# Patient Record
Sex: Female | Born: 1945
Health system: Southern US, Community
[De-identification: ages and names within clinical notes are randomized; demographics above are authoritative.]

## PROBLEM LIST (undated history)

## (undated) ENCOUNTER — Emergency Department (HOSPITAL_BASED_OUTPATIENT_CLINIC_OR_DEPARTMENT_OTHER): Admission: EM

## (undated) DIAGNOSIS — Z720 Tobacco use: Secondary | ICD-10-CM

## (undated) DIAGNOSIS — M545 Low back pain, unspecified: Secondary | ICD-10-CM

## (undated) DIAGNOSIS — L732 Hidradenitis suppurativa: Secondary | ICD-10-CM

## (undated) DIAGNOSIS — M1990 Unspecified osteoarthritis, unspecified site: Secondary | ICD-10-CM

## (undated) DIAGNOSIS — E7800 Pure hypercholesterolemia, unspecified: Secondary | ICD-10-CM

## (undated) DIAGNOSIS — I2510 Atherosclerotic heart disease of native coronary artery without angina pectoris: Secondary | ICD-10-CM

## (undated) DIAGNOSIS — I10 Essential (primary) hypertension: Secondary | ICD-10-CM

## (undated) DIAGNOSIS — E669 Obesity, unspecified: Secondary | ICD-10-CM

## (undated) DIAGNOSIS — I700 Atherosclerosis of aorta: Secondary | ICD-10-CM

## (undated) DIAGNOSIS — K219 Gastro-esophageal reflux disease without esophagitis: Secondary | ICD-10-CM

## (undated) DIAGNOSIS — C801 Malignant (primary) neoplasm, unspecified: Secondary | ICD-10-CM

## (undated) DIAGNOSIS — E119 Type 2 diabetes mellitus without complications: Secondary | ICD-10-CM

## (undated) DIAGNOSIS — E785 Hyperlipidemia, unspecified: Secondary | ICD-10-CM

## (undated) DIAGNOSIS — R0600 Dyspnea, unspecified: Secondary | ICD-10-CM

## (undated) DIAGNOSIS — G8929 Other chronic pain: Secondary | ICD-10-CM

## (undated) DIAGNOSIS — G4730 Sleep apnea, unspecified: Secondary | ICD-10-CM

## (undated) HISTORY — DX: Gastro-esophageal reflux disease without esophagitis: K21.9

## (undated) HISTORY — DX: Obesity, unspecified: E66.9

## (undated) HISTORY — DX: Unspecified osteoarthritis, unspecified site: M19.90

## (undated) HISTORY — PX: HERNIA REPAIR: SHX51

## (undated) HISTORY — DX: Pure hypercholesterolemia, unspecified: E78.00

## (undated) HISTORY — DX: Hidradenitis suppurativa: L73.2

## (undated) HISTORY — DX: Essential (primary) hypertension: I10

## (undated) HISTORY — DX: Type 2 diabetes mellitus without complications: E11.9

## (undated) HISTORY — DX: Hyperlipidemia, unspecified: E78.5

## (undated) HISTORY — DX: Atherosclerosis of aorta: I70.0

## (undated) HISTORY — DX: Tobacco use: Z72.0

---
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
## 1999-11-08 ENCOUNTER — Ambulatory Visit (HOSPITAL_COMMUNITY): Admission: RE | Admit: 1999-11-08 | Discharge: 1999-11-08 | Payer: Self-pay | Admitting: Family Medicine

## 1999-11-08 ENCOUNTER — Encounter: Payer: Self-pay | Admitting: Family Medicine

## 1999-11-14 ENCOUNTER — Encounter: Payer: Self-pay | Admitting: Family Medicine

## 1999-11-14 ENCOUNTER — Encounter: Admission: RE | Admit: 1999-11-14 | Discharge: 1999-11-14 | Payer: Self-pay | Admitting: Family Medicine

## 2001-05-19 ENCOUNTER — Encounter: Admission: RE | Admit: 2001-05-19 | Discharge: 2001-05-19 | Payer: Self-pay | Admitting: Family Medicine

## 2001-05-19 ENCOUNTER — Encounter: Payer: Self-pay | Admitting: Family Medicine

## 2001-06-01 ENCOUNTER — Ambulatory Visit (HOSPITAL_COMMUNITY): Admission: RE | Admit: 2001-06-01 | Discharge: 2001-06-01 | Payer: Self-pay | Admitting: Family Medicine

## 2001-06-01 ENCOUNTER — Encounter: Payer: Self-pay | Admitting: Family Medicine

## 2001-07-12 ENCOUNTER — Emergency Department (HOSPITAL_COMMUNITY): Admission: EM | Admit: 2001-07-12 | Discharge: 2001-07-13 | Payer: Self-pay

## 2001-12-08 ENCOUNTER — Other Ambulatory Visit: Admission: RE | Admit: 2001-12-08 | Discharge: 2001-12-08 | Payer: Self-pay | Admitting: Family Medicine

## 2004-03-27 ENCOUNTER — Other Ambulatory Visit: Admission: RE | Admit: 2004-03-27 | Discharge: 2004-03-27 | Payer: Self-pay | Admitting: Family Medicine

## 2005-12-04 ENCOUNTER — Other Ambulatory Visit: Admission: RE | Admit: 2005-12-04 | Discharge: 2005-12-04 | Payer: Self-pay | Admitting: Family Medicine

## 2005-12-23 ENCOUNTER — Encounter: Admission: RE | Admit: 2005-12-23 | Discharge: 2005-12-23 | Payer: Self-pay | Admitting: Family Medicine

## 2010-05-03 ENCOUNTER — Other Ambulatory Visit: Payer: Self-pay | Admitting: Family Medicine

## 2010-05-03 ENCOUNTER — Other Ambulatory Visit (HOSPITAL_COMMUNITY)
Admission: RE | Admit: 2010-05-03 | Discharge: 2010-05-03 | Disposition: A | Discharge status: Home / Self Care | Payer: Medicare Other | Source: Ambulatory Visit | Attending: Family Medicine | Admitting: Family Medicine

## 2010-05-03 DIAGNOSIS — Z124 Encounter for screening for malignant neoplasm of cervix: Secondary | ICD-10-CM | POA: Insufficient documentation

## 2010-07-08 ENCOUNTER — Other Ambulatory Visit: Payer: Self-pay | Admitting: Gastroenterology

## 2010-11-09 ENCOUNTER — Emergency Department (HOSPITAL_COMMUNITY)
Admission: EM | Admit: 2010-11-09 | Discharge: 2010-11-10 | Disposition: A | Discharge status: Home / Self Care | Payer: Medicare Other | Attending: Emergency Medicine | Admitting: Emergency Medicine

## 2010-11-09 DIAGNOSIS — M79609 Pain in unspecified limb: Secondary | ICD-10-CM | POA: Insufficient documentation

## 2010-11-09 DIAGNOSIS — Z79899 Other long term (current) drug therapy: Secondary | ICD-10-CM | POA: Insufficient documentation

## 2010-11-09 DIAGNOSIS — E7800 Pure hypercholesterolemia, unspecified: Secondary | ICD-10-CM | POA: Insufficient documentation

## 2010-11-09 DIAGNOSIS — I10 Essential (primary) hypertension: Secondary | ICD-10-CM | POA: Insufficient documentation

## 2010-11-10 LAB — D-DIMER, QUANTITATIVE (NOT AT ARMC): D-Dimer, Quant: 0.33 ug/mL-FEU (ref 0.00–0.48)

## 2011-02-14 ENCOUNTER — Ambulatory Visit
Admission: RE | Admit: 2011-02-14 | Discharge: 2011-02-14 | Disposition: A | Discharge status: Home / Self Care | Payer: Medicare Other | Source: Ambulatory Visit | Attending: Family Medicine | Admitting: Family Medicine

## 2011-02-14 ENCOUNTER — Other Ambulatory Visit: Payer: Self-pay | Admitting: Family Medicine

## 2011-02-14 DIAGNOSIS — M545 Low back pain, unspecified: Secondary | ICD-10-CM

## 2011-03-06 ENCOUNTER — Ambulatory Visit: Payer: Medicare Other | Attending: Family Medicine

## 2011-03-06 DIAGNOSIS — IMO0001 Reserved for inherently not codable concepts without codable children: Secondary | ICD-10-CM | POA: Insufficient documentation

## 2011-03-06 DIAGNOSIS — R5381 Other malaise: Secondary | ICD-10-CM | POA: Insufficient documentation

## 2011-03-06 DIAGNOSIS — M545 Low back pain, unspecified: Secondary | ICD-10-CM | POA: Insufficient documentation

## 2011-03-13 ENCOUNTER — Ambulatory Visit: Payer: Medicare Other | Attending: Family Medicine

## 2011-03-13 DIAGNOSIS — R5381 Other malaise: Secondary | ICD-10-CM | POA: Insufficient documentation

## 2011-03-13 DIAGNOSIS — M545 Low back pain, unspecified: Secondary | ICD-10-CM | POA: Insufficient documentation

## 2011-03-13 DIAGNOSIS — IMO0001 Reserved for inherently not codable concepts without codable children: Secondary | ICD-10-CM | POA: Insufficient documentation

## 2011-03-26 ENCOUNTER — Encounter: Payer: Medicare Other | Admitting: Physical Therapy

## 2011-06-12 ENCOUNTER — Other Ambulatory Visit: Payer: Self-pay | Admitting: Family Medicine

## 2011-06-12 DIAGNOSIS — Z1231 Encounter for screening mammogram for malignant neoplasm of breast: Secondary | ICD-10-CM

## 2011-06-16 ENCOUNTER — Encounter (INDEPENDENT_AMBULATORY_CARE_PROVIDER_SITE_OTHER): Payer: Self-pay | Admitting: General Surgery

## 2011-06-17 ENCOUNTER — Ambulatory Visit (INDEPENDENT_AMBULATORY_CARE_PROVIDER_SITE_OTHER): Payer: Medicare Other | Admitting: General Surgery

## 2011-06-18 ENCOUNTER — Ambulatory Visit (INDEPENDENT_AMBULATORY_CARE_PROVIDER_SITE_OTHER): Payer: Medicare Other | Admitting: General Surgery

## 2011-06-18 ENCOUNTER — Encounter (INDEPENDENT_AMBULATORY_CARE_PROVIDER_SITE_OTHER): Payer: Self-pay | Admitting: General Surgery

## 2011-06-18 VITALS — BP 140/74 | HR 78 | Temp 98.3°F | Ht 65.5 in | Wt 223.2 lb

## 2011-06-18 DIAGNOSIS — K439 Ventral hernia without obstruction or gangrene: Secondary | ICD-10-CM

## 2011-06-18 DIAGNOSIS — R109 Unspecified abdominal pain: Secondary | ICD-10-CM

## 2011-06-18 NOTE — Progress Notes (Signed)
Addended by: Andrey Campanile, Akshitha Culmer M on: 06/18/2011 10:30 AM   Modules accepted: Orders

## 2011-06-18 NOTE — Progress Notes (Signed)
Patient ID: Kathryn Bruce, female   DOB: 01-Oct-1945, 66 y.o.   MRN: [id number]  Chief Complaint  Patient presents with  . Pre-op Exam    eval abd wall hernia    HPI Kathryn Bruce is a 66 y.o. female.   HPI This 66 year old obese Caucasian female referred by Dr. Kevan Ny for evaluation of a large ventral hernia. The patient states that she's had a hernia in this area for about 10 years. It did not bother her until about 3 weeks ago. She awokei in the middle the night with a painful lump above her umbilicus. It was tender. She denies any nausea, vomiting, diarrhea, or constipation. She went to see her primary care physician the following morning and was able to reduce it. She denies any additional episode since that time. She denies any melena or hematochezia. She denies any weight loss. She denies any prior abdominal surgery. She currently smokes about half a pack of cigarettes per day. Past Medical History  Diagnosis Date  . Hypercholesteremia   . Depression   . GERD (gastroesophageal reflux disease)   . Hidradenitis   . Hypertension   . Osteoarthritis (arthritis due to wear and tear of joints)     right knee  . Arthritis   . Heart murmur     Past Surgical History  Procedure Date  . Tonsillectomy 1963    Family History  Problem Relation Age of Onset  . Cancer Sister     lung  . Cancer Brother     lung  . Cancer Maternal Grandfather     colon    Social History History  Substance Use Topics  . Smoking status: Current Everyday Smoker -- 0.5 packs/day    Types: Cigarettes  . Smokeless tobacco: Not on file  . Alcohol Use: No    No Known Allergies  Current Outpatient Prescriptions  Medication Sig Dispense Refill  . acetaminophen (TYLENOL) 500 MG tablet Take 500 mg by mouth as needed.      Marland Kitchen amLODipine (NORVASC) 5 MG tablet Take 5 mg by mouth daily.      Marland Kitchen aspirin 81 MG tablet Take 81 mg by mouth daily.      Marland Kitchen lisinopril-hydrochlorothiazide (PRINZIDE,ZESTORETIC) 20-25  MG per tablet Take 1 tablet by mouth daily.      Marland Kitchen omeprazole (PRILOSEC) 20 MG capsule Take 20 mg by mouth daily.      . simvastatin (ZOCOR) 80 MG tablet Take 80 mg by mouth at bedtime.        Review of Systems Review of Systems  Constitutional: Negative for fever, chills and unexpected weight change.  HENT: Negative for hearing loss, congestion, sore throat, trouble swallowing and voice change.   Eyes: Negative for photophobia and visual disturbance.  Respiratory: Negative for cough, chest tightness, shortness of breath and wheezing.        Denies CP, SOB, DOE, orthopnea, PND  Cardiovascular: Negative for chest pain, palpitations and leg swelling.  Gastrointestinal: Positive for abdominal pain and abdominal distention. Negative for nausea, vomiting, diarrhea, constipation, blood in stool and anal bleeding.  Genitourinary: Negative for dysuria, hematuria, vaginal bleeding and difficulty urinating.  Musculoskeletal: Positive for back pain and arthralgias.       Joint pains  Skin: Negative for rash and wound.  Neurological: Negative for dizziness, seizures, syncope, facial asymmetry and headaches.  Hematological: Negative for adenopathy. Does not bruise/bleed easily.  Psychiatric/Behavioral: Negative for confusion.    Blood pressure 140/74, pulse 78, temperature 98.3 F (  36.8 C), temperature source Temporal, height 5' 5.5" (1.664 m), weight 223 lb 3.2 oz (101.243 kg), SpO2 97.00%.  Physical Exam Physical Exam  Vitals reviewed. Constitutional: She is oriented to person, place, and time. She appears well-developed and well-nourished. No distress.  HENT:  Head: Normocephalic and atraumatic.  Right Ear: External ear normal.  Left Ear: External ear normal.  Nose: Nose normal.  Eyes: Conjunctivae are normal. No scleral icterus.  Neck: Normal range of motion. Neck supple. No tracheal deviation present. No thyromegaly present.  Cardiovascular: Normal rate and normal heart sounds.     Pulmonary/Chest: Effort normal and breath sounds normal. No respiratory distress. She has no wheezes.  Abdominal: Soft. Bowel sounds are normal. There is tenderness (mild TTP in upper midline slightly to left). There is no rigidity, no rebound and no guarding. A hernia is present. Hernia confirmed positive in the ventral area.         Bulge in subcu tissues slightly to left; about 5 x 5cm. Exam somewhat limited by body habitus  Musculoskeletal: Normal range of motion. She exhibits no edema and no tenderness.       Walks slowly  Neurological: She is alert and oriented to person, place, and time. She exhibits normal muscle tone.  Skin: Skin is warm and dry. No rash noted. She is not diaphoretic. No erythema.  Psychiatric: Her behavior is normal. Judgment and thought content normal.    Data Reviewed Dr Meredith Leeds note  Assessment    Symptomatic Ventral hernia Obesity  Tobacco use    Plan    We discussed the etiology of ventral hernias. We discussed the signs and symptoms of incarceration and strangulation. The patient was given educational material. I also drew diagrams.  We discussed nonoperative and operative management. With respect to operative management, we discussed both open repair and laparoscopic repair. We discussed the pros and cons of each approach. I discussed the typical aftercare with each procedure and how each procedure differs.  I explained that she is at higher risk for complications (infection, hernia recurrence) given her obesity and smoking use. Since she has had an episode of what sounds like incarceration, I believe she is at a higher risk for recurrent incarceration in the near future. I have recommended a laparoscopic approach which will have smaller incisions than an open repair which should be a lower risk of infection given her comorbidities.   The patient has elected to have LAPAROSCOPIC VENTRAL HERNIA REPAIR WITH MESH  We discussed the risk and benefits of  surgery including but not limited to bleeding, infection, injury to surrounding structures, hernia recurrence, mesh complications, hematoma/seroma formation, need to convert to an open procedure, blood clot formation, urinary retention, post operative ileus, general anesthesia risk, long-term abdominal pain. We discussed that this procedure can be quite uncomfortable and difficult to recover from based on how the mesh is secured to the abdominal wall. We discussed the importance of avoiding heavy lifting and straining for a period of 6 weeks.  We will get a CT scan of her abdomen/pelvis to help delineate her abdominal wall anatomy for surgical planning.   She has a trip at the end of May to [place]. We may end doing her surgery after her trip as I suggested she probably wont feel like traveling within 2 weeks of surgery.   [name]. Andrey Campanile, MD, FACS General, Bariatric, & Minimally Invasive Surgery [facility], [place]         [facility] M 06/18/2011, 10:01 AM

## 2011-06-19 ENCOUNTER — Ambulatory Visit: Payer: Medicare Other

## 2011-06-25 ENCOUNTER — Other Ambulatory Visit: Payer: Medicare Other

## 2011-07-02 ENCOUNTER — Other Ambulatory Visit: Payer: Medicare Other

## 2011-07-15 ENCOUNTER — Ambulatory Visit
Admission: RE | Admit: 2011-07-15 | Discharge: 2011-07-15 | Disposition: A | Discharge status: Home / Self Care | Payer: Medicare Other | Source: Ambulatory Visit | Attending: General Surgery | Admitting: General Surgery

## 2011-07-15 MED ORDER — IOHEXOL 300 MG/ML  SOLN
125.0000 mL | Freq: Once | INTRAMUSCULAR | Status: AC | PRN
  Administered 2011-07-15: 125 mL via INTRAVENOUS

## 2011-08-18 ENCOUNTER — Other Ambulatory Visit: Payer: Medicare Other

## 2011-08-22 ENCOUNTER — Encounter (HOSPITAL_COMMUNITY): Payer: Self-pay | Admitting: Pharmacy Technician

## 2011-08-25 ENCOUNTER — Encounter (HOSPITAL_COMMUNITY): Payer: Self-pay

## 2011-08-25 ENCOUNTER — Ambulatory Visit (HOSPITAL_COMMUNITY)
Admission: RE | Admit: 2011-08-25 | Discharge: 2011-08-25 | Disposition: A | Discharge status: Home / Self Care | Payer: Medicare Other | Source: Ambulatory Visit | Attending: General Surgery | Admitting: General Surgery

## 2011-08-25 ENCOUNTER — Encounter (HOSPITAL_COMMUNITY)
Admission: RE | Admit: 2011-08-25 | Discharge: 2011-08-25 | Disposition: A | Discharge status: Home / Self Care | Payer: Medicare Other | Source: Ambulatory Visit | Attending: General Surgery | Admitting: General Surgery

## 2011-08-25 DIAGNOSIS — K439 Ventral hernia without obstruction or gangrene: Secondary | ICD-10-CM | POA: Insufficient documentation

## 2011-08-25 DIAGNOSIS — Z01810 Encounter for preprocedural cardiovascular examination: Secondary | ICD-10-CM | POA: Insufficient documentation

## 2011-08-25 DIAGNOSIS — Z01812 Encounter for preprocedural laboratory examination: Secondary | ICD-10-CM | POA: Insufficient documentation

## 2011-08-25 HISTORY — DX: Malignant (primary) neoplasm, unspecified: C80.1

## 2011-08-25 HISTORY — DX: Sleep apnea, unspecified: G47.30

## 2011-08-25 LAB — BASIC METABOLIC PANEL
BUN: 22 mg/dL (ref 6–23)
Chloride: 102 mEq/L (ref 96–112)
GFR calc Af Amer: >90 mL/min (ref >90)
GFR calc non Af Amer: 87 mL/min — ABNORMAL LOW (ref >90)
Potassium: 4.3 mEq/L (ref 3.5–5.1)
Sodium: 140 mEq/L (ref 135–145)

## 2011-08-25 LAB — CBC
HCT: 43.2 % (ref 36.0–46.0)
Hemoglobin: 14.5 g/dL (ref 12.0–15.0)
MCHC: 33.6 g/dL (ref 30.0–36.0)
RBC: 4.86 MIL/uL (ref 3.87–5.11)
WBC: 10.6 10*3/uL — ABNORMAL HIGH (ref 4.0–10.5)

## 2011-08-25 LAB — DIFFERENTIAL
Basophils Relative: 0 % (ref 0–1)
Eosinophils Absolute: 0.1 10*3/uL (ref 0.0–0.7)
Eosinophils Relative: 1 % (ref 0–5)
Lymphocytes Relative: 32 % (ref 12–46)
Monocytes Relative: 6 % (ref 3–12)
Neutro Abs: 6.5 10*3/uL (ref 1.7–7.7)
Neutrophils Relative %: 61 % (ref 43–77)

## 2011-08-25 LAB — SURGICAL PCR SCREEN
MRSA, PCR: NEGATIVE
Staphylococcus aureus: POSITIVE — AB

## 2011-08-25 MED ORDER — CHLORHEXIDINE GLUCONATE 4 % EX LIQD
1.0000 "application " | Freq: Once | CUTANEOUS | Status: DC
  Filled 2011-08-25: qty 15

## 2011-08-25 NOTE — Progress Notes (Signed)
08/25/11 1546  OBSTRUCTIVE SLEEP APNEA  Have you ever been diagnosed with sleep apnea through a sleep study? No  Do you snore loudly (loud enough to be heard through closed doors)?  1  Do you often feel tired, fatigued, or sleepy during the daytime? 1  Has anyone observed you stop breathing during your sleep? 0  Do you have, or are you being treated for high blood pressure? 1  BMI more than 35 kg/m2? 1  Age over 67 years old? 1  Neck circumference greater than 40 cm/18 inches? 0  Gender: 0  Obstructive Sleep Apnea Score 5   Score 4 or greater  Updated health history;Results sent to PCP

## 2011-08-25 NOTE — Patient Instructions (Addendum)
20 FRITZI SCRIPTER  08/25/2011   Your procedure is scheduled on:  09/04/11  Report to SHORT STAY DEPT  at 5:30 AM.  Call this number if you have problems the morning of surgery: [phone]   Remember:   Do not eat food or drink liquids AFTER MIDNIGHT    Take these medicines the morning of surgery with A SIP OF WATER: NONE   Do not wear jewelry, make-up or nail polish.  Do not wear lotions, powders, or perfumes.   Do not shave legs or underarms 48 hrs. before surgery (men may shave face)  Do not bring valuables to the hospital.  Contacts, dentures or bridgework may not be worn into surgery.  Leave suitcase in the car. After surgery it may be brought to your room.  For patients admitted to the hospital, checkout time is 11:00 AM the day of discharge.   Patients discharged the day of surgery will not be allowed to drive home.    Special Instructions:   Please read over the following fact sheets that you were given: MRSA  Information               SHOWER WITH BETASEPT THE NIGHT BEFORE SURGERY AND THE MORNING OF SURGERY          DISCONTINUE ASPIRIN AND HERBAL MEDICATION 7 DAYS PREOP

## 2011-09-04 ENCOUNTER — Encounter (HOSPITAL_COMMUNITY): Payer: Self-pay | Admitting: Anesthesiology

## 2011-09-04 ENCOUNTER — Encounter (HOSPITAL_COMMUNITY)
Admission: RE | Disposition: A | Discharge status: Home / Self Care | Payer: Self-pay | Source: Ambulatory Visit | Attending: General Surgery

## 2011-09-04 ENCOUNTER — Ambulatory Visit (HOSPITAL_COMMUNITY)
Admission: RE | Admit: 2011-09-04 | Discharge: 2011-09-07 | Disposition: A | Discharge status: Home / Self Care | Payer: Medicare Other | Source: Ambulatory Visit | Attending: General Surgery | Admitting: General Surgery

## 2011-09-04 ENCOUNTER — Encounter (HOSPITAL_COMMUNITY): Payer: Self-pay | Admitting: *Deleted

## 2011-09-04 ENCOUNTER — Ambulatory Visit (HOSPITAL_COMMUNITY): Payer: Medicare Other | Admitting: Anesthesiology

## 2011-09-04 DIAGNOSIS — K436 Other and unspecified ventral hernia with obstruction, without gangrene: Secondary | ICD-10-CM

## 2011-09-04 DIAGNOSIS — E7800 Pure hypercholesterolemia, unspecified: Secondary | ICD-10-CM | POA: Insufficient documentation

## 2011-09-04 DIAGNOSIS — K439 Ventral hernia without obstruction or gangrene: Secondary | ICD-10-CM

## 2011-09-04 DIAGNOSIS — Z79899 Other long term (current) drug therapy: Secondary | ICD-10-CM | POA: Insufficient documentation

## 2011-09-04 DIAGNOSIS — Z01812 Encounter for preprocedural laboratory examination: Secondary | ICD-10-CM | POA: Insufficient documentation

## 2011-09-04 DIAGNOSIS — Z720 Tobacco use: Secondary | ICD-10-CM | POA: Diagnosis present

## 2011-09-04 DIAGNOSIS — K219 Gastro-esophageal reflux disease without esophagitis: Secondary | ICD-10-CM | POA: Insufficient documentation

## 2011-09-04 DIAGNOSIS — R7309 Other abnormal glucose: Secondary | ICD-10-CM | POA: Insufficient documentation

## 2011-09-04 DIAGNOSIS — I10 Essential (primary) hypertension: Secondary | ICD-10-CM | POA: Diagnosis present

## 2011-09-04 DIAGNOSIS — Z7982 Long term (current) use of aspirin: Secondary | ICD-10-CM | POA: Insufficient documentation

## 2011-09-04 DIAGNOSIS — G4730 Sleep apnea, unspecified: Secondary | ICD-10-CM | POA: Insufficient documentation

## 2011-09-04 HISTORY — PX: VENTRAL HERNIA REPAIR: SHX424

## 2011-09-04 LAB — GLUCOSE, CAPILLARY

## 2011-09-04 SURGERY — REPAIR, HERNIA, VENTRAL, LAPAROSCOPIC
Anesthesia: General | Site: Abdomen | Wound class: Clean

## 2011-09-04 MED ORDER — MORPHINE SULFATE 2 MG/ML IJ SOLN
1.0000 mg | INTRAMUSCULAR | Status: DC | PRN
  Administered 2011-09-04 – 2011-09-05 (×3): 2 mg via INTRAVENOUS
  Filled 2011-09-04 (×4): qty 1

## 2011-09-04 MED ORDER — NICOTINE 14 MG/24HR TD PT24
14.0000 mg | MEDICATED_PATCH | Freq: Every day | TRANSDERMAL | Status: DC
  Administered 2011-09-04 – 2011-09-07 (×4): 14 mg via TRANSDERMAL
  Filled 2011-09-04 (×4): qty 1

## 2011-09-04 MED ORDER — ACETAMINOPHEN 10 MG/ML IV SOLN
INTRAVENOUS | Status: AC
  Filled 2011-09-04: qty 100

## 2011-09-04 MED ORDER — AMLODIPINE BESYLATE 5 MG PO TABS
5.0000 mg | ORAL_TABLET | Freq: Every day | ORAL | Status: DC
  Administered 2011-09-05 – 2011-09-07 (×2): 5 mg via ORAL
  Filled 2011-09-04 (×4): qty 1

## 2011-09-04 MED ORDER — ENOXAPARIN SODIUM 40 MG/0.4ML ~~LOC~~ SOLN
40.0000 mg | SUBCUTANEOUS | Status: DC
  Administered 2011-09-05 – 2011-09-07 (×3): 40 mg via SUBCUTANEOUS
  Filled 2011-09-04 (×4): qty 0.4

## 2011-09-04 MED ORDER — HYDROMORPHONE HCL PF 1 MG/ML IJ SOLN
0.2500 mg | INTRAMUSCULAR | Status: DC | PRN
  Administered 2011-09-04 (×4): 0.5 mg via INTRAVENOUS

## 2011-09-04 MED ORDER — ROCURONIUM BROMIDE 100 MG/10ML IV SOLN
INTRAVENOUS | Status: DC | PRN
  Administered 2011-09-04: 25 mg via INTRAVENOUS
  Administered 2011-09-04: 5 mg via INTRAVENOUS
  Administered 2011-09-04 (×2): 10 mg via INTRAVENOUS

## 2011-09-04 MED ORDER — BUPIVACAINE 0.25 % ON-Q PUMP DUAL CATH 300 ML
300.0000 mL | INJECTION | Status: DC
  Filled 2011-09-04: qty 300

## 2011-09-04 MED ORDER — LISINOPRIL-HYDROCHLOROTHIAZIDE 20-25 MG PO TABS: 1.0000 | ORAL_TABLET | Freq: Every day | ORAL | Status: DC

## 2011-09-04 MED ORDER — KCL IN DEXTROSE-NACL 20-5-0.45 MEQ/L-%-% IV SOLN
INTRAVENOUS | Status: DC
  Administered 2011-09-04 (×2): via INTRAVENOUS
  Administered 2011-09-04: 100 mL/h via INTRAVENOUS
  Administered 2011-09-05 – 2011-09-06 (×3): via INTRAVENOUS
  Filled 2011-09-04 (×8): qty 1000

## 2011-09-04 MED ORDER — CEFAZOLIN SODIUM-DEXTROSE 2-3 GM-% IV SOLR
2.0000 g | INTRAVENOUS | Status: AC
  Administered 2011-09-04: 2 g via INTRAVENOUS

## 2011-09-04 MED ORDER — LACTATED RINGERS IV SOLN: INTRAVENOUS | Status: DC

## 2011-09-04 MED ORDER — DOCUSATE SODIUM 100 MG PO CAPS
100.0000 mg | ORAL_CAPSULE | Freq: Two times a day (BID) | ORAL | Status: DC
  Administered 2011-09-04 – 2011-09-07 (×6): 100 mg via ORAL

## 2011-09-04 MED ORDER — LACTATED RINGERS IV SOLN
INTRAVENOUS | Status: DC | PRN
  Administered 2011-09-04 (×2): via INTRAVENOUS

## 2011-09-04 MED ORDER — HYDROMORPHONE HCL PF 1 MG/ML IJ SOLN
INTRAMUSCULAR | Status: AC
  Filled 2011-09-04: qty 2

## 2011-09-04 MED ORDER — ACETAMINOPHEN 10 MG/ML IV SOLN
1000.0000 mg | Freq: Four times a day (QID) | INTRAVENOUS | Status: AC
  Administered 2011-09-04 (×3): 1000 mg via INTRAVENOUS
  Filled 2011-09-04 (×4): qty 100

## 2011-09-04 MED ORDER — CEFAZOLIN SODIUM 1-5 GM-% IV SOLN
INTRAVENOUS | Status: AC
  Filled 2011-09-04: qty 100

## 2011-09-04 MED ORDER — FENTANYL CITRATE 0.05 MG/ML IJ SOLN
INTRAMUSCULAR | Status: DC | PRN
  Administered 2011-09-04: 50 ug via INTRAVENOUS
  Administered 2011-09-04: 100 ug via INTRAVENOUS
  Administered 2011-09-04 (×2): 50 ug via INTRAVENOUS

## 2011-09-04 MED ORDER — OXYCODONE HCL 5 MG PO TABS
5.0000 mg | ORAL_TABLET | ORAL | Status: DC | PRN
  Administered 2011-09-04: 5 mg via ORAL
  Administered 2011-09-04 – 2011-09-07 (×18): 10 mg via ORAL
  Filled 2011-09-04 (×12): qty 2
  Filled 2011-09-04: qty 1
  Filled 2011-09-04 (×3): qty 2
  Filled 2011-09-04: qty 1
  Filled 2011-09-04 (×3): qty 2

## 2011-09-04 MED ORDER — GLYCOPYRROLATE 0.2 MG/ML IJ SOLN
INTRAMUSCULAR | Status: DC | PRN
  Administered 2011-09-04: 0.6 mg via INTRAVENOUS

## 2011-09-04 MED ORDER — LIDOCAINE HCL (CARDIAC) 20 MG/ML IV SOLN
INTRAVENOUS | Status: DC | PRN
  Administered 2011-09-04: 80 mg via INTRAVENOUS

## 2011-09-04 MED ORDER — SUCCINYLCHOLINE CHLORIDE 20 MG/ML IJ SOLN
INTRAMUSCULAR | Status: DC | PRN
  Administered 2011-09-04: 100 mg via INTRAVENOUS

## 2011-09-04 MED ORDER — HYDROMORPHONE HCL PF 1 MG/ML IJ SOLN
INTRAMUSCULAR | Status: AC
  Filled 2011-09-04: qty 1

## 2011-09-04 MED ORDER — KCL IN DEXTROSE-NACL 20-5-0.45 MEQ/L-%-% IV SOLN
INTRAVENOUS | Status: AC
  Administered 2011-09-04: 100 mL/h via INTRAVENOUS
  Filled 2011-09-04: qty 1000

## 2011-09-04 MED ORDER — LISINOPRIL 20 MG PO TABS
20.0000 mg | ORAL_TABLET | Freq: Every day | ORAL | Status: DC
  Administered 2011-09-05 – 2011-09-07 (×2): 20 mg via ORAL
  Filled 2011-09-04 (×4): qty 1

## 2011-09-04 MED ORDER — PROMETHAZINE HCL 25 MG/ML IJ SOLN: 6.2500 mg | INTRAMUSCULAR | Status: DC | PRN

## 2011-09-04 MED ORDER — MIDAZOLAM HCL 5 MG/5ML IJ SOLN
INTRAMUSCULAR | Status: DC | PRN
  Administered 2011-09-04: 2 mg via INTRAVENOUS

## 2011-09-04 MED ORDER — BUPIVACAINE-EPINEPHRINE 0.25% -1:200000 IJ SOLN
INTRAMUSCULAR | Status: AC
  Filled 2011-09-04: qty 1

## 2011-09-04 MED ORDER — EPHEDRINE SULFATE 50 MG/ML IJ SOLN
INTRAMUSCULAR | Status: DC | PRN
  Administered 2011-09-04: 5 mg via INTRAVENOUS

## 2011-09-04 MED ORDER — NEOSTIGMINE METHYLSULFATE 1 MG/ML IJ SOLN
INTRAMUSCULAR | Status: DC | PRN
  Administered 2011-09-04: 4 mg via INTRAVENOUS

## 2011-09-04 MED ORDER — PROPOFOL 10 MG/ML IV BOLUS
INTRAVENOUS | Status: DC | PRN
  Administered 2011-09-04: 180 mg via INTRAVENOUS

## 2011-09-04 MED ORDER — ACETAMINOPHEN 10 MG/ML IV SOLN
INTRAVENOUS | Status: DC | PRN
  Administered 2011-09-04: 1000 mg via INTRAVENOUS

## 2011-09-04 MED ORDER — HYDROMORPHONE HCL PF 1 MG/ML IJ SOLN
0.5000 mg | INTRAMUSCULAR | Status: DC | PRN
  Administered 2011-09-04 (×2): 0.5 mg via INTRAVENOUS

## 2011-09-04 MED ORDER — HYDROCHLOROTHIAZIDE 25 MG PO TABS
25.0000 mg | ORAL_TABLET | Freq: Every day | ORAL | Status: DC
  Administered 2011-09-05 – 2011-09-07 (×2): 25 mg via ORAL
  Filled 2011-09-04 (×4): qty 1

## 2011-09-04 MED ORDER — PANTOPRAZOLE SODIUM 40 MG IV SOLR
40.0000 mg | INTRAVENOUS | Status: DC
  Administered 2011-09-04 – 2011-09-06 (×3): 40 mg via INTRAVENOUS
  Filled 2011-09-04 (×4): qty 40

## 2011-09-04 MED ORDER — ONDANSETRON HCL 4 MG/2ML IJ SOLN
INTRAMUSCULAR | Status: DC | PRN
  Administered 2011-09-04: 4 mg via INTRAVENOUS

## 2011-09-04 MED ORDER — LACTATED RINGERS IR SOLN
Status: DC | PRN
  Administered 2011-09-04: 1000 mL

## 2011-09-04 MED ORDER — ONDANSETRON HCL 4 MG/2ML IJ SOLN: 4.0000 mg | Freq: Four times a day (QID) | INTRAMUSCULAR | Status: DC | PRN

## 2011-09-04 MED ORDER — BLISTEX EX OINT
TOPICAL_OINTMENT | CUTANEOUS | Status: AC
  Administered 2011-09-04: 15:00:00
  Filled 2011-09-04: qty 10

## 2011-09-04 MED ORDER — BUPIVACAINE 0.25 % ON-Q PUMP DUAL CATH 300 ML
INJECTION | Status: DC | PRN
  Administered 2011-09-04: 300 mL

## 2011-09-04 MED ORDER — BUPIVACAINE ON-Q PAIN PUMP (FOR ORDER SET NO CHG)
INJECTION | Status: AC
  Filled 2011-09-04: qty 1

## 2011-09-04 MED ORDER — BUPIVACAINE-EPINEPHRINE 0.25% -1:200000 IJ SOLN
INTRAMUSCULAR | Status: DC | PRN
  Administered 2011-09-04: 50 mL

## 2011-09-04 SURGICAL SUPPLY — 57 items
ADH SKN CLS APL DERMABOND .7 (GAUZE/BANDAGES/DRESSINGS) ×1
APL SKNCLS STERI-STRIP NONHPOA (GAUZE/BANDAGES/DRESSINGS)
APPLIER CLIP LOGIC TI 5 (MISCELLANEOUS) IMPLANT
APR CLP MED LRG 33X5 (MISCELLANEOUS)
BANDAGE ADHESIVE 1X3 (GAUZE/BANDAGES/DRESSINGS) IMPLANT
BENZOIN TINCTURE PRP APPL 2/3 (GAUZE/BANDAGES/DRESSINGS) IMPLANT
CABLE HIGH FREQUENCY MONO STRZ (ELECTRODE) ×1 IMPLANT
CANISTER SUCTION 2500CC (MISCELLANEOUS) ×2 IMPLANT
CHLORAPREP W/TINT 26ML (MISCELLANEOUS) ×2 IMPLANT
CLOTH BEACON ORANGE TIMEOUT ST (SAFETY) ×2 IMPLANT
DECANTER SPIKE VIAL GLASS SM (MISCELLANEOUS) ×2 IMPLANT
DERMABOND ADVANCED (GAUZE/BANDAGES/DRESSINGS) ×1
DERMABOND ADVANCED .7 DNX12 (GAUZE/BANDAGES/DRESSINGS) IMPLANT
DEVICE SECURE STRAP 25 ABSORB (INSTRUMENTS) ×2 IMPLANT
DEVICE TROCAR PUNCTURE CLOSURE (ENDOMECHANICALS) ×2 IMPLANT
DRAPE INCISE IOBAN 66X45 STRL (DRAPES) IMPLANT
DRAPE LAPAROSCOPIC ABDOMINAL (DRAPES) ×2 IMPLANT
DRAPE UTILITY XL STRL (DRAPES) ×2 IMPLANT
ELECT REM PT RETURN 9FT ADLT (ELECTROSURGICAL) ×2
ELECTRODE REM PT RTRN 9FT ADLT (ELECTROSURGICAL) ×1 IMPLANT
GLOVE BIO SURGEON STRL SZ7.5 (GLOVE) ×4 IMPLANT
GLOVE BIOGEL M STRL SZ7.5 (GLOVE) ×1 IMPLANT
GLOVE BIOGEL PI IND STRL 7.0 (GLOVE) ×1 IMPLANT
GLOVE BIOGEL PI INDICATOR 7.0 (GLOVE) ×1
GLOVE INDICATOR 8.0 STRL GRN (GLOVE) ×2 IMPLANT
GOWN STRL NON-REIN LRG LVL3 (GOWN DISPOSABLE) ×3 IMPLANT
GOWN STRL REIN XL XLG (GOWN DISPOSABLE) ×4 IMPLANT
KIT BASIN OR (CUSTOM PROCEDURE TRAY) ×2 IMPLANT
MESH PARIETEX 20X15 (Mesh General) ×1 IMPLANT
NDL INSUFFLATION 14GA 120MM (NEEDLE) IMPLANT
NDL SPNL 22GX3.5 QUINCKE BK (NEEDLE) ×1 IMPLANT
NEEDLE INSUFFLATION 14GA 120MM (NEEDLE) IMPLANT
NEEDLE SPNL 22GX3.5 QUINCKE BK (NEEDLE) ×2 IMPLANT
NS IRRIG 1000ML POUR BTL (IV SOLUTION) ×2 IMPLANT
PAIN PUMP ON-Q 400MLX5ML 5IN (MISCELLANEOUS) ×2 IMPLANT
PEN SKIN MARKING BROAD (MISCELLANEOUS) ×2 IMPLANT
SCALPEL HARMONIC ACE (MISCELLANEOUS) ×1 IMPLANT
SCISSORS LAP 5X35 DISP (ENDOMECHANICALS) IMPLANT
SET IRRIG TUBING LAPAROSCOPIC (IRRIGATION / IRRIGATOR) IMPLANT
SOLUTION ANTI FOG 6CC (MISCELLANEOUS) ×2 IMPLANT
STAPLER VISISTAT 35W (STAPLE) IMPLANT
STRIP CLOSURE SKIN 1/2X4 (GAUZE/BANDAGES/DRESSINGS) IMPLANT
SUT MNCRL AB 4-0 PS2 18 (SUTURE) ×2 IMPLANT
SUT NOVA NAB DX-16 0-1 5-0 T12 (SUTURE) ×3 IMPLANT
SUT NOVA NAB GS-21 0 18 T12 DT (SUTURE) ×3 IMPLANT
SUT PROLENE 1 CT 1 30 (SUTURE) IMPLANT
SUT PROLENE 2 0 CT 1 CR (SUTURE) IMPLANT
SUT PROLENE 2 0 SH DA (SUTURE) IMPLANT
TACKER 5MM HERNIA 3.5CML NAB (ENDOMECHANICALS) IMPLANT
TOWEL OR 17X26 10 PK STRL BLUE (TOWEL DISPOSABLE) ×2 IMPLANT
TRAY FOLEY CATH 14FRSI W/METER (CATHETERS) ×2 IMPLANT
TRAY LAP CHOLE (CUSTOM PROCEDURE TRAY) ×2 IMPLANT
TROCAR BLADELESS OPT 5 75 (ENDOMECHANICALS) ×3 IMPLANT
TROCAR XCEL BLUNT TIP 100MML (ENDOMECHANICALS) IMPLANT
TROCAR XCEL NON-BLD 11X100MML (ENDOMECHANICALS) ×2 IMPLANT
TUBING INSUFFLATION 10FT LAP (TUBING) ×2 IMPLANT
TUNNELER SHEATH ON-Q 16GX12 DP (PAIN MANAGEMENT) ×1 IMPLANT

## 2011-09-04 NOTE — Transfer of Care (Signed)
Immediate Anesthesia Transfer of Care Note  Patient: Kathryn Bruce  Procedure(s) Performed: Procedure(s) (LRB): LAPAROSCOPIC VENTRAL HERNIA (N/A) INSERTION OF MESH (N/A)  Patient Location: PACU  Anesthesia Type: General  Level of Consciousness: awake, alert  and oriented  Airway & Oxygen Therapy: Patient Spontanous Breathing and Patient connected to face mask oxygen  Post-op Assessment: Report given to PACU RN and Post -op Vital signs reviewed and stable  Post vital signs: Reviewed and stable  Complications: No apparent anesthesia complications

## 2011-09-04 NOTE — H&P (Signed)
Kathryn Bruce is an 66 y.o. female.   Chief Complaint: here for surgery HPI: This 66 year old obese Caucasian female referred by Dr. Kevan Ny for evaluation of a large ventral hernia. The patient states that she's had a hernia in this area for about 10 years. It did not bother her until about 3 months ago. She awokei in the middle the night with a painful lump above her umbilicus. It was tender. She denies any nausea, vomiting, diarrhea, or constipation. She went to see her primary care physician the following morning and was able to reduce it. She denies any additional episode since that time. She denies any melena or hematochezia. She denies any weight loss. She denies any prior abdominal surgery. She currently smokes about half a pack of cigarettes per day.  She is still smoking.  PMHx, PSHx, SOCHx, FAMHx, ALL reviewed and unchanged   Past Medical History  Diagnosis Date  . Hypercholesteremia   . GERD (gastroesophageal reflux disease)   . Hidradenitis   . Hypertension   . Osteoarthritis (arthritis due to wear and tear of joints)     right knee  . Arthritis   . Shortness of breath     WITH EXERTION  . Heart murmur   . Nocturia   . Diabetes mellitus     "PREDIABETIC"   . Bruises easily   . Cancer     SKIN CANCDER REMOVED FROM NOSE   . Sleep apnea     STOP BANG SCORE 5    Past Surgical History  Procedure Date  . Tonsillectomy 1963  . Dental surgery     Family History  Problem Relation Age of Onset  . Cancer Sister     lung  . Cancer Brother     lung  . Cancer Maternal Grandfather     colon   Social History:  reports that she has been smoking Cigarettes.  She has been smoking about .5 packs per day. She does not have any smokeless tobacco history on file. She reports that she does not drink alcohol or use illicit drugs.  Allergies: No Known Allergies  Medications Prior to Admission  Medication Sig Dispense Refill  . acetaminophen (TYLENOL) 500 MG tablet Take 500 mg by  mouth as needed. pain      . amLODipine (NORVASC) 5 MG tablet Take 5 mg by mouth daily.      Marland Kitchen aspirin 81 MG tablet Take 81 mg by mouth daily.      . calcium carbonate (TUMS - DOSED IN MG ELEMENTAL CALCIUM) 500 MG chewable tablet Chew 1 tablet by mouth daily.      Marland Kitchen lisinopril-hydrochlorothiazide (PRINZIDE,ZESTORETIC) 20-25 MG per tablet Take 1 tablet by mouth daily.      . simvastatin (ZOCOR) 80 MG tablet Take 80 mg by mouth at bedtime.        No results found for this or any previous visit (from the past 48 hour(s)). No results found.  Review of Systems  HENT: Negative for hearing loss.   Eyes: Negative for blurred vision and double vision.  Respiratory: Negative for shortness of breath.   Cardiovascular: Negative for chest pain, leg swelling and PND.       Some DOE  Genitourinary: Negative for dysuria and urgency.  Musculoskeletal: Positive for back pain.  Neurological: Negative for focal weakness, seizures and loss of consciousness.       Denies TIA, amaurosis fugax  Psychiatric/Behavioral: Negative for suicidal ideas.    Blood pressure 141/64, pulse  77, temperature 97.9 F (36.6 C), temperature source Oral, resp. rate 18, SpO2 96.00%. Physical Exam  Vitals reviewed. Constitutional: She is oriented to person, place, and time. She appears well-developed and well-nourished. No distress.       Morbidly obese  HENT:  Head: Normocephalic and atraumatic.  Right Ear: External ear normal.  Left Ear: External ear normal.  Eyes: Conjunctivae are normal. No scleral icterus.  Neck: Neck supple. No tracheal deviation present.  Cardiovascular: Normal rate, regular rhythm and normal heart sounds.   Respiratory: Effort normal and breath sounds normal. No respiratory distress. She has no wheezes.  GI: Soft. Bowel sounds are normal. She exhibits no distension. There is no tenderness.       Supraumbilical ventral hernia. No skin changes  Musculoskeletal: She exhibits no edema.    Neurological: She is alert and oriented to person, place, and time.  Skin: Skin is warm and dry. She is not diaphoretic.  Psychiatric: She has a normal mood and affect. Her behavior is normal. Judgment and thought content normal.     Assessment/Plan Ventral Hernia  To or for repair. All questions asked and answered.  Kathryn Bruce. Andrey Campanile, MD, FACS General, Bariatric, & Minimally Invasive Surgery [facility], [place]   [facility] M 09/04/2011, 7:25 AM

## 2011-09-04 NOTE — Progress Notes (Signed)
Dr. Andrey Campanile made aware of patient's CBG RESULTS- 181- in PACU- to write orders later if necessary

## 2011-09-04 NOTE — Op Note (Signed)
Laparoscopic Incarcerated Ventral Hernia Repair & placement of On-Q pain catheters Procedure Note  Indications: Symptomatic incarcerated ventral hernia  Pre-operative Diagnosis: incarcerated ventral hernia x 2  Post-operative Diagnosis: same  Surgeon: Atilano Ina   Assistants: none  Anesthesia: General endotracheal anesthesia and Local anesthesia 0.25.% bupivacaine, with epinephrine  ASA Class: 2  Procedure Details  The patient was seen in the Holding Room. The risks, benefits, complications, treatment options, and expected outcomes were discussed with the patient. The possibilities of reaction to medication, pulmonary aspiration, perforation of viscus, bleeding, recurrent infection, recurrent hernia, blood clot formation, chronic pain, the need for additional procedures, failure to diagnose a condition, and creating a complication requiring transfusion or operation were discussed with the patient. The patient concurred with the proposed plan, giving informed consent.  The site of surgery properly noted/marked. The patient was taken to the operating room, identified as Kathryn Bruce and the procedure verified as laparoscopic ventral hernia repair with mesh. A Time Out was held and the above information confirmed.    The patient was placed supine.  After establishing general anesthesia, a Foley catheter was placed.  The abdomen was prepped with Chloraprep and draped in standard fashion.  A 5 mm Optiview was used the cannulate the peritoneal cavity in the left upper quadrant below the costal margin.  Pneumoperitoneum was obtained by insufflating CO2, maintaining a maximum pressure of 15 mmHg.  The 5 mm 30-degree laparoscopic was inserted.  There was omentum and a portion of the transverse colon going into the anterior abdominal wall hernia defect about 4 inches above the umbilicus. There was a second more inferior fascial defect below the incarcerated hernia (but still just above the umbilicus).  This second hernia defect measured about 1 cm x 1cm. Because some of the fascial defect was extending more toward the left, I decided to work from the patients right side. A 5mm port was placed in the right upper lateral quadrant.  An 11-mm port was placed in the right lower quadrant. Both of these were placed under direct visualization after local had been infiltrated. Using atraumatic grasper, I gently reduced the omentum and transverse colon from the larger defect. This took about several minutes to do. Once it was reduced, I inspected the bowel to make sure it had not been injured. There was no evidence of serosal tears or enterotomy.   Harmonic scalpel and gentle traction were used to dissect the omental adhesions away from the anterior abdominal wall as well as to take down the falciform ligament.  We cleared the entire abdominal wall and were able to visualize 2 fascial defects. We used a spinal needle to identify the extent of the hernia defects.  The large hernia measured 6.5 cm x 6cm. Incorporating both fascial defects, there area measured 8.5 x 6.5 cm .  We selected a 20 x 15cm piece of Parietex PCO mesh.  We placed 12 stay sutures of 0 Novofil around the edges of the mesh.  The mesh wasthen rolled up and inserted through the 11 mm port site.  The mesh was then unrolled.  The stay sutures were then pulled up through small stab incisions using the Endo-close device.  This deployed the mesh widely over the fascial defects.  The stay sutures were then tied down.  The Secure Strap device was then used to tack down the edges of the mesh at 1 cm intervals circumferentially.  We placed a few tacks inside the outer ring of tacks.  We  inspected for hemostasis.  The mesh was well deployed. There was no redundancy. There were no gaps between the edge of the mesh and the abdominal wall where a loop of bowel could become trapped. I then made 2 stab incisions in the skin in the lower abdomen and tunnelled 2 blunt tip  On-Q pain catheter sheaths along the peritoneum. There was no violation of the peritoneum.  Pneumoperitoneum was then released as we removed the remainder of the trocars.  The port sites were closed with 4-0 Monocryl.  All of the incisions and stay suture sites were then sealed with Dermabond. (2) 5 inch On-Q pain catheters were then threaded thru the sheaths and the sheaths were removed. The catheters were secured to the skin with steri-strips and a tegaderm. An abdominal binder was placed around the patient's abdomen.  The patient was extubated and brought to the recovery room in stable condition.  All sponge, instrument, and needle counts were correct prior to closure and at the conclusion of the case.   Findings: Incarcerated ventral hernia; 2 fascial defects (1 large and 1 very small); 15 cm x 20cm parietex PCO mesh  Estimated Blood Loss:  Minimal         Complications:  None; patient tolerated the procedure well.         Disposition: PACU - hemodynamically stable.         Condition: stable

## 2011-09-04 NOTE — Anesthesia Postprocedure Evaluation (Signed)
  Anesthesia Post-op Note  Patient: Kathryn Bruce  Procedure(s) Performed: Procedure(s) (LRB): LAPAROSCOPIC VENTRAL HERNIA (N/A) INSERTION OF MESH (N/A)  Patient Location: PACU  Anesthesia Type: General  Level of Consciousness: awake and alert   Airway and Oxygen Therapy: Patient Spontanous Breathing  Post-op Pain: mild  Post-op Assessment: Post-op Vital signs reviewed, Patient's Cardiovascular Status Stable, Respiratory Function Stable, Patent Airway and No signs of Nausea or vomiting  Post-op Vital Signs: stable  Complications: No apparent anesthesia complications

## 2011-09-04 NOTE — Anesthesia Preprocedure Evaluation (Addendum)
Anesthesia Evaluation  Patient identified by MRN, date of birth, ID band Patient awake    Reviewed: Allergy & Precautions, H&P , NPO status , Patient's Chart, lab work & pertinent test results  Airway Mallampati: II TM Distance: >3 FB Neck ROM: Full    Dental No notable dental hx.    Pulmonary shortness of breath, sleep apnea , Current Smoker,  breath sounds clear to auscultation  Pulmonary exam normal       Cardiovascular hypertension, Pt. on medications + Valvular Problems/Murmurs Rhythm:Regular Rate:Normal     Neuro/Psych negative neurological ROS  negative psych ROS   GI/Hepatic Neg liver ROS, GERD-  Medicated,  Endo/Other  Diabetes mellitus-Prediabetes, no meds.  Renal/GU negative Renal ROS  negative genitourinary   Musculoskeletal negative musculoskeletal ROS (+)   Abdominal (+) + obese,   Peds negative pediatric ROS (+)  Hematology negative hematology ROS (+)   Anesthesia Other Findings   Reproductive/Obstetrics negative OB ROS                         Anesthesia Physical Anesthesia Plan  ASA: II  Anesthesia Plan: General   Post-op Pain Management:    Induction: Intravenous  Airway Management Planned: Oral ETT  Additional Equipment:   Intra-op Plan:   Post-operative Plan: Extubation in OR  Informed Consent: I have reviewed the patients History and Physical, chart, labs and discussed the procedure including the risks, benefits and alternatives for the proposed anesthesia with the patient or authorized representative who has indicated his/her understanding and acceptance.   Dental advisory given  Plan Discussed with: CRNA  Anesthesia Plan Comments:         Anesthesia Quick Evaluation

## 2011-09-05 ENCOUNTER — Encounter (HOSPITAL_COMMUNITY): Payer: Self-pay | Admitting: General Surgery

## 2011-09-05 DIAGNOSIS — Z720 Tobacco use: Secondary | ICD-10-CM | POA: Diagnosis present

## 2011-09-05 DIAGNOSIS — I10 Essential (primary) hypertension: Secondary | ICD-10-CM | POA: Diagnosis present

## 2011-09-05 HISTORY — DX: Tobacco use: Z72.0

## 2011-09-05 LAB — CBC
HCT: 37.1 % (ref 36.0–46.0)
Hemoglobin: 12.3 g/dL (ref 12.0–15.0)
MCHC: 33.2 g/dL (ref 30.0–36.0)
MCV: 89.6 fL (ref 78.0–100.0)

## 2011-09-05 LAB — BASIC METABOLIC PANEL
BUN: 8 mg/dL (ref 6–23)
CO2: 25 mEq/L (ref 19–32)
Chloride: 104 mEq/L (ref 96–112)
GFR calc Af Amer: >90 mL/min (ref >90)
Potassium: 3.1 mEq/L — ABNORMAL LOW (ref 3.5–5.1)

## 2011-09-05 MED ORDER — POTASSIUM CHLORIDE CRYS ER 20 MEQ PO TBCR
60.0000 meq | EXTENDED_RELEASE_TABLET | Freq: Once | ORAL | Status: AC
  Administered 2011-09-05: 60 meq via ORAL
  Filled 2011-09-05: qty 3

## 2011-09-05 NOTE — Progress Notes (Signed)
1 Day Post-Op  Subjective: Very sore. No burping/flatus. Tolerating liquids. Pain meds help. Hasn't gotten out of bed  Objective: Vital signs in last 24 hours: Temp:  [96.9 F (36.1 C)-97.9 F (36.6 C)] 97.5 F (36.4 C) (06/28 0604) Pulse Rate:  [52-70] 52  (06/28 0604) Resp:  [10-21] 14  (06/28 0604) BP: (90-140)/(39-79) 90/60 mmHg (06/28 0604) SpO2:  [91 %-98 %] 93 % (06/28 0604) Weight:  [220 lb 7.4 oz (100 kg)] 220 lb 7.4 oz (100 kg) (06/28 0015) Last BM Date: 09/04/11  Intake/Output from previous day: 06/27 0701 - 06/28 0700 In: 4831.7 [P.O.:840; I.V.:3681.7; IV Piggyback:310] Out: 1725 [Urine:1725] Intake/Output this shift:    Alert, nad cta Reg Obese, soft, expected TTP. Incisions c/d/i. +On-Q pain pump intact; no bruising No edema, +scds  Lab Results:   Basename 09/05/11 0420  WBC 12.2*  HGB 12.3  HCT 37.1  PLT 258   BMET  Basename 09/05/11 0420  NA 136  K 3.1*  CL 104  CO2 25  GLUCOSE 127*  BUN 8  CREATININE 0.51  CALCIUM 8.0*   PT/INR No results found for this basename: LABPROT:2,INR:2 in the last 72 hours ABG No results found for this basename: PHART:2,PCO2:2,PO2:2,HCO3:2 in the last 72 hours  Studies/Results: No results found.  Anti-infectives: Anti-infectives     Start     Dose/Rate Route Frequency Ordered Stop   09/04/11 0541   ceFAZolin (ANCEF) IVPB 2 g/50 mL premix        2 g 100 mL/hr over 30 Minutes Intravenous 60 min pre-op 09/04/11 0541 09/04/11 0744          Assessment/Plan: s/p Procedure(s) (LRB): LAPAROSCOPIC VENTRAL HERNIA (N/A) INSERTION OF MESH (N/A)  HTN - cont home meds, BP stable Hypokalemia - replace Potassium GI - adv diet as tolerated Pulm - IS, wean O2, flutter valve, OOB, ambulate Renal - good uop, D/C foley  Pt needs additional time in hospital for pain control, return of bowel function.  [name]. Andrey Campanile, MD, FACS General, Bariatric, & Minimally Invasive Surgery [facility], [place]   LOS:  1 day    Atilano Ina 09/05/2011

## 2011-09-05 NOTE — Discharge Instructions (Signed)
[facility], [place]   HERNIA REPAIR: POST OP INSTRUCTIONS  Always review your discharge instruction sheet given to you by the facility where your surgery was performed. IF YOU HAVE DISABILITY OR FAMILY LEAVE FORMS, YOU MUST BRING THEM TO THE OFFICE FOR PROCESSING.   DO NOT GIVE THEM TO YOUR DOCTOR.  1. A  prescription for pain medication may be given to you upon discharge.  Take your pain medication as prescribed, if needed.  If narcotic pain medicine is not needed, then you may take acetaminophen (Tylenol) or ibuprofen (Advil) as needed. 2. Take your usually prescribed medications unless otherwise directed. 3. If you need a refill on your pain medication, please contact your pharmacy.  They will contact our office to request authorization. Prescriptions will not be filled after 5 pm or on week-ends. 4. You should follow a light diet the first 24 hours after arrival home, such as soup and crackers, etc.  Be sure to include lots of fluids daily.  Resume your normal diet the day after surgery. 5. Most patients will experience some swelling and bruising around the umbilicus or in the abdomen.  Ice packs will help.  Swelling and bruising can take several days to resolve.  6. It is common to experience some constipation if taking pain medication after surgery.  Increasing fluid intake and taking a stool softener (such as Colace) will usually help or prevent this problem from occurring.  A mild laxative (Milk of Magnesia or Miralax) should be taken according to package directions if there are no bowel movements after 48 hours. 7. Unless discharge instructions indicate otherwise, you may remove your bandages 48 hours after surgery, and you may shower at that time & after the pain pump has been removed.  The glue will flake off over the next 2-3 weeks.  Any sutures or staples will be removed at the office during your follow-up visit. 8. ACTIVITIES:  You may resume regular (light) daily activities  beginning the next day--such as daily self-care, walking, climbing stairs--gradually increasing activities as tolerated.  You may have sexual intercourse when it is comfortable.  Refrain from any heavy lifting or straining until approved by your doctor. a. You may drive when you are no longer taking prescription pain medication, you can comfortably wear a seatbelt, and you can safely maneuver your car and apply brakes. 9. You should see your doctor in the office for a follow-up appointment approximately 2-3 weeks after your surgery.  Make sure that you call for this appointment within a day or two after you arrive home to insure a convenient appointment time. 10. OTHER INSTRUCTIONS: DO NOT LIFT, PUSH, OR PULL ANYTHING GREATER THAN 15 POUNDS FOR 6 WEEKS 11. On-Q Pain catheter: If you are discharged to home with the pain pump still in place - On Sunday, removed the clear adhesive bandage on your lower abdomen and remove the white strips over the catheter tubing, once this is done, simply pull the 2 catheters out and discard.    WHEN TO CALL YOUR DOCTOR: 1. Fever over 101.0 2. Inability to urinate 3. Nausea and/or vomiting 4. Extreme swelling or bruising 5. Continued bleeding from incision. 6. Increased pain, redness, or drainage from the incision  The clinic staff is available to answer your questions during regular business hours.  Please don't hesitate to call and ask to speak to one of the nurses for clinical concerns.  If you have a medical emergency, go to the nearest emergency room or  call 911.  A surgeon from [facility] is always on call at the hospital   [address], [place], [place]  [address] ?  [address] [address], [place], [place]   [address] [phone] ? [phone] ? FAX [phone] Web site: [email or web address]

## 2011-09-06 LAB — GLUCOSE, CAPILLARY: Glucose-Capillary: 111 mg/dL — ABNORMAL HIGH (ref 70–99)

## 2011-09-06 NOTE — Progress Notes (Signed)
2 Days Post-Op  Subjective: Still has a lot of soreness on left side. Moving gingerly  Objective: Vital signs in last 24 hours: Temp:  [98.2 F (36.8 C)-98.8 F (37.1 C)] 98.8 F (37.1 C) (06/29 0659) Pulse Rate:  [65-70] 66  (06/29 0659) Resp:  [16-18] 18  (06/29 0659) BP: (91-127)/(52-76) 95/67 mmHg (06/29 0659) SpO2:  [92 %-93 %] 93 % (06/29 0659) Last BM Date: 09/04/11  Intake/Output from previous day: 06/28 0701 - 06/29 0700 In: 1200.4 [P.O.:240; I.V.:960.4] Out: 1500 [Urine:1500] Intake/Output this shift: Total I/O In: -  Out: 250 [Urine:250]  GI: soft, appropriately tender  Lab Results:   Basename 09/05/11 0420  WBC 12.2*  HGB 12.3  HCT 37.1  PLT 258   BMET  Basename 09/05/11 0420  NA 136  K 3.1*  CL 104  CO2 25  GLUCOSE 127*  BUN 8  CREATININE 0.51  CALCIUM 8.0*   PT/INR No results found for this basename: LABPROT:2,INR:2 in the last 72 hours ABG No results found for this basename: PHART:2,PCO2:2,PO2:2,HCO3:2 in the last 72 hours  Studies/Results: No results found.  Anti-infectives: Anti-infectives     Start     Dose/Rate Route Frequency Ordered Stop   09/04/11 0541   ceFAZolin (ANCEF) IVPB 2 g/50 mL premix        2 g 100 mL/hr over 30 Minutes Intravenous 60 min pre-op 09/04/11 0541 09/04/11 0744          Assessment/Plan: s/p Procedure(s) (LRB): LAPAROSCOPIC VENTRAL HERNIA (N/A) INSERTION OF MESH (N/A) try to mobilize today She probably needs one more day here  LOS: 2 days    [name],Chandy Tarman S 09/06/2011

## 2011-09-07 MED ORDER — NICOTINE 14 MG/24HR TD PT24: 1.0000 | MEDICATED_PATCH | Freq: Every day | TRANSDERMAL | Status: AC

## 2011-09-07 MED ORDER — OXYCODONE-ACETAMINOPHEN 7.5-325 MG PO TABS: 1.0000 | ORAL_TABLET | ORAL | Status: AC | PRN

## 2011-09-07 NOTE — Progress Notes (Signed)
Pt stable, scripts, d/c instructions, and equipment given...no questions/concerns voiced by patient.  Transported to private vehicle via wheelchair with NT and son.

## 2011-09-07 NOTE — Progress Notes (Signed)
3 Days Post-Op  Subjective: Feeling better. Complains of mild soreness  Objective: Vital signs in last 24 hours: Temp:  [98 F (36.7 C)-98.4 F (36.9 C)] 98.1 F (36.7 C) (06/30 0432) Pulse Rate:  [77-88] 88  (06/30 0432) Resp:  [16] 16  (06/30 0432) BP: (99-143)/(64-82) 143/82 mmHg (06/30 0432) SpO2:  [91 %-94 %] 91 % (06/30 0432) Last BM Date: 09/04/11  Intake/Output from previous day: 06/29 0701 - 06/30 0700 In: 1080 [P.O.:1080] Out: 1300 [Urine:1300] Intake/Output this shift:    GI: soft, mild tenderness. good flatus  Lab Results:   Basename 09/05/11 0420  WBC 12.2*  HGB 12.3  HCT 37.1  PLT 258   BMET  Basename 09/05/11 0420  NA 136  K 3.1*  CL 104  CO2 25  GLUCOSE 127*  BUN 8  CREATININE 0.51  CALCIUM 8.0*   PT/INR No results found for this basename: LABPROT:2,INR:2 in the last 72 hours ABG No results found for this basename: PHART:2,PCO2:2,PO2:2,HCO3:2 in the last 72 hours  Studies/Results: No results found.  Anti-infectives: Anti-infectives     Start     Dose/Rate Route Frequency Ordered Stop   09/04/11 0541   ceFAZolin (ANCEF) IVPB 2 g/50 mL premix        2 g 100 mL/hr over 30 Minutes Intravenous 60 min pre-op 09/04/11 0541 09/04/11 0744          Assessment/Plan: Bruce/p Procedure(Bruce) (LRB): LAPAROSCOPIC VENTRAL HERNIA (N/A) INSERTION OF MESH (N/A) Discharge  LOS: 3 days    Kathryn Bruce 09/07/2011

## 2011-09-07 NOTE — Progress Notes (Signed)
09/07/11 1330 Arcenio Mullaly,RN,BSN [phone] Pt recieved rw prior to discharge delivered by [facility]. No other HH services or DME requested. Patient discharged home with spouse whom will assisy in home care.

## 2011-09-19 ENCOUNTER — Encounter (INDEPENDENT_AMBULATORY_CARE_PROVIDER_SITE_OTHER): Payer: Self-pay | Admitting: General Surgery

## 2011-09-19 ENCOUNTER — Ambulatory Visit (INDEPENDENT_AMBULATORY_CARE_PROVIDER_SITE_OTHER): Payer: Medicare Other | Admitting: General Surgery

## 2011-09-19 VITALS — BP 128/72 | HR 80 | Temp 98.8°F | Resp 18 | Ht 65.0 in | Wt 219.6 lb

## 2011-09-19 DIAGNOSIS — Z09 Encounter for follow-up examination after completed treatment for conditions other than malignant neoplasm: Secondary | ICD-10-CM

## 2011-09-19 NOTE — Progress Notes (Signed)
Subjective:     Patient ID: Kathryn Bruce, female   DOB: 1945/08/06, 66 y.o.   MRN: [id number]  HPI 66 year old Caucasian female comes in today for her first postoperative appointment after undergoing a laparoscopic repair of incarcerated ventral hernia with mesh on June 27. She was discharged from the hospital on June 30. She states that she feels better. She reports having a large amount of pain and discomfort the first several weeks after surgery. She states that it has significantly improved. She is now generally taking a pain pill twice a day. She denies any fever, chills, nausea, or vomiting. She is having regular bowel movements. She has not started smoking since surgery. She has some soreness on the left and right  lower abdominal wall.  Review of Systems     Objective:   Physical Exam BP 128/72  Pulse 80  Temp 98.8 F (37.1 C) (Temporal)  Resp 18  Ht 5\' 5"  (1.651 m)  Wt 219 lb 9.6 oz (99.61 kg)  BMI 36.54 kg/m2  Gen: alert, NAD, non-toxic appearing Pupils: equal, no scleral icterus Pulm: Lungs clear to auscultation, symmetric chest rise CV: regular rate and rhythm Abd: soft, nontender, nondistended. Well-healed trocar sites. No cellulitis. Small seroma/hematoma at former ventral hernia site.  Ext: no edema, no calf tenderness Skin: no rash, no jaundice     Assessment:     Status post laparoscopic repair of incarcerated ventral hernia with mesh    Plan:     Overall she is doing fairly well after surgery. I reminded her she should not lift, push, or pulling anything greater than 15 pounds for another 3 weeks. I encouraged her to continue to refrain from smoking. I encouraged her to walk on a daily basis. Followup 6 weeks  [name]. Andrey Campanile, MD, FACS General, Bariatric, & Minimally Invasive Surgery [facility], [place]

## 2011-09-19 NOTE — Patient Instructions (Signed)
Continue to avoid heavy lifting/strenuous activities for another 4 weeks

## 2011-10-30 ENCOUNTER — Other Ambulatory Visit: Payer: Self-pay | Admitting: Family Medicine

## 2011-10-30 DIAGNOSIS — IMO0002 Reserved for concepts with insufficient information to code with codable children: Secondary | ICD-10-CM

## 2011-11-06 ENCOUNTER — Ambulatory Visit
Admission: RE | Admit: 2011-11-06 | Discharge: 2011-11-06 | Disposition: A | Discharge status: Home / Self Care | Payer: Medicare Other | Source: Ambulatory Visit | Attending: Family Medicine | Admitting: Family Medicine

## 2011-11-06 ENCOUNTER — Encounter (INDEPENDENT_AMBULATORY_CARE_PROVIDER_SITE_OTHER): Payer: Self-pay | Admitting: General Surgery

## 2011-11-06 ENCOUNTER — Ambulatory Visit (INDEPENDENT_AMBULATORY_CARE_PROVIDER_SITE_OTHER): Payer: Medicare Other | Admitting: General Surgery

## 2011-11-06 VITALS — BP 148/78 | HR 64 | Temp 97.9°F | Resp 16 | Ht 65.5 in | Wt 218.4 lb

## 2011-11-06 DIAGNOSIS — IMO0002 Reserved for concepts with insufficient information to code with codable children: Secondary | ICD-10-CM

## 2011-11-06 DIAGNOSIS — Z09 Encounter for follow-up examination after completed treatment for conditions other than malignant neoplasm: Secondary | ICD-10-CM

## 2011-11-06 NOTE — Patient Instructions (Signed)
Can resume full activities. Try to stop smoking. Discuss with your primary care doctor about the colonoscopy

## 2011-11-06 NOTE — Progress Notes (Signed)
Subjective:     Patient ID: Kathryn Bruce, female   DOB: May 22, 1945, 66 y.o.   MRN: [id number]  HPI 66 year old Caucasian female comes in today for her second postoperative appointment after undergoing a laparoscopic repair of incarcerated ventral hernia with mesh on June 27. She was discharged from the hospital on June 30. She states that she feels better. She reports her pain has some resolved.   She denies any fever, chills, nausea, or vomiting. She is having regular bowel movements. She has re-started smoking since surgery. She has some very mild soreness on the left and right  lower abdominal wall.  Review of Systems     Objective:   Physical Exam BP 148/78  Pulse 64  Temp 97.9 F (36.6 C) (Temporal)  Resp 16  Ht 5' 5.5" (1.664 m)  Wt 218 lb 6.4 oz (99.066 kg)  BMI 35.79 kg/m2  Gen: alert, NAD, non-toxic appearing Pupils: equal, no scleral icterus Pulm: Lungs clear to auscultation, symmetric chest rise CV: regular rate and rhythm Abd: soft, nontender, nondistended. Well-healed trocar sites. No cellulitis. Smaller seroma/hematoma at former ventral hernia site.  Ext: no edema, no calf tenderness Skin: no rash, no jaundice     Assessment:     Status post laparoscopic repair of incarcerated ventral hernia with mesh    Plan:     Overall she is doing fairly well after surgery. Cleared for full activities. I encouraged her to refrain from smoking. I encouraged her to walk on a daily basis. She was advised to ask her PCP regarding her colonoscopy. She states it was incomplete when it was performed 2 years ago b/c a portion of her colon was trapped in the hernia. Followup 6 months.  [name]. Andrey Campanile, MD, FACS General, Bariatric, & Minimally Invasive Surgery [facility], [place]

## 2011-11-14 ENCOUNTER — Ambulatory Visit
Admission: RE | Admit: 2011-11-14 | Discharge: 2011-11-14 | Disposition: A | Discharge status: Home / Self Care | Payer: Medicare Other | Source: Ambulatory Visit | Attending: Family Medicine | Admitting: Family Medicine

## 2011-11-14 DIAGNOSIS — Z1231 Encounter for screening mammogram for malignant neoplasm of breast: Secondary | ICD-10-CM

## 2012-05-05 ENCOUNTER — Ambulatory Visit (INDEPENDENT_AMBULATORY_CARE_PROVIDER_SITE_OTHER): Payer: Medicare Other | Admitting: General Surgery

## 2012-05-05 ENCOUNTER — Telehealth (INDEPENDENT_AMBULATORY_CARE_PROVIDER_SITE_OTHER): Payer: Self-pay | Admitting: General Surgery

## 2012-05-05 NOTE — Telephone Encounter (Signed)
Per Dr. Andrey Campanile, if pt does not feel the need to be seen in 6 month follow-up and is doing well, OK to cancel appt.  She understands and will call us for new appt prn.

## 2013-06-13 ENCOUNTER — Other Ambulatory Visit: Payer: Self-pay | Admitting: Family Medicine

## 2013-06-13 DIAGNOSIS — R0602 Shortness of breath: Secondary | ICD-10-CM

## 2013-06-13 DIAGNOSIS — Z720 Tobacco use: Secondary | ICD-10-CM

## 2013-10-06 ENCOUNTER — Other Ambulatory Visit: Payer: Self-pay | Admitting: Gastroenterology

## 2015-03-11 HISTORY — PX: MULTIPLE TOOTH EXTRACTIONS: SHX2053

## 2015-03-26 ENCOUNTER — Other Ambulatory Visit: Payer: Self-pay | Admitting: Family Medicine

## 2015-03-26 DIAGNOSIS — Z1231 Encounter for screening mammogram for malignant neoplasm of breast: Secondary | ICD-10-CM

## 2015-03-27 ENCOUNTER — Other Ambulatory Visit: Payer: Self-pay | Admitting: Family Medicine

## 2015-03-27 DIAGNOSIS — E1165 Type 2 diabetes mellitus with hyperglycemia: Secondary | ICD-10-CM

## 2015-04-03 ENCOUNTER — Ambulatory Visit
Admission: RE | Admit: 2015-04-03 | Discharge: 2015-04-03 | Disposition: A | Discharge status: Home / Self Care | Payer: Medicare Other | Source: Ambulatory Visit | Attending: Family Medicine | Admitting: Family Medicine

## 2015-04-03 DIAGNOSIS — E1165 Type 2 diabetes mellitus with hyperglycemia: Secondary | ICD-10-CM

## 2015-04-10 ENCOUNTER — Ambulatory Visit
Admission: RE | Admit: 2015-04-10 | Discharge: 2015-04-10 | Disposition: A | Discharge status: Home / Self Care | Payer: Medicare Other | Source: Ambulatory Visit | Attending: Family Medicine | Admitting: Family Medicine

## 2015-04-10 DIAGNOSIS — Z1231 Encounter for screening mammogram for malignant neoplasm of breast: Secondary | ICD-10-CM

## 2015-04-12 ENCOUNTER — Ambulatory Visit: Payer: Self-pay

## 2015-06-29 DIAGNOSIS — E119 Type 2 diabetes mellitus without complications: Secondary | ICD-10-CM | POA: Diagnosis not present

## 2015-07-19 DIAGNOSIS — H00023 Hordeolum internum right eye, unspecified eyelid: Secondary | ICD-10-CM | POA: Diagnosis not present

## 2015-10-08 DIAGNOSIS — E119 Type 2 diabetes mellitus without complications: Secondary | ICD-10-CM | POA: Diagnosis not present

## 2015-10-11 DIAGNOSIS — Z720 Tobacco use: Secondary | ICD-10-CM | POA: Diagnosis not present

## 2015-10-11 DIAGNOSIS — E785 Hyperlipidemia, unspecified: Secondary | ICD-10-CM | POA: Diagnosis not present

## 2015-10-11 DIAGNOSIS — M1990 Unspecified osteoarthritis, unspecified site: Secondary | ICD-10-CM | POA: Diagnosis not present

## 2015-10-11 DIAGNOSIS — I10 Essential (primary) hypertension: Secondary | ICD-10-CM | POA: Diagnosis not present

## 2015-10-11 DIAGNOSIS — Z7984 Long term (current) use of oral hypoglycemic drugs: Secondary | ICD-10-CM | POA: Diagnosis not present

## 2015-10-11 DIAGNOSIS — E1165 Type 2 diabetes mellitus with hyperglycemia: Secondary | ICD-10-CM | POA: Diagnosis not present

## 2015-10-19 ENCOUNTER — Telehealth: Payer: Self-pay | Admitting: Acute Care

## 2015-10-24 ENCOUNTER — Other Ambulatory Visit: Payer: Self-pay | Admitting: Acute Care

## 2015-10-24 DIAGNOSIS — F17210 Nicotine dependence, cigarettes, uncomplicated: Secondary | ICD-10-CM

## 2015-10-24 NOTE — Telephone Encounter (Signed)
Pt returned call [facility] scheduled on 12/06/15 at 10am CT order placed Pt voiced understanding and had no further questions Nothing further needed.

## 2015-10-24 NOTE — Telephone Encounter (Signed)
LVM for pt to return call

## 2015-12-06 ENCOUNTER — Encounter: Payer: Medicare Other | Admitting: Acute Care

## 2015-12-06 ENCOUNTER — Inpatient Hospital Stay: Admission: RE | Admit: 2015-12-06 | Payer: Medicare Other | Source: Ambulatory Visit

## 2015-12-06 DIAGNOSIS — Z23 Encounter for immunization: Secondary | ICD-10-CM | POA: Diagnosis not present

## 2015-12-06 DIAGNOSIS — E119 Type 2 diabetes mellitus without complications: Secondary | ICD-10-CM | POA: Diagnosis not present

## 2015-12-06 DIAGNOSIS — Z720 Tobacco use: Secondary | ICD-10-CM | POA: Diagnosis not present

## 2015-12-06 DIAGNOSIS — E785 Hyperlipidemia, unspecified: Secondary | ICD-10-CM | POA: Diagnosis not present

## 2015-12-06 DIAGNOSIS — I10 Essential (primary) hypertension: Secondary | ICD-10-CM | POA: Diagnosis not present

## 2015-12-06 DIAGNOSIS — Z0000 Encounter for general adult medical examination without abnormal findings: Secondary | ICD-10-CM | POA: Diagnosis not present

## 2016-01-03 ENCOUNTER — Encounter: Payer: Self-pay | Admitting: Acute Care

## 2016-01-03 ENCOUNTER — Ambulatory Visit (INDEPENDENT_AMBULATORY_CARE_PROVIDER_SITE_OTHER): Payer: Medicare Other | Admitting: Acute Care

## 2016-01-03 ENCOUNTER — Ambulatory Visit (INDEPENDENT_AMBULATORY_CARE_PROVIDER_SITE_OTHER)
Admission: RE | Admit: 2016-01-03 | Discharge: 2016-01-03 | Disposition: A | Discharge status: Home / Self Care | Payer: Medicare Other | Source: Ambulatory Visit | Attending: Acute Care | Admitting: Acute Care

## 2016-01-03 DIAGNOSIS — Z87891 Personal history of nicotine dependence: Secondary | ICD-10-CM | POA: Diagnosis not present

## 2016-01-03 DIAGNOSIS — F17210 Nicotine dependence, cigarettes, uncomplicated: Secondary | ICD-10-CM

## 2016-01-03 NOTE — Progress Notes (Signed)
Shared Decision Making Visit Lung Cancer Screening Program [phone])   Eligibility:  Age 70 y.o.  Pack Years Smoking History Calculation 40 pack years (# packs/per year x # years smoked)  Recent History of coughing up blood  yes  Unexplained weight loss? yes ( >Than 15 pounds within the last 6 months )  Prior History Lung / other cancer yes (Diagnosis within the last 5 years already requiring surveillance chest CT Scans).  Smoking Status Current Smoker  Former Smokers: Years since quit: NA  Quit Date: NA  Visit Components:  Discussion included one or more decision making aids. yes  Discussion included risk/benefits of screening. yes  Discussion included potential follow up diagnostic testing for abnormal scans. yes  Discussion included meaning and risk of over diagnosis. yes  Discussion included meaning and risk of False Positives. yes  Discussion included meaning of total radiation exposure. yes  Counseling Included:  Importance of adherence to annual lung cancer LDCT screening. yes  Impact of comorbidities on ability to participate in the program. yes  Ability and willingness to under diagnostic treatment. yes  Smoking Cessation Counseling:  Current Smokers:   Discussed importance of smoking cessation. yes  Information about tobacco cessation classes and interventions provided to patient. yes  Patient provided with "ticket" for LDCT Scan. yes  Symptomatic Patient. no  Counseling NA  Diagnosis Code: Tobacco Use Z72.0  Asymptomatic Patient yes  Counseling (Intermediate counseling: > three minutes counseling) [id number]  Former Smokers:   Discussed the importance of maintaining cigarette abstinence. yes  Diagnosis Code: Personal History of Nicotine Dependence. [id number]  Information about tobacco cessation classes and interventions provided to patient. Yes  Patient provided with "ticket" for LDCT Scan. yes  Written Order for Lung Cancer Screening with  LDCT placed in Epic. Yes (CT Chest Lung Cancer Screening Low Dose W/O CM) [id number] Z12.2-Screening of respiratory organs Z87.891-Personal history of nicotine dependence  I have spent 20 minutes of face to face time with Kathryn Bruce discussing the risks and benefits of lung cancer screening. We viewed a power point together that explained in detail the above noted topics. We paused at intervals to allow for questions to be asked and answered to ensure understanding.We discussed that the single most powerful action that she can take to decrease her risk of developing lung cancer is to quit smoking. We discussed whether or not she is ready to commit to setting a quit date. We discussed options for tools to aid in quitting smoking including nicotine replacement therapy, non-nicotine medications, support groups, Quit Smart classes, and behavior modification. We discussed that often times setting smaller, more achievable goals, such as eliminating 1 cigarette a day for a week and then 2 cigarettes a day for a week can be helpful in slowly decreasing the number of cigarettes smoked. This allows for a sense of accomplishment as well as providing a clinical benefit. I gave her the " Be Stronger Than Your Excuses" card with contact information for community resources, classes, free nicotine replacement therapy, and access to mobile apps, text messaging, and on-line smoking cessation help. I have also given her my card and contact information in the event she needs to contact me. We discussed the time and location of the scan, and that either June Leap, CMA, or I will call with the results within 24-48 hours of receiving them. I have provided her with a copy of the power point we viewed  as a resource in the event they need reinforcement of the  concepts we discussed today in the office. The patient verbalized understanding of all of  the above and had no further questions upon leaving the office. They have my contact  information in the event they have any further questions.   Kathryn Spatz, NP 01/03/2016

## 2016-01-04 ENCOUNTER — Telehealth: Payer: Self-pay | Admitting: Acute Care

## 2016-01-04 DIAGNOSIS — F17210 Nicotine dependence, cigarettes, uncomplicated: Secondary | ICD-10-CM

## 2016-01-04 NOTE — Telephone Encounter (Addendum)
I have called [name] with the results of her low-dose screening CT. I explained to her that her scan was read as a lung rads 3, indicating most likely benign findings. Recommendation per radiology is for short-term follow-up with a repeat scan in 6 months. [name] verbalized understanding of the above. Additionally I explained to her that there was a finding of mild emphysema, and aortic atherosclerosis and coronary artery disease. The patient is currently taking Lipitor per her primary care provider, and she has triglycerides and cholesterol checked on an  annual basis. I will fax a copy of the results of this scan to Dr. Darcus Austin for completeness of the patient's medical record. The patient is seeing Dr. Inda Merlin 01/15/2016, and she will  discuss the results of the scan with her at that time.

## 2016-01-04 NOTE — Telephone Encounter (Signed)
I have attempted to call the results of Kathryn Bruce's low-dose CT to Dr. Darcus Austin office. There is no answer on the physician line. I will call her office on Monday and update her on Kathryn Bruce's scan results. Fax report has already been sent.

## 2016-01-10 DIAGNOSIS — E119 Type 2 diabetes mellitus without complications: Secondary | ICD-10-CM | POA: Diagnosis not present

## 2016-01-14 DIAGNOSIS — E876 Hypokalemia: Secondary | ICD-10-CM | POA: Diagnosis not present

## 2016-04-10 DIAGNOSIS — E119 Type 2 diabetes mellitus without complications: Secondary | ICD-10-CM | POA: Diagnosis not present

## 2016-06-02 DIAGNOSIS — I10 Essential (primary) hypertension: Secondary | ICD-10-CM | POA: Diagnosis not present

## 2016-06-02 DIAGNOSIS — E119 Type 2 diabetes mellitus without complications: Secondary | ICD-10-CM | POA: Diagnosis not present

## 2016-06-02 DIAGNOSIS — R32 Unspecified urinary incontinence: Secondary | ICD-10-CM | POA: Diagnosis not present

## 2016-06-02 DIAGNOSIS — E785 Hyperlipidemia, unspecified: Secondary | ICD-10-CM | POA: Diagnosis not present

## 2016-06-17 DIAGNOSIS — E785 Hyperlipidemia, unspecified: Secondary | ICD-10-CM | POA: Diagnosis not present

## 2016-06-17 DIAGNOSIS — R079 Chest pain, unspecified: Secondary | ICD-10-CM | POA: Diagnosis not present

## 2016-06-17 DIAGNOSIS — I10 Essential (primary) hypertension: Secondary | ICD-10-CM | POA: Diagnosis not present

## 2016-06-17 DIAGNOSIS — R0789 Other chest pain: Secondary | ICD-10-CM | POA: Diagnosis not present

## 2016-06-17 DIAGNOSIS — I700 Atherosclerosis of aorta: Secondary | ICD-10-CM | POA: Diagnosis not present

## 2016-06-20 ENCOUNTER — Telehealth: Payer: Self-pay | Admitting: Acute Care

## 2016-06-23 NOTE — Telephone Encounter (Signed)
Will forward to the lung nodule pool 

## 2016-06-23 NOTE — Telephone Encounter (Signed)
Spoke with Rodena Piety, [facility] and she will call pt to schedule CT.

## 2016-07-03 DIAGNOSIS — I700 Atherosclerosis of aorta: Secondary | ICD-10-CM | POA: Diagnosis not present

## 2016-07-03 DIAGNOSIS — R0789 Other chest pain: Secondary | ICD-10-CM | POA: Diagnosis not present

## 2016-07-03 DIAGNOSIS — I10 Essential (primary) hypertension: Secondary | ICD-10-CM | POA: Diagnosis not present

## 2016-07-03 DIAGNOSIS — E785 Hyperlipidemia, unspecified: Secondary | ICD-10-CM | POA: Diagnosis not present

## 2016-07-04 ENCOUNTER — Ambulatory Visit (INDEPENDENT_AMBULATORY_CARE_PROVIDER_SITE_OTHER)
Admission: RE | Admit: 2016-07-04 | Discharge: 2016-07-04 | Disposition: A | Discharge status: Home / Self Care | Payer: Medicare Other | Source: Ambulatory Visit | Attending: Acute Care | Admitting: Acute Care

## 2016-07-04 DIAGNOSIS — R918 Other nonspecific abnormal finding of lung field: Secondary | ICD-10-CM

## 2016-07-04 DIAGNOSIS — F17210 Nicotine dependence, cigarettes, uncomplicated: Secondary | ICD-10-CM

## 2016-07-07 ENCOUNTER — Other Ambulatory Visit: Payer: Self-pay | Admitting: Acute Care

## 2016-07-07 DIAGNOSIS — F17210 Nicotine dependence, cigarettes, uncomplicated: Secondary | ICD-10-CM

## 2016-07-10 DIAGNOSIS — E119 Type 2 diabetes mellitus without complications: Secondary | ICD-10-CM | POA: Diagnosis not present

## 2016-10-08 DIAGNOSIS — E119 Type 2 diabetes mellitus without complications: Secondary | ICD-10-CM | POA: Diagnosis not present

## 2016-11-11 DIAGNOSIS — J029 Acute pharyngitis, unspecified: Secondary | ICD-10-CM | POA: Diagnosis not present

## 2016-11-11 DIAGNOSIS — R109 Unspecified abdominal pain: Secondary | ICD-10-CM | POA: Diagnosis not present

## 2016-12-29 DIAGNOSIS — Z0000 Encounter for general adult medical examination without abnormal findings: Secondary | ICD-10-CM | POA: Diagnosis not present

## 2016-12-29 DIAGNOSIS — Z1159 Encounter for screening for other viral diseases: Secondary | ICD-10-CM | POA: Diagnosis not present

## 2016-12-29 DIAGNOSIS — E119 Type 2 diabetes mellitus without complications: Secondary | ICD-10-CM | POA: Diagnosis not present

## 2016-12-29 DIAGNOSIS — I10 Essential (primary) hypertension: Secondary | ICD-10-CM | POA: Diagnosis not present

## 2016-12-29 DIAGNOSIS — E785 Hyperlipidemia, unspecified: Secondary | ICD-10-CM | POA: Diagnosis not present

## 2017-01-05 DIAGNOSIS — I700 Atherosclerosis of aorta: Secondary | ICD-10-CM | POA: Diagnosis not present

## 2017-01-05 DIAGNOSIS — I10 Essential (primary) hypertension: Secondary | ICD-10-CM | POA: Diagnosis not present

## 2017-01-05 DIAGNOSIS — R0789 Other chest pain: Secondary | ICD-10-CM | POA: Diagnosis not present

## 2017-01-05 DIAGNOSIS — E785 Hyperlipidemia, unspecified: Secondary | ICD-10-CM | POA: Diagnosis not present

## 2017-02-24 DIAGNOSIS — E2839 Other primary ovarian failure: Secondary | ICD-10-CM | POA: Diagnosis not present

## 2017-02-24 DIAGNOSIS — M810 Age-related osteoporosis without current pathological fracture: Secondary | ICD-10-CM | POA: Diagnosis not present

## 2017-04-13 DIAGNOSIS — E119 Type 2 diabetes mellitus without complications: Secondary | ICD-10-CM | POA: Diagnosis not present

## 2017-05-14 DIAGNOSIS — Z1231 Encounter for screening mammogram for malignant neoplasm of breast: Secondary | ICD-10-CM | POA: Diagnosis not present

## 2017-07-01 DIAGNOSIS — I10 Essential (primary) hypertension: Secondary | ICD-10-CM | POA: Diagnosis not present

## 2017-07-01 DIAGNOSIS — E785 Hyperlipidemia, unspecified: Secondary | ICD-10-CM | POA: Diagnosis not present

## 2017-07-01 DIAGNOSIS — E1169 Type 2 diabetes mellitus with other specified complication: Secondary | ICD-10-CM | POA: Diagnosis not present

## 2017-07-06 ENCOUNTER — Ambulatory Visit (INDEPENDENT_AMBULATORY_CARE_PROVIDER_SITE_OTHER)
Admission: RE | Admit: 2017-07-06 | Discharge: 2017-07-06 | Disposition: A | Discharge status: Home / Self Care | Payer: Medicare Other | Source: Ambulatory Visit | Attending: Acute Care | Admitting: Acute Care

## 2017-07-06 DIAGNOSIS — F17210 Nicotine dependence, cigarettes, uncomplicated: Secondary | ICD-10-CM | POA: Diagnosis not present

## 2017-07-17 ENCOUNTER — Other Ambulatory Visit: Payer: Self-pay | Admitting: Acute Care

## 2017-07-17 DIAGNOSIS — Z122 Encounter for screening for malignant neoplasm of respiratory organs: Secondary | ICD-10-CM

## 2017-07-17 DIAGNOSIS — E119 Type 2 diabetes mellitus without complications: Secondary | ICD-10-CM | POA: Diagnosis not present

## 2017-07-17 DIAGNOSIS — F17210 Nicotine dependence, cigarettes, uncomplicated: Secondary | ICD-10-CM

## 2017-09-21 ENCOUNTER — Encounter (HOSPITAL_COMMUNITY): Payer: Self-pay | Admitting: Emergency Medicine

## 2017-09-21 ENCOUNTER — Emergency Department (HOSPITAL_COMMUNITY): Payer: Medicare Other

## 2017-09-21 DIAGNOSIS — R079 Chest pain, unspecified: Secondary | ICD-10-CM | POA: Diagnosis not present

## 2017-09-21 DIAGNOSIS — Z5321 Procedure and treatment not carried out due to patient leaving prior to being seen by health care provider: Secondary | ICD-10-CM | POA: Insufficient documentation

## 2017-09-21 DIAGNOSIS — R0989 Other specified symptoms and signs involving the circulatory and respiratory systems: Secondary | ICD-10-CM | POA: Diagnosis not present

## 2017-09-21 DIAGNOSIS — M79601 Pain in right arm: Secondary | ICD-10-CM | POA: Insufficient documentation

## 2017-09-21 LAB — CBC
HCT: 42.6 % (ref 36.0–46.0)
Hemoglobin: 13.8 g/dL (ref 12.0–15.0)
MCH: 27.8 pg (ref 26.0–34.0)
MCHC: 32.4 g/dL (ref 30.0–36.0)
MCV: 85.7 fL (ref 78.0–100.0)
PLATELETS: 410 10*3/uL — AB (ref 150–400)
RBC: 4.97 MIL/uL (ref 3.87–5.11)
RDW: 15 % (ref 11.5–15.5)
WBC: 13.6 10*3/uL — ABNORMAL HIGH (ref 4.0–10.5)

## 2017-09-21 LAB — BASIC METABOLIC PANEL
Anion gap: 10 (ref 5–15)
BUN: 13 mg/dL (ref 8–23)
CALCIUM: 8.9 mg/dL (ref 8.9–10.3)
CO2: 28 mmol/L (ref 22–32)
CREATININE: 0.69 mg/dL (ref 0.44–1.00)
Chloride: 103 mmol/L (ref 98–111)
GFR calc non Af Amer: >60 mL/min (ref >60)
Glucose, Bld: 248 mg/dL — ABNORMAL HIGH (ref 70–99)
Potassium: 3.3 mmol/L — ABNORMAL LOW (ref 3.5–5.1)
Sodium: 141 mmol/L (ref 135–145)

## 2017-09-21 LAB — I-STAT TROPONIN, ED: TROPONIN I, POC: 0.03 ng/mL (ref 0.00–0.08)

## 2017-09-21 NOTE — ED Triage Notes (Signed)
Pt called from triage with no answer 

## 2017-09-21 NOTE — ED Triage Notes (Signed)
Patient c/o right arm pain radiating to central chest intermittently x4 weeks. Describes pain as squeezing. Denies SOB and N/V.

## 2017-09-22 ENCOUNTER — Emergency Department (HOSPITAL_COMMUNITY)
Admission: EM | Admit: 2017-09-22 | Discharge: 2017-09-22 | Disposition: A | Discharge status: Home / Self Care | Payer: Medicare Other | Attending: Emergency Medicine | Admitting: Emergency Medicine

## 2017-09-22 DIAGNOSIS — E876 Hypokalemia: Secondary | ICD-10-CM | POA: Diagnosis not present

## 2017-09-22 DIAGNOSIS — I509 Heart failure, unspecified: Secondary | ICD-10-CM | POA: Diagnosis not present

## 2017-09-22 NOTE — ED Notes (Signed)
Pt called from triage with no answer 

## 2017-09-22 NOTE — ED Notes (Signed)
Follow up call made  Pt has appointment w pcp this pm   09/22/17 1000 s Seraphina Mitchner rn

## 2017-10-05 ENCOUNTER — Inpatient Hospital Stay (HOSPITAL_COMMUNITY)
Admission: AD | Admit: 2017-10-05 | Discharge: 2017-10-07 | DRG: 247 | Disposition: A | Discharge status: Home / Self Care | Payer: Medicare Other | Source: Ambulatory Visit | Attending: Cardiology | Admitting: Cardiology

## 2017-10-05 ENCOUNTER — Encounter: Payer: Self-pay | Admitting: Cardiology

## 2017-10-05 DIAGNOSIS — Z6835 Body mass index (BMI) 35.0-35.9, adult: Secondary | ICD-10-CM

## 2017-10-05 DIAGNOSIS — E785 Hyperlipidemia, unspecified: Secondary | ICD-10-CM | POA: Diagnosis present

## 2017-10-05 DIAGNOSIS — Z7983 Long term (current) use of bisphosphonates: Secondary | ICD-10-CM

## 2017-10-05 DIAGNOSIS — I200 Unstable angina: Secondary | ICD-10-CM | POA: Diagnosis present

## 2017-10-05 DIAGNOSIS — F17210 Nicotine dependence, cigarettes, uncomplicated: Secondary | ICD-10-CM | POA: Diagnosis not present

## 2017-10-05 DIAGNOSIS — I25110 Atherosclerotic heart disease of native coronary artery with unstable angina pectoris: Secondary | ICD-10-CM | POA: Diagnosis not present

## 2017-10-05 DIAGNOSIS — E119 Type 2 diabetes mellitus without complications: Secondary | ICD-10-CM | POA: Diagnosis present

## 2017-10-05 DIAGNOSIS — I10 Essential (primary) hypertension: Secondary | ICD-10-CM | POA: Diagnosis not present

## 2017-10-05 DIAGNOSIS — K219 Gastro-esophageal reflux disease without esophagitis: Secondary | ICD-10-CM | POA: Diagnosis present

## 2017-10-05 DIAGNOSIS — Z7984 Long term (current) use of oral hypoglycemic drugs: Secondary | ICD-10-CM | POA: Diagnosis not present

## 2017-10-05 DIAGNOSIS — E669 Obesity, unspecified: Secondary | ICD-10-CM | POA: Diagnosis not present

## 2017-10-05 DIAGNOSIS — I700 Atherosclerosis of aorta: Secondary | ICD-10-CM | POA: Diagnosis present

## 2017-10-05 DIAGNOSIS — E876 Hypokalemia: Secondary | ICD-10-CM | POA: Diagnosis present

## 2017-10-05 DIAGNOSIS — Z85828 Personal history of other malignant neoplasm of skin: Secondary | ICD-10-CM | POA: Diagnosis not present

## 2017-10-05 DIAGNOSIS — I2510 Atherosclerotic heart disease of native coronary artery without angina pectoris: Secondary | ICD-10-CM | POA: Diagnosis not present

## 2017-10-05 DIAGNOSIS — E782 Mixed hyperlipidemia: Secondary | ICD-10-CM

## 2017-10-05 DIAGNOSIS — M1711 Unilateral primary osteoarthritis, right knee: Secondary | ICD-10-CM | POA: Diagnosis not present

## 2017-10-05 DIAGNOSIS — I214 Non-ST elevation (NSTEMI) myocardial infarction: Secondary | ICD-10-CM | POA: Diagnosis not present

## 2017-10-05 DIAGNOSIS — Z955 Presence of coronary angioplasty implant and graft: Secondary | ICD-10-CM

## 2017-10-05 DIAGNOSIS — E1169 Type 2 diabetes mellitus with other specified complication: Secondary | ICD-10-CM

## 2017-10-05 HISTORY — DX: Other chronic pain: G89.29

## 2017-10-05 HISTORY — DX: Obesity, unspecified: E66.9

## 2017-10-05 HISTORY — DX: Type 2 diabetes mellitus without complications: E11.9

## 2017-10-05 HISTORY — DX: Low back pain, unspecified: M54.50

## 2017-10-05 HISTORY — DX: Hyperlipidemia, unspecified: E78.5

## 2017-10-05 HISTORY — DX: Atherosclerotic heart disease of native coronary artery without angina pectoris: I25.10

## 2017-10-05 HISTORY — DX: Atherosclerosis of aorta: I70.0

## 2017-10-05 HISTORY — DX: Low back pain: M54.5

## 2017-10-05 LAB — CBC WITH DIFFERENTIAL/PLATELET
ABS IMMATURE GRANULOCYTES: 0.1 10*3/uL (ref 0.0–0.1)
BASOS ABS: 0 10*3/uL (ref 0.0–0.1)
BASOS PCT: 0 %
EOS PCT: 3 %
Eosinophils Absolute: 0.3 10*3/uL (ref 0.0–0.7)
HCT: 42.2 % (ref 36.0–46.0)
HEMOGLOBIN: 13.2 g/dL (ref 12.0–15.0)
Immature Granulocytes: 1 %
LYMPHS PCT: 35 %
Lymphs Abs: 3.8 10*3/uL (ref 0.7–4.0)
MCH: 26.8 pg (ref 26.0–34.0)
MCHC: 31.3 g/dL (ref 30.0–36.0)
MCV: 85.6 fL (ref 78.0–100.0)
Monocytes Absolute: 0.7 10*3/uL (ref 0.1–1.0)
Monocytes Relative: 7 %
NEUTROS ABS: 5.8 10*3/uL (ref 1.7–7.7)
Neutrophils Relative %: 54 %
PLATELETS: 355 10*3/uL (ref 150–400)
RBC: 4.93 MIL/uL (ref 3.87–5.11)
RDW: 14.6 % (ref 11.5–15.5)
WBC: 10.7 10*3/uL — AB (ref 4.0–10.5)

## 2017-10-05 LAB — COMPREHENSIVE METABOLIC PANEL
ALBUMIN: 3.3 g/dL — AB (ref 3.5–5.0)
ALT: 11 U/L (ref 0–44)
ANION GAP: 12 (ref 5–15)
AST: 16 U/L (ref 15–41)
Alkaline Phosphatase: 80 U/L (ref 38–126)
BUN: 16 mg/dL (ref 8–23)
CO2: 28 mmol/L (ref 22–32)
Calcium: 8.9 mg/dL (ref 8.9–10.3)
Chloride: 103 mmol/L (ref 98–111)
Creatinine, Ser: 1.13 mg/dL — ABNORMAL HIGH (ref 0.44–1.00)
GFR calc non Af Amer: 47 mL/min — ABNORMAL LOW (ref >60)
GFR, EST AFRICAN AMERICAN: 55 mL/min — AB (ref >60)
GLUCOSE: 101 mg/dL — AB (ref 70–99)
POTASSIUM: 3 mmol/L — AB (ref 3.5–5.1)
SODIUM: 143 mmol/L (ref 135–145)
Total Bilirubin: 0.7 mg/dL (ref 0.3–1.2)
Total Protein: 6.3 g/dL — ABNORMAL LOW (ref 6.5–8.1)

## 2017-10-05 LAB — TROPONIN I: TROPONIN I: 0.17 ng/mL — AB (ref <0.03)

## 2017-10-05 LAB — LIPID PANEL
Cholesterol: 212 mg/dL — ABNORMAL HIGH (ref 0–200)
HDL: 51 mg/dL (ref >40)
LDL CALC: 91 mg/dL (ref 0–99)
Total CHOL/HDL Ratio: 4.2 RATIO
Triglycerides: 350 mg/dL — ABNORMAL HIGH (ref <150)
VLDL: 70 mg/dL — ABNORMAL HIGH (ref 0–40)

## 2017-10-05 LAB — TSH: TSH: 2.677 u[IU]/mL (ref 0.350–4.500)

## 2017-10-05 LAB — GLUCOSE, CAPILLARY
GLUCOSE-CAPILLARY: 110 mg/dL — AB (ref 70–99)
Glucose-Capillary: 112 mg/dL — ABNORMAL HIGH (ref 70–99)

## 2017-10-05 MED ORDER — LISINOPRIL 10 MG PO TABS
20.0000 mg | ORAL_TABLET | Freq: Every day | ORAL | Status: DC
  Administered 2017-10-06 – 2017-10-07 (×2): 20 mg via ORAL
  Filled 2017-10-05: qty 2
  Filled 2017-10-05: qty 1

## 2017-10-05 MED ORDER — ACETAMINOPHEN 325 MG PO TABS
650.0000 mg | ORAL_TABLET | ORAL | Status: DC | PRN
  Administered 2017-10-06: 650 mg via ORAL
  Filled 2017-10-05: qty 2

## 2017-10-05 MED ORDER — ASPIRIN EC 81 MG PO TBEC
81.0000 mg | DELAYED_RELEASE_TABLET | Freq: Every day | ORAL | Status: DC
  Administered 2017-10-05 – 2017-10-07 (×2): 81 mg via ORAL
  Filled 2017-10-05 (×3): qty 1

## 2017-10-05 MED ORDER — SODIUM CHLORIDE 0.9% FLUSH
3.0000 mL | Freq: Two times a day (BID) | INTRAVENOUS | Status: DC
  Administered 2017-10-05 – 2017-10-06 (×2): 3 mL via INTRAVENOUS

## 2017-10-05 MED ORDER — HEPARIN BOLUS VIA INFUSION
4000.0000 [IU] | Freq: Once | INTRAVENOUS | Status: AC
  Administered 2017-10-05: 4000 [IU] via INTRAVENOUS
  Filled 2017-10-05: qty 4000

## 2017-10-05 MED ORDER — ASPIRIN 81 MG PO CHEW
81.0000 mg | CHEWABLE_TABLET | ORAL | Status: AC
  Administered 2017-10-06: 81 mg via ORAL
  Filled 2017-10-05: qty 1

## 2017-10-05 MED ORDER — DULOXETINE HCL 60 MG PO CPEP
60.0000 mg | ORAL_CAPSULE | Freq: Every day | ORAL | Status: DC
  Administered 2017-10-06 – 2017-10-07 (×2): 60 mg via ORAL
  Filled 2017-10-05 (×2): qty 1

## 2017-10-05 MED ORDER — METOPROLOL TARTRATE 25 MG PO TABS
25.0000 mg | ORAL_TABLET | Freq: Two times a day (BID) | ORAL | Status: DC
  Administered 2017-10-05 – 2017-10-07 (×4): 25 mg via ORAL
  Filled 2017-10-05 (×4): qty 1

## 2017-10-05 MED ORDER — SODIUM CHLORIDE 0.9 % IV SOLN: 250.0000 mL | INTRAVENOUS | Status: DC | PRN

## 2017-10-05 MED ORDER — PANTOPRAZOLE SODIUM 40 MG PO TBEC
40.0000 mg | DELAYED_RELEASE_TABLET | Freq: Every day | ORAL | Status: DC
  Administered 2017-10-06 – 2017-10-07 (×2): 40 mg via ORAL
  Filled 2017-10-05 (×2): qty 1

## 2017-10-05 MED ORDER — SODIUM CHLORIDE 0.9% FLUSH: 3.0000 mL | INTRAVENOUS | Status: DC | PRN

## 2017-10-05 MED ORDER — ATORVASTATIN CALCIUM 40 MG PO TABS
40.0000 mg | ORAL_TABLET | Freq: Every day | ORAL | Status: DC
  Administered 2017-10-05 – 2017-10-06 (×2): 40 mg via ORAL
  Filled 2017-10-05 (×2): qty 1

## 2017-10-05 MED ORDER — ONDANSETRON HCL 4 MG/2ML IJ SOLN: 4.0000 mg | Freq: Four times a day (QID) | INTRAMUSCULAR | Status: DC | PRN

## 2017-10-05 MED ORDER — HEPARIN (PORCINE) IN NACL 100-0.45 UNIT/ML-% IJ SOLN
1150.0000 [IU]/h | INTRAMUSCULAR | Status: DC
  Administered 2017-10-05: 950 [IU]/h via INTRAVENOUS
  Filled 2017-10-05: qty 250

## 2017-10-05 MED ORDER — NITROGLYCERIN 0.4 MG SL SUBL: 0.4000 mg | SUBLINGUAL_TABLET | SUBLINGUAL | Status: DC | PRN

## 2017-10-05 MED ORDER — SODIUM CHLORIDE 0.9 % WEIGHT BASED INFUSION
3.0000 mL/kg/h | INTRAVENOUS | Status: DC
  Administered 2017-10-06: 3 mL/kg/h via INTRAVENOUS

## 2017-10-05 MED ORDER — GLIPIZIDE ER 5 MG PO TB24
5.0000 mg | ORAL_TABLET | Freq: Every day | ORAL | Status: DC
  Administered 2017-10-07: 5 mg via ORAL
  Filled 2017-10-05: qty 1

## 2017-10-05 MED ORDER — SODIUM CHLORIDE 0.9 % WEIGHT BASED INFUSION
1.0000 mL/kg/h | INTRAVENOUS | Status: DC
  Administered 2017-10-06: 1 mL/kg/h via INTRAVENOUS

## 2017-10-05 NOTE — H&P (Signed)
History and Physical   Admit date: 10/05/2017 Name:  Kathryn Bruce Medical record number: [id number] DOB/Age:  1945/09/18  72 y.o. female  Referring Physician:   Dr. Darcus Austin  Primary Cardiologist: Wynonia Lawman  Primary Physician:   Dr. Darcus Austin  Chief complaint/reason for admission: Chest pain  HPI:  This 72 year old female has a history of hypertension obesity and diabetes.  She was seen over a year ago with vague chest discomfort as well as smoking.  She had a myocardial perfusion scan done in April 2018 but did not show any significant ischemia.  She was advised to stop smoking and modify risk factors.  She represented to the office with a 6-week history of discomfort in her chest.  She describes it is almost daily and describes discomfort in her right arm that radiates to her throat and is described as a pressure and is in her mid chest area.  It will last for variable periods of time 10 to 15 minutes and often times she will stop and rest.  It is not definitely related to exertion.  If the pain is not pleuritic.  She had a particularly severe episode and went to the emergency room on July 15 and left without being seen.  She later saw her primary doctor and is seen here today.  She had discomfort that occurred at rest yesterday.  When seen in the office she was noted to have T wave changes noted in the anterior leads and these are new since the previous EKG and because of the history as well as new EKG changes is admitted to the hospital with suspected unstable angina.  She denies PND orthopnea or claudication.  Continues to smoke.   Past Medical History:  Diagnosis Date  . Arthritis   . Cancer ([place])    SKIN CANCDER REMOVED FROM NOSE   . Diabetes mellitus   . GERD (gastroesophageal reflux disease)   . Hidradenitis   . Hypercholesteremia   . Hypertension   . Osteoarthritis (arthritis due to wear and tear of joints)    right knee  . Sleep apnea    STOP BANG SCORE 5     Past  Surgical History:  Procedure Laterality Date  . DENTAL SURGERY    . TONSILLECTOMY  1963  . VENTRAL HERNIA REPAIR  09/04/2011   Procedure: LAPAROSCOPIC VENTRAL HERNIA;  Surgeon: Gayland Curry, MD,FACS;  Location: [facility];  Service: General;  Laterality: N/A;  LAPAROSCOPIC incarcerated VENTRAL HERNIA  repair    Allergies: has No Known Allergies.   Medications: Prior to Admission medications   Medication Sig Start Date End Date Taking? Authorizing Provider  acetaminophen (TYLENOL) 500 MG tablet Take 500 mg by mouth as needed. pain    [provider]  alendronate (FOSAMAX) 70 MG tablet Take 70 mg by mouth every 7 (seven) days. 07/01/17   [provider]  aspirin 81 MG tablet Take 81 mg by mouth daily.    [provider]  atorvastatin (LIPITOR) 40 MG tablet Take 40 mg by mouth daily. 08/23/17   [provider]  CYMBALTA 30 MG capsule Take 60 mg by mouth daily.  10/29/11   [provider]  glipiZIDE (GLUCOTROL XL) 5 MG 24 hr tablet Take 5 mg by mouth daily. 08/24/17   [provider]  lisinopril-hydrochlorothiazide (PRINZIDE,ZESTORETIC) 20-25 MG per tablet Take 1 tablet by mouth daily.    [provider]  metFORMIN (GLUCOPHAGE) 500 MG tablet Take 1,000 mg by mouth 2 (  two) times daily with a meal.    [provider]  omeprazole (PRILOSEC) 40 MG capsule Take 40 mg by mouth daily. 08/24/17   [provider]  Vitamin D, Ergocalciferol, (DRISDOL) 50000 units CAPS capsule Take 50,000 Units by mouth every 7 (seven) days. 09/18/17   [provider]   Family History:  Family Status  Relation Name Status  . Mother  Deceased  . Father  Deceased  . Sister  (Not Specified)  . Brother  (Not Specified)  . MGF  (Not Specified)   Social History:   reports that she has been smoking cigarettes.  She has a 40.00 pack-year smoking history. She has never used smokeless tobacco. She reports that she does not drink alcohol or use  drugs.   Currently a widow, lives with a female friend.  Continues to smoke cigarettes.   Review of Systems: She has been obese for many years.  Has significant low back pain and also has symptoms of reflux.  She has urinary frequency and significant arthritis of her knees. Other than as noted above, the remainder of the review of systems is normal  Physical Exam: Vital signs: Blood pressure 130/90 sitting in the left arm and 138/90 standing in the left arm, weight 218 pounds height 65 inches pulse 88 BMI 36.27 General appearance: Pleasant white female obese currently in no acute distress Head: Normocephalic, without obvious abnormality, atraumatic Eyes: conjunctivae/corneas clear. PERRL, EOM's intact. Fundi benign. Neck: no adenopathy, no carotid bruit, no JVD and supple, symmetrical, trachea midline Lungs: clear to auscultation bilaterally Heart: regular rate and rhythm, S1, S2 normal, no murmur, click, rub or gallop Abdomen: soft, non-tender; bowel sounds normal; no masses,  no organomegaly Pelvic: deferred Extremities: extremities normal, atraumatic, no cyanosis or edema Pulses: 2+ and symmetric Lymph nodes: Cervical, supraclavicular, and axillary nodes normal. Neurologic: Grossly normal  Labs: Not available at the time of dictation EKG: T wave inversions noted in the anterior leads new from previous EKG Independently reviewed by me  Radiology: Previous CT scan showed calcification in the LAD and circumflex coronary arteries, chest x-ray pending at the time of dictation   IMPRESSIONS: 1.  Unstable angina by history 2.  Coronary artery disease as manifested by calcification on screening CT scan 3.  Atherosclerosis of aorta 4.  Obesity 5.  Hypertension 6.  Hyperlipidemia 7.  Diabetes mellitus without complications 8.  Reflux  PLAN: Patient has had almost daily symptoms with T wave changes noted in the anterior leads.  She will be brought in and placed on intravenous  heparin.  Troponins and lab will be done.  Plan cardiac catheterization for further assessment.  Cardiac catheterization was discussed with the patient fully including risks of myocardial infarction, death, stroke, bleeding, arrhythmia, dye allergy, renal insufficiency or bleeding.  The patient understands and is willing to proceed.  Possibility of intervention at the same time also discussed with patient and she understands and is agreeable to proceed  Signed: W. Doristine Church MD [facility]  10/05/2017, 12:43 PM

## 2017-10-05 NOTE — Progress Notes (Signed)
ANTICOAGULATION CONSULT NOTE - Initial Consult  Pharmacy Consult for heparin Indication: chest pain/ACS  No Known Allergies  Patient Measurements:   Heparin Dosing Weight: 79kg  Vital Signs:    Labs: No results for input(s): HGB, HCT, PLT, APTT, LABPROT, INR, HEPARINUNFRC, HEPRLOWMOCWT, CREATININE, CKTOTAL, CKMB, TROPONINI in the last 72 hours.  CrCl cannot be calculated (Unknown ideal weight.).   Medical History: Past Medical History:  Diagnosis Date  . Arthritis   . Cancer ([place])    SKIN CANCDER REMOVED FROM NOSE   . Diabetes mellitus   . GERD (gastroesophageal reflux disease)   . Hidradenitis   . Hypercholesteremia   . Hypertension   . Osteoarthritis (arthritis due to wear and tear of joints)    right knee  . Sleep apnea    STOP BANG SCORE 5     Assessment:  1 yoF admitted with CP to undergo LHC and start IV heparin. No OAC noted PTA, recent CBC wnl.  Goal of Therapy:  Heparin level 0.3-0.7 units/ml Monitor platelets by anticoagulation protocol: Yes   Plan:  -Heparin 4000 units x1 -Heparin 950 units/hr -Check 8-hr heparin level -Monitor heparin level, CBC, S/Sx bleeding daily  Arrie Senate, PharmD, BCPS Clinical Pharmacist [phone] Please check AMION for all [facility] numbers 10/05/2017

## 2017-10-06 ENCOUNTER — Ambulatory Visit (HOSPITAL_COMMUNITY): Admission: RE | Admit: 2017-10-06 | Payer: Medicare Other | Source: Ambulatory Visit | Admitting: Cardiovascular Disease

## 2017-10-06 ENCOUNTER — Encounter (HOSPITAL_COMMUNITY): Payer: Self-pay | Admitting: General Practice

## 2017-10-06 ENCOUNTER — Other Ambulatory Visit: Payer: Self-pay

## 2017-10-06 ENCOUNTER — Encounter (HOSPITAL_COMMUNITY)
Admission: AD | Disposition: A | Discharge status: Home / Self Care | Payer: Self-pay | Source: Ambulatory Visit | Attending: Cardiology

## 2017-10-06 DIAGNOSIS — I2510 Atherosclerotic heart disease of native coronary artery without angina pectoris: Secondary | ICD-10-CM

## 2017-10-06 DIAGNOSIS — I214 Non-ST elevation (NSTEMI) myocardial infarction: Principal | ICD-10-CM

## 2017-10-06 HISTORY — PX: CORONARY ANGIOPLASTY WITH STENT PLACEMENT: SHX49

## 2017-10-06 HISTORY — PX: LEFT HEART CATH AND CORONARY ANGIOGRAPHY: CATH118249

## 2017-10-06 HISTORY — PX: CORONARY STENT INTERVENTION: CATH118234

## 2017-10-06 LAB — HEPARIN LEVEL (UNFRACTIONATED): Heparin Unfractionated: 0.13 IU/mL — ABNORMAL LOW (ref 0.30–0.70)

## 2017-10-06 LAB — CBC
HCT: 39.3 % (ref 36.0–46.0)
HEMOGLOBIN: 12.3 g/dL (ref 12.0–15.0)
MCH: 26.7 pg (ref 26.0–34.0)
MCHC: 31.3 g/dL (ref 30.0–36.0)
MCV: 85.4 fL (ref 78.0–100.0)
PLATELETS: 303 10*3/uL (ref 150–400)
RBC: 4.6 MIL/uL (ref 3.87–5.11)
RDW: 14.4 % (ref 11.5–15.5)
WBC: 11.5 10*3/uL — AB (ref 4.0–10.5)

## 2017-10-06 LAB — TROPONIN I
TROPONIN I: 0.09 ng/mL — AB (ref <0.03)
Troponin I: 0.2 ng/mL (ref <0.03)

## 2017-10-06 LAB — GLUCOSE, CAPILLARY
GLUCOSE-CAPILLARY: 133 mg/dL — AB (ref 70–99)
Glucose-Capillary: 97 mg/dL (ref 70–99)
Glucose-Capillary: 127 mg/dL — ABNORMAL HIGH (ref 70–99)
Glucose-Capillary: 122 mg/dL — ABNORMAL HIGH (ref 70–99)

## 2017-10-06 LAB — BASIC METABOLIC PANEL
ANION GAP: 12 (ref 5–15)
BUN: 19 mg/dL (ref 8–23)
CO2: 27 mmol/L (ref 22–32)
Calcium: 9 mg/dL (ref 8.9–10.3)
Chloride: 103 mmol/L (ref 98–111)
Creatinine, Ser: 0.71 mg/dL (ref 0.44–1.00)
GFR calc Af Amer: >60 mL/min (ref >60)
GFR calc non Af Amer: >60 mL/min (ref >60)
GLUCOSE: 86 mg/dL (ref 70–99)
POTASSIUM: 3 mmol/L — AB (ref 3.5–5.1)
Sodium: 142 mmol/L (ref 135–145)

## 2017-10-06 LAB — POCT ACTIVATED CLOTTING TIME: ACTIVATED CLOTTING TIME: 340 s

## 2017-10-06 SURGERY — LEFT HEART CATH AND CORONARY ANGIOGRAPHY
Anesthesia: LOCAL

## 2017-10-06 MED ORDER — SODIUM CHLORIDE 0.9% FLUSH
3.0000 mL | Freq: Two times a day (BID) | INTRAVENOUS | Status: DC
  Administered 2017-10-06: 18:00:00 3 mL via INTRAVENOUS

## 2017-10-06 MED ORDER — VERAPAMIL HCL 2.5 MG/ML IV SOLN
INTRAVENOUS | Status: AC
  Filled 2017-10-06: qty 2

## 2017-10-06 MED ORDER — LIDOCAINE HCL (PF) 1 % IJ SOLN
INTRAMUSCULAR | Status: AC
  Filled 2017-10-06: qty 30

## 2017-10-06 MED ORDER — TRAMADOL HCL 50 MG PO TABS
50.0000 mg | ORAL_TABLET | Freq: Four times a day (QID) | ORAL | Status: DC | PRN
  Administered 2017-10-06: 50 mg via ORAL
  Filled 2017-10-06: qty 1

## 2017-10-06 MED ORDER — SODIUM CHLORIDE 0.9% FLUSH: 3.0000 mL | INTRAVENOUS | Status: DC | PRN

## 2017-10-06 MED ORDER — HEPARIN (PORCINE) IN NACL 1000-0.9 UT/500ML-% IV SOLN
INTRAVENOUS | Status: DC | PRN
  Administered 2017-10-06 (×2): 500 mL

## 2017-10-06 MED ORDER — MIDAZOLAM HCL 2 MG/2ML IJ SOLN
INTRAMUSCULAR | Status: AC
  Filled 2017-10-06: qty 2

## 2017-10-06 MED ORDER — IOHEXOL 350 MG/ML SOLN
INTRAVENOUS | Status: DC | PRN
  Administered 2017-10-06: 130 mL via INTRA_ARTERIAL

## 2017-10-06 MED ORDER — HEPARIN SODIUM (PORCINE) 1000 UNIT/ML IJ SOLN
INTRAMUSCULAR | Status: AC
  Filled 2017-10-06: qty 1

## 2017-10-06 MED ORDER — FENTANYL CITRATE (PF) 100 MCG/2ML IJ SOLN
INTRAMUSCULAR | Status: DC | PRN
  Administered 2017-10-06: 25 ug via INTRAVENOUS

## 2017-10-06 MED ORDER — TICAGRELOR 90 MG PO TABS
90.0000 mg | ORAL_TABLET | Freq: Two times a day (BID) | ORAL | Status: DC
  Administered 2017-10-06 – 2017-10-07 (×2): 90 mg via ORAL
  Filled 2017-10-06 (×2): qty 1

## 2017-10-06 MED ORDER — TICAGRELOR 90 MG PO TABS
ORAL_TABLET | ORAL | Status: AC
  Filled 2017-10-06: qty 2

## 2017-10-06 MED ORDER — LIDOCAINE HCL (PF) 1 % IJ SOLN
INTRAMUSCULAR | Status: DC | PRN
  Administered 2017-10-06: 2 mL via INTRADERMAL

## 2017-10-06 MED ORDER — HEPARIN (PORCINE) IN NACL 1000-0.9 UT/500ML-% IV SOLN
INTRAVENOUS | Status: AC
  Filled 2017-10-06: qty 1000

## 2017-10-06 MED ORDER — SODIUM CHLORIDE 0.9 % IV SOLN
INTRAVENOUS | Status: AC
  Administered 2017-10-06: 14:00:00 via INTRAVENOUS

## 2017-10-06 MED ORDER — SODIUM CHLORIDE 0.9 % IV SOLN: 250.0000 mL | INTRAVENOUS | Status: DC | PRN

## 2017-10-06 MED ORDER — VERAPAMIL HCL 2.5 MG/ML IV SOLN
INTRAVENOUS | Status: DC | PRN
  Administered 2017-10-06: 10 mL via INTRA_ARTERIAL

## 2017-10-06 MED ORDER — NICOTINE 21 MG/24HR TD PT24
21.0000 mg | MEDICATED_PATCH | Freq: Every day | TRANSDERMAL | Status: DC
  Administered 2017-10-06: 21 mg via TRANSDERMAL
  Filled 2017-10-06 (×2): qty 1

## 2017-10-06 MED ORDER — HEPARIN SODIUM (PORCINE) 1000 UNIT/ML IJ SOLN
INTRAMUSCULAR | Status: DC | PRN
  Administered 2017-10-06: 7000 [IU] via INTRAVENOUS
  Administered 2017-10-06: 5000 [IU] via INTRAVENOUS

## 2017-10-06 MED ORDER — FENTANYL CITRATE (PF) 100 MCG/2ML IJ SOLN
INTRAMUSCULAR | Status: AC
  Filled 2017-10-06: qty 2

## 2017-10-06 MED ORDER — HYDRALAZINE HCL 20 MG/ML IJ SOLN: 5.0000 mg | INTRAMUSCULAR | Status: AC | PRN

## 2017-10-06 MED ORDER — POTASSIUM CHLORIDE 20 MEQ/15ML (10%) PO SOLN
40.0000 meq | Freq: Once | ORAL | Status: AC
  Administered 2017-10-06: 40 meq via ORAL
  Filled 2017-10-06: qty 30

## 2017-10-06 MED ORDER — LABETALOL HCL 5 MG/ML IV SOLN: 10.0000 mg | INTRAVENOUS | Status: AC | PRN

## 2017-10-06 MED ORDER — ANGIOPLASTY BOOK
Freq: Once | Status: DC
  Filled 2017-10-06: qty 1

## 2017-10-06 MED ORDER — HEPARIN BOLUS VIA INFUSION
2000.0000 [IU] | Freq: Once | INTRAVENOUS | Status: AC
  Administered 2017-10-06: 2000 [IU] via INTRAVENOUS
  Filled 2017-10-06: qty 2000

## 2017-10-06 MED ORDER — MIDAZOLAM HCL 2 MG/2ML IJ SOLN
INTRAMUSCULAR | Status: DC | PRN
  Administered 2017-10-06: 1 mg via INTRAVENOUS

## 2017-10-06 MED ORDER — TICAGRELOR 90 MG PO TABS
ORAL_TABLET | ORAL | Status: DC | PRN
  Administered 2017-10-06: 180 mg via ORAL

## 2017-10-06 SURGICAL SUPPLY — 19 items
BALLN EMERGE MR 2.5X12 (BALLOONS) ×2
BALLN ~~LOC~~ EMERGE MR 3.5X12 (BALLOONS) ×2
BALLOON EMERGE MR 2.5X12 (BALLOONS) IMPLANT
BALLOON ~~LOC~~ EMERGE MR 3.5X12 (BALLOONS) IMPLANT
CATH 5FR JL3.5 JR4 ANG PIG MP (CATHETERS) ×1 IMPLANT
CATH VISTA GUIDE 6FR XBLAD3.5 (CATHETERS) ×1 IMPLANT
DEVICE RAD COMP TR BAND LRG (VASCULAR PRODUCTS) ×1 IMPLANT
GLIDESHEATH SLEND SS 6F .021 (SHEATH) ×1 IMPLANT
GUIDEWIRE INQWIRE 1.5J.035X260 (WIRE) IMPLANT
INQWIRE 1.5J .035X260CM (WIRE) ×2
KIT ENCORE 26 ADVANTAGE (KITS) ×1 IMPLANT
KIT HEART LEFT (KITS) ×2 IMPLANT
PACK CARDIAC CATHETERIZATION (CUSTOM PROCEDURE TRAY) ×2 IMPLANT
STENT SIERRA 3.50 X 15 MM (Permanent Stent) ×1 IMPLANT
SYR MEDRAD MARK V 150ML (SYRINGE) ×2 IMPLANT
TRANSDUCER W/STOPCOCK (MISCELLANEOUS) ×2 IMPLANT
TUBING CIL FLEX 10 FLL-RA (TUBING) ×2 IMPLANT
WIRE COUGAR XT STRL 190CM (WIRE) ×1 IMPLANT
WIRE HI TORQ VERSACORE-J 145CM (WIRE) ×1 IMPLANT

## 2017-10-06 NOTE — Progress Notes (Signed)
TR BAND REMOVAL  LOCATION:  right radial  DEFLATED PER PROTOCOL:  Yes.    TIME BAND OFF / DRESSING APPLIED:   1615   SITE UPON ARRIVAL:   Level 0  SITE AFTER BAND REMOVAL:  Level 0  CIRCULATION SENSATION AND MOVEMENT:  Within Normal Limits  Yes.    COMMENTS:

## 2017-10-06 NOTE — H&P (View-Only) (Signed)
Subjective:  Had additional chest pain while watering plants prior to coming into hospital.  None since then.   Objective:  Vital Signs in the last 24 hours: BP 140/68 (BP Location: Right Arm)   Pulse 66   Temp 98.5 F (36.9 C) (Oral)   Resp 18   Ht 5' 5.5" (1.664 m)   Wt 98.7 kg (217 lb 8 oz) Comment: scale a  SpO2 98%   BMI 35.64 kg/m   Physical Exam: Obese WF in NAD Lungs:  Clear Cardiac:  Regular rhythm, normal S1 and S2, no S3 Extremities:  No edema present  Intake/Output from previous day: 07/29 0701 - 07/30 0700 In: 1158.7 [P.O.:240; I.V.:918.7] Out: 1125 [Urine:1125]  Weight Filed Weights   10/05/17 1600 10/05/17 1626 10/06/17 0334  Weight: 98.9 kg (218 lb) 98.9 kg (218 lb) 98.7 kg (217 lb 8 oz)    Lab Results: Basic Metabolic Panel: Recent Labs    10/05/17 1634 10/06/17 0433  NA 143 142  K 3.0* 3.0*  CL 103 103  CO2 28 27  GLUCOSE 101* 86  BUN 16 19  CREATININE 1.13* 0.71   CBC: Recent Labs    10/05/17 1634 10/06/17 0433  WBC 10.7* 11.5*  NEUTROABS 5.8  --   HGB 13.2 12.3  HCT 42.2 39.3  MCV 85.6 85.4  PLT 355 303   Cardiac Enzymes: Troponin (Point of Care Test) Cardiac Panel (last 3 results) Recent Labs    10/05/17 1634 10/05/17 2214 10/06/17 0433  TROPONINI 0.09* 0.17* 0.20*    Telemetry: Reviewed   Assessment/Plan:  1. UAP, nonSTEMI 2. Hypokalemia 3. Obesity 4. Hypertension  Plan,  Additional K this am.  Cardiac cath later today.    Kerry Hough  MD [facility]  10/06/2017, 9:02 AM

## 2017-10-06 NOTE — Progress Notes (Signed)
Pt to cath lab via bed, heparin qtt stopped per natalie in cath lab. Pt denies cp or sob

## 2017-10-06 NOTE — Plan of Care (Signed)

## 2017-10-06 NOTE — Interval H&P Note (Signed)
History and Physical Interval Note:  10/06/2017 10:47 AM  Kathryn Bruce  has presented today for cardiac cath with the diagnosis of NSTEMI. The various methods of treatment have been discussed with the patient and family. After consideration of risks, benefits and other options for treatment, the patient has consented to  Procedure(s): LEFT HEART CATH AND CORONARY ANGIOGRAPHY (N/A) as a surgical intervention .  The patient's history has been reviewed, patient examined, no change in status, stable for surgery.  I have reviewed the patient's chart and labs.  Questions were answered to the patient's satisfaction.   Cath Lab Visit (complete for each Cath Lab visit)  Clinical Evaluation Leading to the Procedure:   ACS: Yes.    Non-ACS:    Anginal Classification: CCS III  Anti-ischemic medical therapy: No Therapy  Non-Invasive Test Results: No non-invasive testing performed  Prior CABG: No previous CABG         [name]

## 2017-10-06 NOTE — Progress Notes (Signed)
[facility] for heparin Indication: chest pain/ACS  No Known Allergies  Patient Measurements: Height: 5' 5.5" (166.4 cm) Weight: 218 lb (98.9 kg)(scale A) IBW/kg (Calculated) : 58.15 Heparin Dosing Weight: 79kg  Vital Signs: Temp: 98.2 F (36.8 C) (07/29 1925) Temp Source: Oral (07/29 1925) BP: 137/74 (07/29 1925) Pulse Rate: 82 (07/29 1925)  Labs: Recent Labs    10/05/17 1634 10/05/17 2214 10/06/17 0126  HGB 13.2  --   --   HCT 42.2  --   --   PLT 355  --   --   HEPARINUNFRC  --   --  0.13*  CREATININE 1.13*  --   --   TROPONINI 0.09* 0.17*  --     Estimated Creatinine Clearance: 52.9 mL/min (A) (by C-G formula based on SCr of 1.13 mg/dL (H)).   Medical History: Past Medical History:  Diagnosis Date  . Arthritis   . Cancer ([place])    SKIN CANCDER REMOVED FROM NOSE   . Diabetes mellitus   . GERD (gastroesophageal reflux disease)   . Hidradenitis   . Hypercholesteremia   . Hypertension   . Osteoarthritis (arthritis due to wear and tear of joints)    right knee  . Sleep apnea    STOP BANG SCORE 5     Assessment:  24 yoF admitted with CP to undergo LHC and start IV heparin. No OAC noted PTA, recent CBC wnl. Initial heparin level 0.13 units/ml  Goal of Therapy:  Heparin level 0.3-0.7 units/ml Monitor platelets by anticoagulation protocol: Yes   Plan:  -Heparin 2000 units x1 -Heparin 1150 units/hr -Check 8-hr heparin level -Monitor heparin level, CBC, S/Sx bleeding daily  Thanks for allowing pharmacy to be a part of this patient's care.  Excell Seltzer, PharmD Clinical Pharmacist

## 2017-10-06 NOTE — Progress Notes (Signed)
Subjective:  Had additional chest pain while watering plants prior to coming into hospital.  None since then.   Objective:  Vital Signs in the last 24 hours: BP 140/68 (BP Location: Right Arm)   Pulse 66   Temp 98.5 F (36.9 C) (Oral)   Resp 18   Ht 5' 5.5" (1.664 m)   Wt 98.7 kg (217 lb 8 oz) Comment: scale a  SpO2 98%   BMI 35.64 kg/m   Physical Exam: Obese WF in NAD Lungs:  Clear Cardiac:  Regular rhythm, normal S1 and S2, no S3 Extremities:  No edema present  Intake/Output from previous day: 07/29 0701 - 07/30 0700 In: 1158.7 [P.O.:240; I.V.:918.7] Out: 1125 [Urine:1125]  Weight Filed Weights   10/05/17 1600 10/05/17 1626 10/06/17 0334  Weight: 98.9 kg (218 lb) 98.9 kg (218 lb) 98.7 kg (217 lb 8 oz)    Lab Results: Basic Metabolic Panel: Recent Labs    10/05/17 1634 10/06/17 0433  NA 143 142  K 3.0* 3.0*  CL 103 103  CO2 28 27  GLUCOSE 101* 86  BUN 16 19  CREATININE 1.13* 0.71   CBC: Recent Labs    10/05/17 1634 10/06/17 0433  WBC 10.7* 11.5*  NEUTROABS 5.8  --   HGB 13.2 12.3  HCT 42.2 39.3  MCV 85.6 85.4  PLT 355 303   Cardiac Enzymes: Troponin (Point of Care Test) Cardiac Panel (last 3 results) Recent Labs    10/05/17 1634 10/05/17 2214 10/06/17 0433  TROPONINI 0.09* 0.17* 0.20*    Telemetry: Reviewed   Assessment/Plan:  1. UAP, nonSTEMI 2. Hypokalemia 3. Obesity 4. Hypertension  Plan,  Additional K this am.  Cardiac cath later today.    Kerry Hough  MD [facility]  10/06/2017, 9:02 AM

## 2017-10-07 ENCOUNTER — Encounter (HOSPITAL_COMMUNITY): Payer: Self-pay | Admitting: Cardiovascular Disease

## 2017-10-07 LAB — CBC
HEMATOCRIT: 39 % (ref 36.0–46.0)
HEMOGLOBIN: 12.2 g/dL (ref 12.0–15.0)
MCH: 26.8 pg (ref 26.0–34.0)
MCHC: 31.3 g/dL (ref 30.0–36.0)
MCV: 85.7 fL (ref 78.0–100.0)
Platelets: 309 10*3/uL (ref 150–400)
RBC: 4.55 MIL/uL (ref 3.87–5.11)
RDW: 14.5 % (ref 11.5–15.5)
WBC: 11.7 10*3/uL — ABNORMAL HIGH (ref 4.0–10.5)

## 2017-10-07 LAB — BASIC METABOLIC PANEL
ANION GAP: 8 (ref 5–15)
BUN: 13 mg/dL (ref 8–23)
CO2: 27 mmol/L (ref 22–32)
Calcium: 8.8 mg/dL — ABNORMAL LOW (ref 8.9–10.3)
Chloride: 105 mmol/L (ref 98–111)
Creatinine, Ser: 0.66 mg/dL (ref 0.44–1.00)
GLUCOSE: 120 mg/dL — AB (ref 70–99)
POTASSIUM: 3.5 mmol/L (ref 3.5–5.1)
Sodium: 140 mmol/L (ref 135–145)

## 2017-10-07 LAB — GLUCOSE, CAPILLARY: GLUCOSE-CAPILLARY: 115 mg/dL — AB (ref 70–99)

## 2017-10-07 MED ORDER — ASPIRIN 81 MG PO TBEC: 81.0000 mg | DELAYED_RELEASE_TABLET | Freq: Every day | ORAL | Status: DC

## 2017-10-07 MED ORDER — NITROGLYCERIN 0.4 MG SL SUBL: 0.4000 mg | SUBLINGUAL_TABLET | SUBLINGUAL | 12 refills | Status: DC | PRN

## 2017-10-07 MED ORDER — METOPROLOL TARTRATE 25 MG PO TABS: 25.0000 mg | ORAL_TABLET | Freq: Two times a day (BID) | ORAL | 12 refills | Status: DC

## 2017-10-07 MED ORDER — TICAGRELOR 90 MG PO TABS: 90.0000 mg | ORAL_TABLET | Freq: Two times a day (BID) | ORAL | 12 refills | Status: DC

## 2017-10-07 NOTE — Discharge Summary (Addendum)
Physician Discharge Summary  Patient ID: Kathryn Bruce MRN: [id number] DOB/AGE: [birth year] 72 y.o.  Admit date: 10/05/2017 Discharge date: 10/07/2017  Primary Physician: Dr. Darcus Austin  Primary Discharge Diagnosis:  1.  Non-STEMI  Secondary Discharge Diagnosis: 2.  Coronary artery disease with severe stenosis in proximal LAD treated with stenting 3.  Type 2 diabetes mellitus 4.  Hypertension 5.  Hyperlipidemia 6.  Obesity  Procedures:  Cardiac catheterization with percutaneous coronary intervention with drug-eluting stent to the LAD by Dr. [facility] Course: This 72 year old female has a history of hypertension obesity and diabetes.  She was seen over a year ago with vague chest discomfort as well as smoking.  She had a myocardial perfusion scan done in April 2018 but did not show any significant ischemia.  She was advised to stop smoking and modify risk factors.  She presented to the office with a 6-week history of discomfort in her chest.  She describes it is almost daily and describes discomfort in her right arm that radiates to her throat and is described as a pressure and is in her mid chest area.  It will last for variable periods of time 10 to 15 minutes and often times she will stop and rest.  It is not definitely related to exertion.  If the pain is not pleuritic.  She had a particularly severe episode and went to the emergency room on July 15 and left without being seen.  She later saw her primary doctor and is seen in the office of the day of admission. She had discomfort that occurred at rest yesterday.  When seen in the office she was noted to have T wave changes noted in the anterior leads and these are new since the previous EKG and because of the history as well as new EKG changes is admitted to the hospital with suspected unstable angina.  She denies PND orthopnea or claudication.  Continues to smoke.  She had another episode of chest discomfort while watering her  plants at home between the interval she was seen in the office and coming to the hospital.  She was placed on intravenous heparin.  Troponins were mildly elevated and EKG showed T wave changes consistent with anterior ischemia.  She underwent catheterization by Dr. Angelena Form with findings of a severe proximal LAD stenosis.  There was mild disease noted in the circumflex as well as the right coronary artery.  Left ventricular ejection fraction was 60%.  She underwent stenting with a 3.5 x 15 mm Sierra stent to the proximal LAD with an excellent result.  She remained stable following the procedure and was placed on Brilinta 90 mg twice daily.  She was ambulatory in the hall the next day.  She was instructed in smoking cessation.  The importance of following a an excellent diet and weight loss as well as regular exercise were discussed with her.  Arrangements are made for her to have referral for cardiac rehabilitation.  Her catheterization site is clean and dry on the day of discharge.  Discharge Exam: Blood pressure (!) 117/49, pulse 63, temperature 98 F (36.7 C), resp. rate (!) 24, height 5' 5.5" (1.664 m), weight 98.4 kg (216 lb 14.9 oz), SpO2 97 %. Weight: 98.4 kg (216 lb 14.9 oz) Radial catheterization site is clean and dry  Labs: CBC:   Lab Results  Component Value Date   WBC 11.7 (H) 10/07/2017   HGB 12.2 10/07/2017   HCT 39.0 10/07/2017   MCV 85.7  10/07/2017   PLT 309 10/07/2017    CMP:  Recent Labs  Lab 10/05/17 1634  10/07/17 0241  NA 143   < > 140  K 3.0*   < > 3.5  CL 103   < > 105  CO2 28   < > 27  BUN 16   < > 13  CREATININE 1.13*   < > 0.66  CALCIUM 8.9   < > 8.8*  PROT 6.3*  --   --   BILITOT 0.7  --   --   ALKPHOS 80  --   --   ALT 11  --   --   AST 16  --   --   GLUCOSE 101*   < > 120*   < > = values in this interval not displayed.    Lipid Panel     Component Value Date/Time   CHOL 212 (H) 10/05/2017 1634   TRIG 350 (H) 10/05/2017 1634   HDL 51  10/05/2017 1634   CHOLHDL 4.2 10/05/2017 1634   VLDL 70 (H) 10/05/2017 1634   LDLCALC 91 10/05/2017 1634    Cardiac Enzymes: Recent Labs    10/05/17 1634 10/05/17 2214 10/06/17 0433  TROPONINI 0.09* 0.17* 0.20*   Thyroid: Lab Results  Component Value Date   TSH 2.677 10/05/2017   EKG: T wave changes noted in the anterior leads consistent with ischemia  Discharge Medications: Allergies as of 10/07/2017   No Known Allergies     Medication List    STOP taking these medications   aspirin 81 MG tablet Replaced by:  aspirin 81 MG EC tablet     TAKE these medications   acetaminophen 500 MG tablet Commonly known as:  TYLENOL Take 500 mg by mouth as needed. pain   alendronate 70 MG tablet Commonly known as:  FOSAMAX Take 70 mg by mouth every 7 (seven) days.   aspirin 81 MG EC tablet Take 1 tablet (81 mg total) by mouth daily. Replaces:  aspirin 81 MG tablet   atorvastatin 40 MG tablet Commonly known as:  LIPITOR Take 40 mg by mouth daily.   CYMBALTA 30 MG capsule Generic drug:  DULoxetine Take 60 mg by mouth daily.   glipiZIDE 5 MG 24 hr tablet Commonly known as:  GLUCOTROL XL Take 5 mg by mouth daily.   lisinopril-hydrochlorothiazide 20-25 MG tablet Commonly known as:  PRINZIDE,ZESTORETIC Take 1 tablet by mouth daily.   metFORMIN 500 MG tablet Commonly known as:  GLUCOPHAGE Take 1,000 mg by mouth 2 (two) times daily with a meal.   metoprolol tartrate 25 MG tablet Commonly known as:  LOPRESSOR Take 1 tablet (25 mg total) by mouth 2 (two) times daily.   nitroGLYCERIN 0.4 MG SL tablet Commonly known as:  NITROSTAT Place 1 tablet (0.4 mg total) under the tongue every 5 (five) minutes x 3 doses as needed for chest pain.   omeprazole 40 MG capsule Commonly known as:  PRILOSEC Take 40 mg by mouth daily.   ticagrelor 90 MG Tabs tablet Commonly known as:  BRILINTA Take 1 tablet (90 mg total) by mouth 2 (two) times daily.   Vitamin D (Ergocalciferol)  50000 units Caps capsule Commonly known as:  DRISDOL Take 50,000 Units by mouth every 7 (seven) days.       Followup plans and appointments: Call Dr. Thurman Coyer office for an appointment in 1 week.  Time spent with patient to include physician time: 30 minutes Signed: W. Doristine Church. MD  [facility] 10/07/2017, 9:15 AM

## 2017-10-07 NOTE — Care Management Note (Addendum)
Case Management Note  Patient Details  Name: ALMENDRA LORIA MRN: [id number] Date of Birth: 07-24-1945  Subjective/Objective:   From home, s/p coronary stent intervention, will be on brilinta, RN will give patient the 30 day free savings coupon, NCM spoke with patient informed patient about co pay of 37 .00 for refills and that Cardiologist has samples in office.  If patient has any questions , can call phone number on back of card. She will be going to [facility] on [address] to get brilinta , they do have it in stock.                  Action/Plan: DC home when ready.  Expected Discharge Date:                  Expected Discharge Plan:  Home/Self Care  In-House Referral:     Discharge planning Services  CM Consult, Medication Assistance  Post Acute Care Choice:    Choice offered to:     DME Arranged:    DME Agency:     HH Arranged:    HH Agency:     Status of Service:  Completed, signed off  If discussed at [employer] of Stay Meetings, dates discussed:    Additional Comments:  Zenon Mayo, RN 10/07/2017, 8:57 AM

## 2017-10-07 NOTE — Progress Notes (Signed)
CARDIAC REHAB PHASE I   PRE:  Rate/Rhythm: 65 SR    BP: sitting 102/49    SaO2:   MODE:  Ambulation: 450 ft   POST:  Rate/Rhythm: 96 SR    BP: sitting 146/63     SaO2:   Denied CP but c/o knees and back hurting due to arthritis. Also SOB with distance, probably due to smoking history. Ed completed. She is wanting to quit smoking and liked the fake cigarette. We discussed methods, she has quit before for 4 years. She understands the importance of Brilinta. Will refer to [place].  [phone]   [place], ACSM 10/07/2017 9:05 AM

## 2017-10-07 NOTE — Progress Notes (Signed)
Spoke to John/[employer] Medicare this is a tier 3 drug, No yearly deductable, no prior auth required copay is $37.00 if network pharmacy is used Paediatric nurse, [employer]

## 2017-10-08 ENCOUNTER — Telehealth (HOSPITAL_COMMUNITY): Payer: Self-pay

## 2017-10-08 NOTE — Telephone Encounter (Signed)
Attempted to make initial call to patient in regards to cardiac rehab - lm on vm

## 2017-10-08 NOTE — Telephone Encounter (Signed)
Patients insurance is active and benefits verified through [employer] - No co-pay, no deductible, out of pocket amount of $5,500/$129.62 has been met, 20% co-insurance, and no pre-authorization is required. Reference - Kathryn Bruce 10/08/17.  Will contact patient to see if she is interested in the Cardiac Rehab Program. If interested, patient will need to complete follow up appt. Once completed, patient will be contacted for scheduling.

## 2017-10-09 DIAGNOSIS — N390 Urinary tract infection, site not specified: Secondary | ICD-10-CM | POA: Diagnosis not present

## 2017-10-09 DIAGNOSIS — E559 Vitamin D deficiency, unspecified: Secondary | ICD-10-CM | POA: Diagnosis not present

## 2017-10-16 ENCOUNTER — Encounter: Payer: Self-pay | Admitting: Cardiology

## 2017-10-16 DIAGNOSIS — E785 Hyperlipidemia, unspecified: Secondary | ICD-10-CM | POA: Diagnosis not present

## 2017-10-16 DIAGNOSIS — I2510 Atherosclerotic heart disease of native coronary artery without angina pectoris: Secondary | ICD-10-CM | POA: Diagnosis not present

## 2017-10-16 DIAGNOSIS — I700 Atherosclerosis of aorta: Secondary | ICD-10-CM | POA: Diagnosis not present

## 2017-10-16 DIAGNOSIS — I10 Essential (primary) hypertension: Secondary | ICD-10-CM | POA: Diagnosis not present

## 2017-10-23 ENCOUNTER — Telehealth (HOSPITAL_COMMUNITY): Payer: Self-pay

## 2017-10-23 DIAGNOSIS — E119 Type 2 diabetes mellitus without complications: Secondary | ICD-10-CM | POA: Diagnosis not present

## 2017-10-23 NOTE — Telephone Encounter (Signed)
Called patient in regards to Cardiac Rehab - Patient stated she would not be able to afford the co-insurance. Offered maintenance to patient and she stated she is going to try out the gym first and if she decides to do Maintenance she will call. Closed referral.

## 2017-12-31 DIAGNOSIS — R300 Dysuria: Secondary | ICD-10-CM | POA: Diagnosis not present

## 2018-01-14 DIAGNOSIS — E1165 Type 2 diabetes mellitus with hyperglycemia: Secondary | ICD-10-CM | POA: Diagnosis not present

## 2018-01-14 DIAGNOSIS — E785 Hyperlipidemia, unspecified: Secondary | ICD-10-CM | POA: Diagnosis not present

## 2018-01-14 DIAGNOSIS — Z0000 Encounter for general adult medical examination without abnormal findings: Secondary | ICD-10-CM | POA: Diagnosis not present

## 2018-01-14 DIAGNOSIS — I10 Essential (primary) hypertension: Secondary | ICD-10-CM | POA: Diagnosis not present

## 2018-01-14 DIAGNOSIS — I2510 Atherosclerotic heart disease of native coronary artery without angina pectoris: Secondary | ICD-10-CM | POA: Diagnosis not present

## 2018-01-15 ENCOUNTER — Other Ambulatory Visit: Payer: Self-pay | Admitting: Family Medicine

## 2018-01-15 ENCOUNTER — Ambulatory Visit
Admission: RE | Admit: 2018-01-15 | Discharge: 2018-01-15 | Disposition: A | Discharge status: Home / Self Care | Payer: Medicare Other | Source: Ambulatory Visit | Attending: Family Medicine | Admitting: Family Medicine

## 2018-01-15 DIAGNOSIS — J189 Pneumonia, unspecified organism: Secondary | ICD-10-CM

## 2018-01-15 DIAGNOSIS — Z0389 Encounter for observation for other suspected diseases and conditions ruled out: Secondary | ICD-10-CM | POA: Diagnosis not present

## 2018-01-25 DIAGNOSIS — M545 Low back pain: Secondary | ICD-10-CM | POA: Diagnosis not present

## 2018-01-25 NOTE — Progress Notes (Signed)
Cardiology Office Note:    Date:  01/26/2018   ID:  Kathryn Bruce, DOB 12/24/1945, MRN [id number]  PCP:  Kathryn Austin, MD  Cardiologist:  Kathryn More, MD    Referring MD: Kathryn Austin, MD    ASSESSMENT:    1. Coronary artery disease of native artery of native heart with stable angina pectoris ([place])   2. Essential hypertension   3. Pure hypercholesterolemia    PLAN:    In order of problems listed above:  1. Continue medical treatment with aspirin substitute clopidogrel for Brilinta which may be exacerbating shortness of breath continue beta-blocker and intensify lipid-lowering therapy adding Zetia to her statin.  At this time I do not think she will ischemia evaluation 2. Stable continue current treatment including ACE inhibitor diuretic 3. LDL greater than 100 add Zetia to atorvastatin goal less than 70   Next appointment: 6 months   Medication Adjustments/Labs and Tests Ordered: Current medicines are reviewed at length with the patient today.  Concerns regarding medicines are outlined above.  No orders of the defined types were placed in this encounter.  No orders of the defined types were placed in this encounter.   Chief Complaint  Patient presents with  . Follow-up  . Coronary Artery Disease    History of Present Illness:    Kathryn Bruce is a 72 y.o. female with a hx of CAD and non STEMI 10/05/17 with PCI and stent of LAD EF 60% last seen yes. Compliance with diet, lifestyle and medications: yes but still smokes  She is not doing well her biggest problem is back pain side orthopedic surgeon prescribed muscle relaxant and I told her from my perspective is safe to take.  Her second problem is continued shortness of breath that she thinks is worse with Brilinta she is now 4 months removed from PCI we will discontinue and switch to clopidogrel I suspect is part of her deterioration but also suspect she has underlying COPD.  Next problem is fatigue.  She had one  episode of vague chest pain Saturday evening finally took a nitroglycerin relieved she has had no other episodes her EKG today sinus rhythm normal at this time I do not think she needs an ischemia evaluation.  Her LDL is above target greater than 100 and as opposed to using injectable PCSK9 we will place her on Zetia along with a statin check lipids in 1 month.  She will plan to be seen back in the office in 6 months or sooner if she is having typical angina. Past Medical History:  Diagnosis Date  . Aortic atherosclerosis ([place]) 10/05/2017  . Arthritis    "spine, knees" (10/06/2017)  . CAD (coronary artery disease), native coronary artery 10/05/2017   10/06/2016 cardiac catheterization showing 99% LAD stenosis, 20 to 30% RCA and circumflex stenosis, 3.5 x 15 mm Sierra stent in proximal LAD by Dr. Angelena Form  . Cancer ([place])    SKIN CANCDER REMOVED FROM NOSE   . Chronic lower back pain   . Diabetes mellitus type 2, noninsulin dependent ([place]) 10/05/2017  . GERD (gastroesophageal reflux disease)   . Hidradenitis   . Hypercholesteremia   . Hyperlipidemia 10/05/2017  . Hypertension   . Obesity (BMI 30-39.9) 10/05/2017  . Osteoarthritis (arthritis due to wear and tear of joints)    right knee  . Sleep apnea    STOP BANG SCORE 5  . Tobacco use 09/05/2011  . Type II diabetes mellitus ([place])  Past Surgical History:  Procedure Laterality Date  . CORONARY ANGIOPLASTY WITH STENT PLACEMENT  10/06/2017  . CORONARY STENT INTERVENTION N/A 10/06/2017   Procedure: CORONARY STENT INTERVENTION;  Surgeon: Burnell Blanks, MD;  Location: [facility];  Service: Cardiovascular;  Laterality: N/A;  . HERNIA REPAIR    . LEFT HEART CATH AND CORONARY ANGIOGRAPHY N/A 10/06/2017   Procedure: LEFT HEART CATH AND CORONARY ANGIOGRAPHY;  Surgeon: Burnell Blanks, MD;  Location: [facility];  Service: Cardiovascular;  Laterality: N/A;  . MULTIPLE TOOTH EXTRACTIONS  2017  . TONSILLECTOMY  1963  .  VENTRAL HERNIA REPAIR  09/04/2011   Procedure: LAPAROSCOPIC VENTRAL HERNIA;  Surgeon: Gayland Curry, MD,FACS;  Location: [facility];  Service: General;  Laterality: N/A;  LAPAROSCOPIC incarcerated VENTRAL HERNIA  repair     Current Medications: Current Meds  Medication Sig  . acetaminophen (TYLENOL) 500 MG tablet Take 500 mg by mouth as needed. pain  . alendronate (FOSAMAX) 70 MG tablet Take 70 mg by mouth every 7 (seven) days.  Marland Kitchen aspirin EC 81 MG EC tablet Take 1 tablet (81 mg total) by mouth daily.  Marland Kitchen atorvastatin (LIPITOR) 40 MG tablet Take 40 mg by mouth daily.  . CYMBALTA 30 MG capsule Take 60 mg by mouth daily.   Marland Kitchen glipiZIDE (GLUCOTROL XL) 5 MG 24 hr tablet Take 5 mg by mouth daily.  Marland Kitchen lisinopril-hydrochlorothiazide (PRINZIDE,ZESTORETIC) 20-25 MG per tablet Take 1 tablet by mouth daily.  . metFORMIN (GLUCOPHAGE) 500 MG tablet Take 1,000 mg by mouth 2 (two) times daily with a meal.  . metoprolol tartrate (LOPRESSOR) 25 MG tablet Take 1 tablet (25 mg total) by mouth 2 (two) times daily.  . nitroGLYCERIN (NITROSTAT) 0.4 MG SL tablet Place 1 tablet (0.4 mg total) under the tongue every 5 (five) minutes x 3 doses as needed for chest pain.  Marland Kitchen omeprazole (PRILOSEC) 40 MG capsule Take 40 mg by mouth daily.  . ticagrelor (BRILINTA) 90 MG TABS tablet Take 1 tablet (90 mg total) by mouth 2 (two) times daily.  . Vitamin D, Ergocalciferol, (DRISDOL) 50000 units CAPS capsule Take 50,000 Units by mouth every 7 (seven) days.     Allergies:   Patient has no known allergies.   Social History   Socioeconomic History  . Marital status: Widowed    Spouse name: Not on file  . Number of children: Not on file  . Years of education: Not on file  . Highest education level: Not on file  Occupational History  . Not on file  Social Needs  . Financial resource strain: Not on file  . Food insecurity:    Worry: Not on file    Inability: Not on file  . Transportation needs:    Medical: Not on file     Non-medical: Not on file  Tobacco Use  . Smoking status: Current Every Day Smoker    Packs/day: 0.50    Years: 42.00    Pack years: 21.00    Types: Cigarettes  . Smokeless tobacco: Never Used  Substance and Sexual Activity  . Alcohol use: No  . Drug use: No  . Sexual activity: Not on file  Lifestyle  . Physical activity:    Days per week: Not on file    Minutes per session: Not on file  . Stress: Not on file  Relationships  . Social connections:    Talks on phone: Not on file    Gets together: Not on file  Attends religious service: Not on file    Active member of club or organization: Not on file    Attends meetings of clubs or organizations: Not on file    Relationship status: Not on file  Other Topics Concern  . Not on file  Social History Narrative  . Not on file     Family History: The patient's family history includes Atrial fibrillation in her sister and sister; CAD (age of onset: 40) in her father; Cancer in her maternal grandfather; Cancer (age of onset: 40) in her sister; Dementia (age of onset: 20) in her mother; Lung cancer (age of onset: 58) in her brother; Premature CHD in her son. ROS:   Please see the history of present illness.    All other systems reviewed and are negative.  EKGs/Labs/Other Studies Reviewed:    The following studies were reviewed today:  EKG:  EKG ordered today.  The ekg ordered today demonstrates sinus rhythm normal EKG  Recent Labs: 10/05/2017: ALT 11; TSH 2.677 10/07/2017: BUN 13; Creatinine, Ser 0.66; Hemoglobin 12.2; Platelets 309; Potassium 3.5; Sodium 140  Recent Lipid Panel    Component Value Date/Time   CHOL 212 (H) 10/05/2017 1634   TRIG 350 (H) 10/05/2017 1634   HDL 51 10/05/2017 1634   CHOLHDL 4.2 10/05/2017 1634   VLDL 70 (H) 10/05/2017 1634   LDLCALC 91 10/05/2017 1634    Physical Exam:    VS:  BP (!) 142/82 (BP Location: Right Arm, Patient Position: Sitting, Cuff Size: Large)   Pulse (!) 57   Ht 5\' 4"   (1.626 m)   Wt 218 lb 12.8 oz (99.2 kg)   SpO2 98%   BMI 37.56 kg/m     Wt Readings from Last 3 Encounters:  01/26/18 218 lb 12.8 oz (99.2 kg)  10/07/17 216 lb 14.9 oz (98.4 kg)  11/06/11 218 lb 6.4 oz (99.1 kg)     GEN:  Well nourished, well developed in no acute distress HEENT: Normal NECK: No JVD; No carotid bruits LYMPHATICS: No lymphadenopathy CARDIAC: RRR, no murmurs, rubs, gallops RESPIRATORY:  Clear to auscultation without rales, wheezing or rhonchi  ABDOMEN: Soft, non-tender, non-distended MUSCULOSKELETAL:  No edema; No deformity  SKIN: Warm and dry NEUROLOGIC:  Alert and oriented x 3 PSYCHIATRIC:  Normal affect    Signed, Kathryn More, MD  01/26/2018 12:03 PM    [place]

## 2018-01-26 ENCOUNTER — Encounter: Payer: Self-pay | Admitting: Cardiology

## 2018-01-26 ENCOUNTER — Ambulatory Visit: Payer: Medicare Other | Admitting: Cardiology

## 2018-01-26 VITALS — BP 142/82 | HR 57 | Ht 64.0 in | Wt 218.8 lb

## 2018-01-26 DIAGNOSIS — I10 Essential (primary) hypertension: Secondary | ICD-10-CM

## 2018-01-26 DIAGNOSIS — E7800 Pure hypercholesterolemia, unspecified: Secondary | ICD-10-CM | POA: Diagnosis not present

## 2018-01-26 DIAGNOSIS — E119 Type 2 diabetes mellitus without complications: Secondary | ICD-10-CM

## 2018-01-26 DIAGNOSIS — I25118 Atherosclerotic heart disease of native coronary artery with other forms of angina pectoris: Secondary | ICD-10-CM | POA: Diagnosis not present

## 2018-01-26 MED ORDER — EZETIMIBE 10 MG PO TABS: 10.0000 mg | ORAL_TABLET | Freq: Every day | ORAL | 6 refills | Status: DC

## 2018-01-26 MED ORDER — CLOPIDOGREL BISULFATE 75 MG PO TABS: ORAL_TABLET | ORAL | 0 refills | Status: DC

## 2018-01-26 NOTE — Addendum Note (Signed)
Addended by: Austin Miles on: 01/26/2018 12:23 PM   Modules accepted: Orders

## 2018-01-26 NOTE — Patient Instructions (Addendum)
Medication Instructions:  Your physician has recommended you make the following change in your medication:   STOP brilinta  START clopidogrel (plavix) 75 mg: Take 2 tablets (150 mg) daily for the first two days then take 1 tablet (75 mg) daily START ezetimibe (zetia) 10 mg: Take 1 tablet daily   If you need a refill on your cardiac medications before your next appointment, please call your pharmacy.   Lab work: Your physician recommends that you return for lab work today: BMP.   Your physician recommends that you return for lab work in 1 month: Lipid panel. Please return to our office for lab work, no appointment needed. Make sure to fast beforehand.   If you have labs (blood work) drawn today and your tests are completely normal, you will receive your results only by: Marland Kitchen MyChart Message (if you have MyChart) OR . A paper copy in the mail If you have any lab test that is abnormal or we need to change your treatment, we will call you to review the results.  Testing/Procedures: You had an EKG today.   Follow-Up: At [facility], you and your health needs are our priority.  As part of our continuing mission to provide you with exceptional heart care, we have created designated Provider Care Teams.  These Care Teams include your primary Cardiologist (physician) and Advanced Practice Providers (APPs -  Physician Assistants and Nurse Practitioners) who all work together to provide you with the care you need, when you need it. You will need a follow up appointment in 6 months.  Please call our office 2 months in advance to schedule this appointment.      Clopidogrel tablets What is this medicine? CLOPIDOGREL (kloh PID oh grel) helps to prevent blood clots. This medicine is used to prevent heart attack, stroke, or other vascular events in people who are at high risk. This medicine may be used for other purposes; ask your health care provider or pharmacist if you have questions. COMMON BRAND  NAME(S): Plavix What should I tell my health care provider before I take this medicine? They need to know if you have any of the following conditions: -bleeding disorders -bleeding in the brain -having surgery -history of stomach bleeding -an unusual or allergic reaction to clopidogrel, other medicines, foods, dyes, or preservatives -pregnant or trying to get pregnant -breast-feeding How should I use this medicine? Take this medicine by mouth with a glass of water. Follow the directions on the prescription label. You may take this medicine with or without food. If it upsets your stomach, take it with food. Take your medicine at regular intervals. Do not take it more often than directed. Do not stop taking except on your doctor's advice. A special MedGuide will be given to you by the pharmacist with each prescription and refill. Be sure to read this information carefully each time. Talk to your pediatrician regarding the use of this medicine in children. Special care may be needed. Overdosage: If you think you have taken too much of this medicine contact a poison control center or emergency room at once. NOTE: This medicine is only for you. Do not share this medicine with others. What if I miss a dose? If you miss a dose, take it as soon as you can. If it is almost time for your next dose, take only that dose. Do not take double or extra doses. What may interact with this medicine? Do not take this medicine with the following medications: -dasabuvir;  ombitasvir; paritaprevir; ritonavir -defibrotide This medicine may also interact with the following medications: -antiviral medicines for HIV or AIDS -aspirin -certain medicines for depression like citalopram, fluoxetine, fluvoxamine -certain medicines for fungal infections like ketoconazole, fluconazole, voriconazole -certain medicines for seizures like felbamate, oxcarbazepine, phenytoin -certain medicines for stomach problems like  cimetidine, omeprazole, esomeprazole -certain medicines that treat or prevent blood clots like warfarin, enoxaparin, dalteparin, apixaban, dabigatran, rivaroxaban, ticlopidine -chloramphenicol -cilostazol -fluvastatin -isoniazid -modafinil -nicardipine -NSAIDS, medicines for pain and inflammation, like ibuprofen or naproxen -quinine -repaglinide -tamoxifen -tolbutamide -topiramate -torsemide This list may not describe all possible interactions. Give your health care provider a list of all the medicines, herbs, non-prescription drugs, or dietary supplements you use. Also tell them if you smoke, drink alcohol, or use illegal drugs. Some items may interact with your medicine. What should I watch for while using this medicine? Visit your doctor or health care professional for regular check ups. Do not stop taking your medicine unless your doctor tells you to. Notify your doctor or health care professional and seek emergency treatment if you develop breathing problems; changes in vision; chest pain; severe, sudden headache; pain, swelling, warmth in the leg; trouble speaking; sudden numbness or weakness of the face, arm or leg. These can be signs that your condition has gotten worse. If you are going to have surgery or dental work, tell your doctor or health care professional that you are taking this medicine. Certain genetic factors may reduce the effect of this medicine. Your doctor may use genetic tests to determine treatment. What side effects may I notice from receiving this medicine? Side effects that you should report to your doctor or health care professional as soon as possible: -allergic reactions like skin rash, itching or hives, swelling of the face, lips, or tongue -signs and symptoms of bleeding such as bloody or black, tarry stools; red or dark-brown urine; spitting up blood or brown material that looks like coffee grounds; red spots on the skin; unusual bruising or bleeding from  the eye, gums, or nose -signs and symptoms of a blood clot such as breathing problems; changes in vision; chest pain; severe, sudden headache; pain, swelling, warmth in the leg; trouble speaking; sudden numbness or weakness of the face, arm or leg Side effects that usually do not require medical attention (report to your doctor or health care professional if they continue or are bothersome): -constipation -diarrhea -headache -upset stomach This list may not describe all possible side effects. Call your doctor for medical advice about side effects. You may report side effects to FDA at 1-800-FDA-1088. Where should I keep my medicine? Keep out of the reach of children. Store at room temperature of 59 to 86 degrees F (15 to 30 degrees C). Throw away any unused medicine after the expiration date. NOTE: This sheet is a summary. It may not cover all possible information. If you have questions about this medicine, talk to your doctor, pharmacist, or health care provider.  2018 Elsevier/Gold Standard (2014-11-30 10:00:44)    Ezetimibe Tablets What is this medicine? EZETIMIBE (ez ET i mibe) blocks the absorption of cholesterol from the stomach. It can help lower blood cholesterol for patients who are at risk of getting heart disease or a stroke. It is only for patients whose cholesterol level is not controlled by diet. This medicine may be used for other purposes; ask your health care provider or pharmacist if you have questions. COMMON BRAND NAME(S): Zetia What should I tell my health care provider before  I take this medicine? They need to know if you have any of these conditions: -liver disease -an unusual or allergic reaction to ezetimibe, medicines, foods, dyes, or preservatives -pregnant or trying to get pregnant -breast-feeding How should I use this medicine? Take this medicine by mouth with a glass of water. Follow the directions on the prescription label. This medicine can be taken with  or without food. Take your doses at regular intervals. Do not take your medicine more often than directed. Talk to your pediatrician regarding the use of this medicine in children. Special care may be needed. Overdosage: If you think you have taken too much of this medicine contact a poison control center or emergency room at once. NOTE: This medicine is only for you. Do not share this medicine with others. What if I miss a dose? If you miss a dose, take it as soon as you can. If it is almost time for your next dose, take only that dose. Do not take double or extra doses. What may interact with this medicine? Do not take this medicine with any of the following medications: -fenofibrate -gemfibrozil This medicine may also interact with the following medications: -antacids -cyclosporine -herbal medicines like red yeast rice -other medicines to lower cholesterol or triglycerides This list may not describe all possible interactions. Give your health care provider a list of all the medicines, herbs, non-prescription drugs, or dietary supplements you use. Also tell them if you smoke, drink alcohol, or use illegal drugs. Some items may interact with your medicine. What should I watch for while using this medicine? Visit your doctor or health care professional for regular checks on your progress. You will need to have your cholesterol levels checked. If you are also taking some other cholesterol medicines, you will also need to have tests to make sure your liver is working properly. Tell your doctor or health care professional if you get any unexplained muscle pain, tenderness, or weakness, especially if you also have a fever and tiredness. You need to follow a low-cholesterol, low-fat diet while you are taking this medicine. This will decrease your risk of getting heart and blood vessel disease. Exercising and avoiding alcohol and smoking can also help. Ask your doctor or dietician for advice. What side  effects may I notice from receiving this medicine? Side effects that you should report to your doctor or health care professional as soon as possible: -allergic reactions like skin rash, itching or hives, swelling of the face, lips, or tongue -dark yellow or brown urine -unusually weak or tired -yellowing of the skin or eyes Side effects that usually do not require medical attention (report to your doctor or health care professional if they continue or are bothersome): -diarrhea -dizziness -headache -stomach upset or pain This list may not describe all possible side effects. Call your doctor for medical advice about side effects. You may report side effects to FDA at 1-800-FDA-1088. Where should I keep my medicine? Keep out of the reach of children. Store at room temperature between 15 and 30 degrees C (59 and 86 degrees F). Protect from moisture. Keep container tightly closed. Throw away any unused medicine after the expiration date. NOTE: This sheet is a summary. It may not cover all possible information. If you have questions about this medicine, talk to your doctor, pharmacist, or health care provider.  2018 Elsevier/Gold Standard (2011-09-01 15:39:09)

## 2018-01-27 LAB — BASIC METABOLIC PANEL
BUN/Creatinine Ratio: 21 (ref 12–28)
BUN: 11 mg/dL (ref 8–27)
CO2: 26 mmol/L (ref 20–29)
Calcium: 9 mg/dL (ref 8.7–10.3)
Chloride: 100 mmol/L (ref 96–106)
Creatinine, Ser: 0.53 mg/dL — ABNORMAL LOW (ref 0.57–1.00)
GFR calc Af Amer: 110 mL/min/{1.73_m2} (ref >59)
GFR calc non Af Amer: 95 mL/min/{1.73_m2} (ref >59)
Glucose: 65 mg/dL (ref 65–99)
Potassium: 4 mmol/L (ref 3.5–5.2)
Sodium: 141 mmol/L (ref 134–144)

## 2018-01-29 DIAGNOSIS — E119 Type 2 diabetes mellitus without complications: Secondary | ICD-10-CM | POA: Diagnosis not present

## 2018-03-01 ENCOUNTER — Telehealth: Payer: Self-pay | Admitting: Cardiology

## 2018-04-14 DIAGNOSIS — E559 Vitamin D deficiency, unspecified: Secondary | ICD-10-CM | POA: Diagnosis not present

## 2018-04-20 DIAGNOSIS — I10 Essential (primary) hypertension: Secondary | ICD-10-CM | POA: Diagnosis not present

## 2018-04-20 DIAGNOSIS — I25119 Atherosclerotic heart disease of native coronary artery with unspecified angina pectoris: Secondary | ICD-10-CM | POA: Diagnosis not present

## 2018-04-20 DIAGNOSIS — M79602 Pain in left arm: Secondary | ICD-10-CM | POA: Diagnosis not present

## 2018-05-03 ENCOUNTER — Other Ambulatory Visit: Payer: Self-pay

## 2018-05-03 MED ORDER — CLOPIDOGREL BISULFATE 75 MG PO TABS: ORAL_TABLET | ORAL | 2 refills | Status: DC

## 2018-05-10 NOTE — Telephone Encounter (Signed)
error 

## 2018-05-17 IMAGING — CT CT CHEST LCS NODULE FOLLOW-UP W/O CM
2 of 4 series · 8 of 40 positions shown, 10 images · non-contrast
Comparison: Low-dose lung cancer screening CT chest dated
01/03/2016

CLINICAL DATA: 70-year-old female current smoker, with 40 pack-year
history of smoking, for short-term follow-up lung cancer screening

EXAM:
CT CHEST WITHOUT CONTRAST FOR LUNG CANCER SCREENING NODULE FOLLOW-UP
TECHNIQUE: Multidetector CT imaging of the chest was performed following the
standard protocol without IV contrast.

[Series 4: coronal · coronal · 0.61mm/px · 3 of 250 slices shown]
[im 50/250  lung]
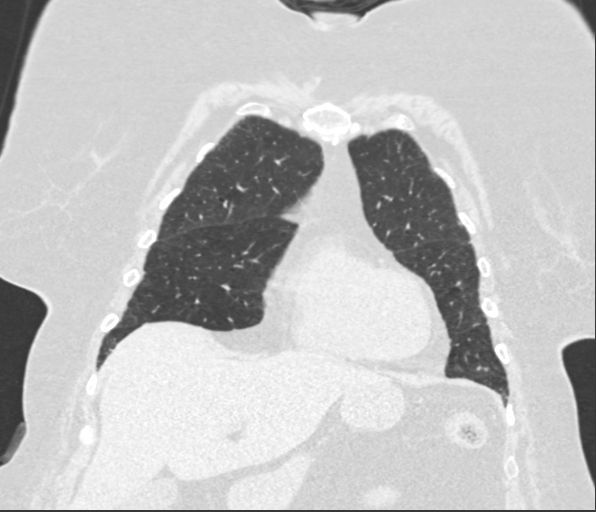
[im 100/250  lung]
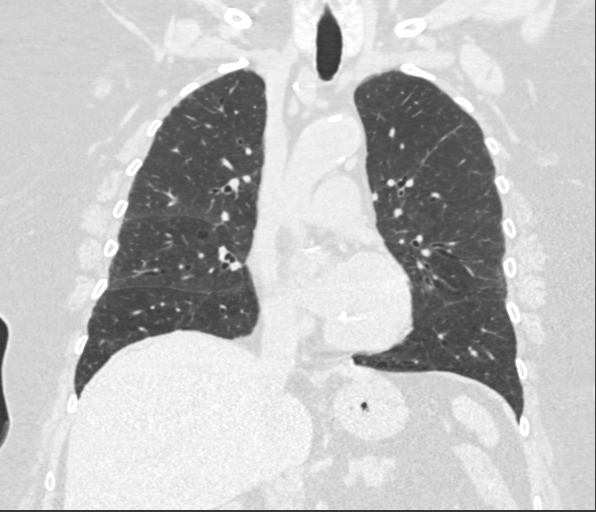
[im 150/250  lung]
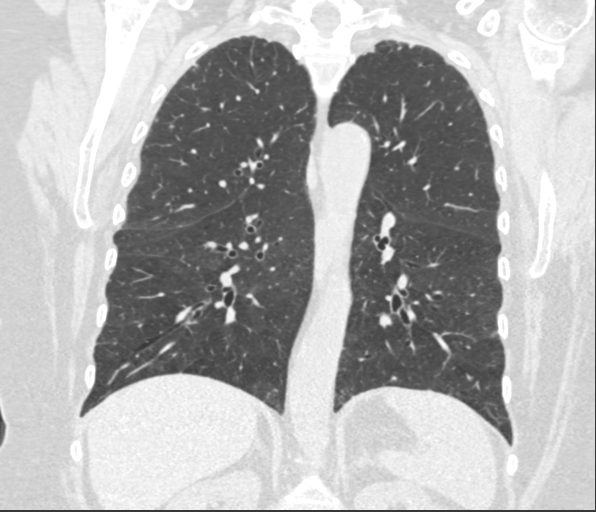

[ct lung segmentation data · axial · 0.72mm/px · z∈[-363,-363]mm · 5 of 308 frames shown]
[frame 1/308  mediastinal]
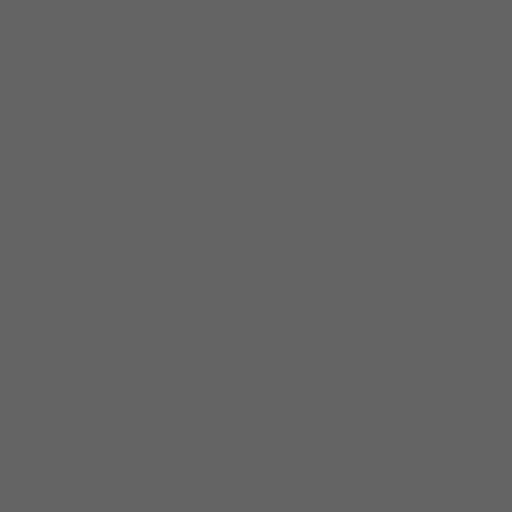
[frame 1/308  lung]
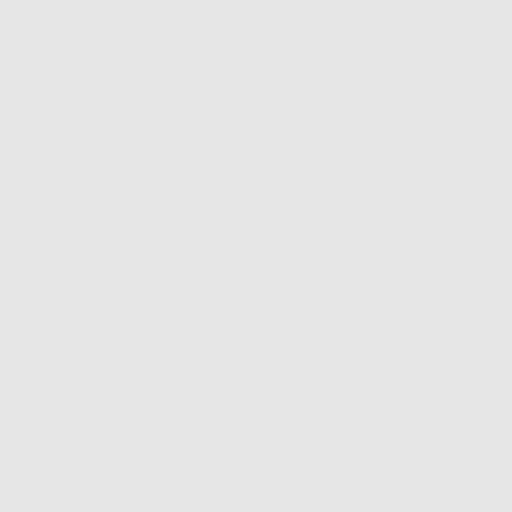
[frame 35/308  lung]
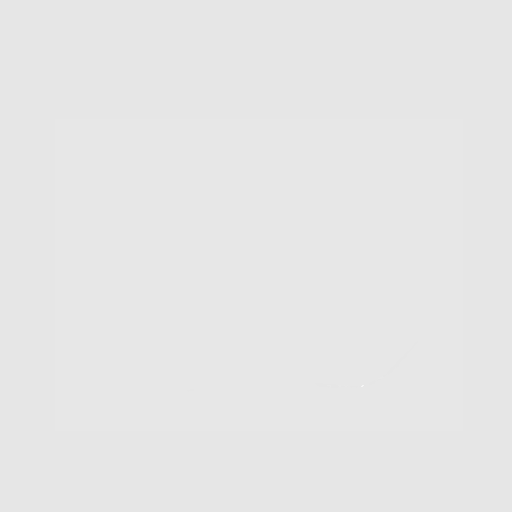
[frame 69/308  lung]
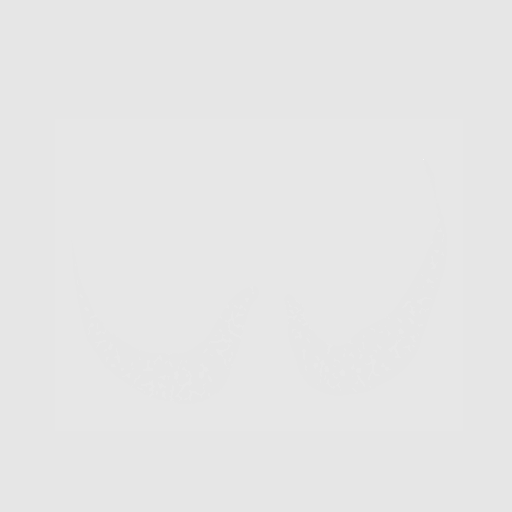
[frame 103/308  lung]
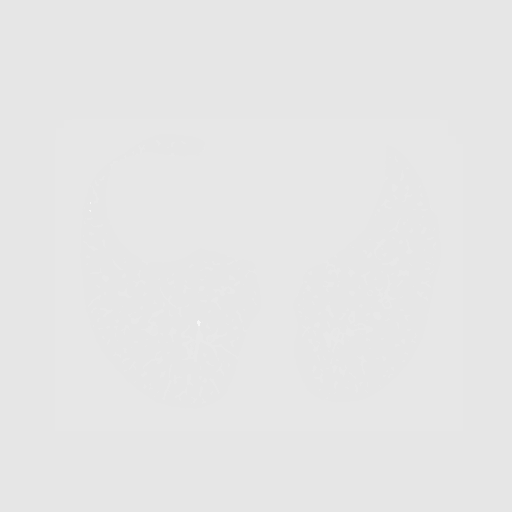
[frame 137/308  mediastinal]
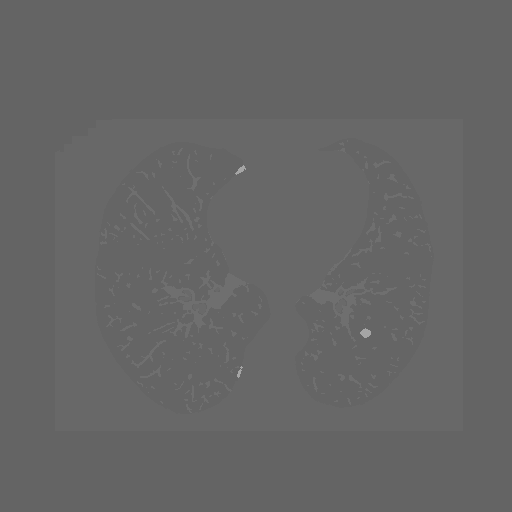
[frame 137/308  lung]
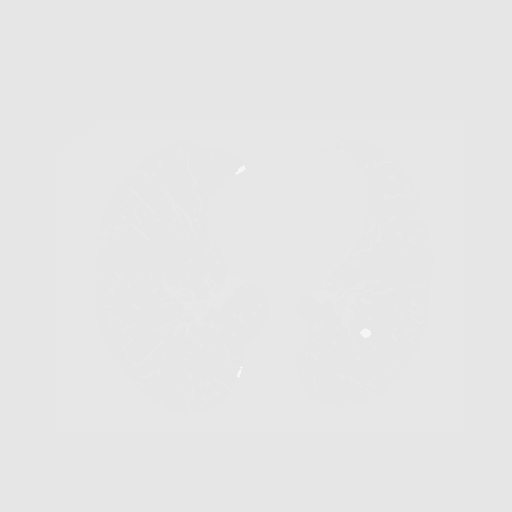

[8 of 40 positions shown; findings below may reference images not displayed]

FINDINGS: Cardiovascular: The heart is normal in size. No pericardial
effusion.

Coronary atherosclerosis the LAD and right coronary artery.

No evidence of thoracic aortic aneurysm. Atherosclerotic
calcifications of the aortic arch.

Mediastinum/Nodes: Small mediastinal lymph nodes, including a
dominant 10 mm short axis left paratracheal/AP window node (series
2/ image 21), within normal limits.

Visualized left thyroid is mildly heterogeneous/ nodular.

Lungs/Pleura: 8.1 mm central left lower lobe pulmonary nodule
(series 3/ image 180), previously 7.6 mm. This measured 4 mm on
prior CT abdomen/pelvis on 07/15/2011.

Additional scattered small bilateral pulmonary nodules, many of
which are subpleural. Most of these are unchanged, while a 3.7 mm
subpleural nodule in the right lower lobe is new (series 3/image
167).

Biapical pleural-parenchymal scarring.

No focal consolidation.

No pleural effusion or pneumothorax.

Upper Abdomen: Visualized upper abdomen is notable for bilateral
adrenal adenomas, benign.

Musculoskeletal: Mild degenerative changes of the visualized
thoracolumbar spine.
IMPRESSION: Lung-RADS Category 2, benign appearance or behavior. Continue annual
screening with low-dose chest CT without contrast in 12 months.

Dominant 8.1 mm left lower lobe nodule has only mildly progressed
from 3854. This indolent growth pattern favors a benign etiology
such as a hamartoma.

## 2018-05-18 DIAGNOSIS — Z1231 Encounter for screening mammogram for malignant neoplasm of breast: Secondary | ICD-10-CM | POA: Diagnosis not present

## 2018-05-19 DIAGNOSIS — L820 Inflamed seborrheic keratosis: Secondary | ICD-10-CM | POA: Diagnosis not present

## 2018-05-19 DIAGNOSIS — L570 Actinic keratosis: Secondary | ICD-10-CM | POA: Diagnosis not present

## 2018-05-19 DIAGNOSIS — Z85828 Personal history of other malignant neoplasm of skin: Secondary | ICD-10-CM | POA: Diagnosis not present

## 2018-05-19 DIAGNOSIS — D692 Other nonthrombocytopenic purpura: Secondary | ICD-10-CM | POA: Diagnosis not present

## 2018-06-03 ENCOUNTER — Ambulatory Visit: Payer: Medicare Other | Admitting: Cardiology

## 2018-08-27 ENCOUNTER — Telehealth: Payer: Self-pay

## 2018-08-27 NOTE — Telephone Encounter (Signed)
    COVID-19 Pre-Screening Questions:  . In the past 7 to 10 days have you had a cough,  shortness of breath, headache, congestion, fever (100 or greater) body aches, chills, sore throat, or sudden loss of taste or sense of smell? Cough-Pt state she smoke and cough isn't new . Have you been around anyone with known Covid 19. No . Have you been around anyone who is awaiting Covid 19 test results in the past 7 to 10 days? No . Have you been around anyone who has been exposed to Covid 19, or has mentioned symptoms of Covid 19 within the past 7 to 10 days? No  If you have any concerns/questions about symptoms patients report during screening (either on the phone or at threshold). Contact the provider seeing the patient or DOD for further guidance.  If neither are available contact a member of the leadership team.    Pt made aware to wear a mask and no visitors. Pt voiced understanding.

## 2018-08-30 ENCOUNTER — Ambulatory Visit: Payer: Medicare Other | Admitting: Cardiology

## 2018-08-31 ENCOUNTER — Other Ambulatory Visit: Payer: Self-pay | Admitting: *Deleted

## 2018-08-31 DIAGNOSIS — Z122 Encounter for screening for malignant neoplasm of respiratory organs: Secondary | ICD-10-CM

## 2018-08-31 DIAGNOSIS — Z87891 Personal history of nicotine dependence: Secondary | ICD-10-CM

## 2018-08-31 DIAGNOSIS — F17210 Nicotine dependence, cigarettes, uncomplicated: Secondary | ICD-10-CM

## 2018-09-01 ENCOUNTER — Telehealth: Payer: Self-pay | Admitting: Cardiology

## 2018-09-01 NOTE — Telephone Encounter (Signed)
I called patient to confirm her appointment  for 09-03-18 with Dr Osvaldo Shipper.

## 2018-09-03 ENCOUNTER — Encounter: Payer: Self-pay | Admitting: Cardiology

## 2018-09-03 ENCOUNTER — Other Ambulatory Visit: Payer: Self-pay

## 2018-09-03 ENCOUNTER — Ambulatory Visit (INDEPENDENT_AMBULATORY_CARE_PROVIDER_SITE_OTHER): Payer: Medicare Other | Admitting: Cardiology

## 2018-09-03 VITALS — BP 140/86 | HR 58 | Temp 98.2°F | Ht 65.0 in | Wt 215.0 lb

## 2018-09-03 DIAGNOSIS — Z7189 Other specified counseling: Secondary | ICD-10-CM

## 2018-09-03 DIAGNOSIS — E782 Mixed hyperlipidemia: Secondary | ICD-10-CM

## 2018-09-03 DIAGNOSIS — Z720 Tobacco use: Secondary | ICD-10-CM

## 2018-09-03 DIAGNOSIS — Z79899 Other long term (current) drug therapy: Secondary | ICD-10-CM | POA: Diagnosis not present

## 2018-09-03 DIAGNOSIS — I10 Essential (primary) hypertension: Secondary | ICD-10-CM | POA: Diagnosis not present

## 2018-09-03 DIAGNOSIS — I2510 Atherosclerotic heart disease of native coronary artery without angina pectoris: Secondary | ICD-10-CM

## 2018-09-03 MED ORDER — CARVEDILOL 12.5 MG PO TABS: 12.5000 mg | ORAL_TABLET | Freq: Two times a day (BID) | ORAL | 0 refills | Status: DC

## 2018-09-03 NOTE — Patient Instructions (Signed)
Medication Instructions:  Stop: Metoprolol 25 mg Start: Carvedilol 12.5 mg two times a day  If you need a refill on your cardiac medications before your next appointment, please call your pharmacy.   Lab work: Your physician recommends that you return for lab work today (BMP, Lipid)  If you have labs (blood work) drawn today and your tests are completely normal, you will receive your results only by: Marland Kitchen MyChart Message (if you have MyChart) OR . A paper copy in the mail If you have any lab test that is abnormal or we need to change your treatment, we will call you to review the results.  Testing/Procedures: None  Follow-Up: At [facility], you and your health needs are our priority.  As part of our continuing mission to provide you with exceptional heart care, we have created designated Provider Care Teams.  These Care Teams include your primary Cardiologist (physician) and Advanced Practice Providers (APPs -  Physician Assistants and Nurse Practitioners) who all work together to provide you with the care you need, when you need it. You will need a follow up appointment in 3 months.  Please call our office 2 months in advance to schedule this appointment.  You may see Buford Dresser, MD or one of the following Advanced Practice Providers on your designated Care Team:   Rosaria Ferries, PA-C . Jory Sims, DNP, ANP

## 2018-09-03 NOTE — Progress Notes (Signed)
Cardiology Office Note:    Date:  09/03/2018   ID:  Kathryn Bruce, Kathryn Bruce June 29, 1945, MRN [id number]  PCP:  Kathryn Austin, MD (Inactive)  Cardiologist:  Kathryn Dresser, MD PhD (prior Dr. Bettina Bruce and Dr. Wynonia Bruce)  Referring MD: Kathryn Jordan, MD   CC: establish care  History of Present Illness:    Kathryn Bruce is a 73 y.o. female with a hx of CAD, NSTEMI 09/2017 s/p PCI, type II diabetes, hypertension, hyperlipidemia, obesity who is seen as a new patient to me (prior Dr. Georgiann Bruce. Kathryn Bruce) at the request of Kathryn Jordan, MD for the evaluation and management of CAD.  Cardiac History: History of CAD and NSTEMI 10/05/2017 s/p PCI to LAD (Dr Kathryn Bruce) Risk factors: tobacco use, type II diabetes, hypertension, hyperlipidemia, obesity Symptoms: shortness of breath, changed from brilinta to clopidogrel by Dr. Bettina Bruce. Since then SOB resolved.  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  Today: Patient concerns: no specific concerns today. Was having pains in her left arm a few months ago, went to Kathryn Bruce' office and had EKG, told it was fine.  Not having pain like she was. Was helped by nitro, but hasn't need any more than 1 in the last 2 mos. Now rarely with milder left arm pain. Her prior MI symptoms in 2019 were pain in the right arm.  Her main general issue is back pain, which is being monitored by her PCP.  Does snore. Never had a sleep study, but recommended to do so. She will consider this. Tired during the day, falls asleep easily. No morning headaches.   Thinks she has peripheral neuropathy. Feels like she walks on blisters, shooting pain. Does endorse worse with elevation/better with hanging down. No nonhealing wounds. Does sometimes have redness in her feet with tingling. No claudication symptoms.  Discussed risk factors. Still smoking, has tried to quit many times but goes back to it. Longest length of time 4 years. Currently smoking 0.5-1 ppd. Nicotine gum  worked the best but has had dental surgery since and can't use anymore. Tried chantix.  Diabetes: last A1c 6.9. On metformin and glipizide.Discussed jardiance at length, not clear that is would be covered by her insurance. If able, I would like to get her on this given her comorbities.  Hypertension: reports that home numbers are 130-140, never over 150. No low HR that she is aware of. Discussed change from metoprolol to carvedilol, she is amenable. Hasn't taken potassium. Just refilled lisinopril-HCTZ.  HLD: not fasting, had coffee with cream, small piece of angel food cake and a piece of candy. Taking both atorvastatin and ezetimibe.  Denies chest pain, shortness of breath at rest or with normal exertion. No PND, orthopnea, LE edema or unexpected weight gain. No syncope or palpitations.  Past Medical History:  Diagnosis Date  . Aortic atherosclerosis ([place]) 10/05/2017  . Arthritis    "spine, knees" (10/06/2017)  . CAD (coronary artery disease), native coronary artery 10/05/2017   10/06/2016 cardiac catheterization showing 99% LAD stenosis, 20 to 30% RCA and circumflex stenosis, 3.5 x 15 mm Sierra stent in proximal LAD by Dr. Angelena Bruce  . Cancer ([place])    SKIN CANCDER REMOVED FROM NOSE   . Chronic lower back pain   . Diabetes mellitus type 2, noninsulin dependent ([place]) 10/05/2017  . GERD (gastroesophageal reflux disease)   . Hidradenitis   . Hypercholesteremia   . Hyperlipidemia 10/05/2017  . Hypertension   . Obesity (BMI 30-39.9) 10/05/2017  .  Osteoarthritis (arthritis due to wear and tear of joints)    right knee  . Sleep apnea    STOP BANG SCORE 5  . Tobacco use 09/05/2011  . Type II diabetes mellitus ([place])     Past Surgical History:  Procedure Laterality Date  . CORONARY ANGIOPLASTY WITH STENT PLACEMENT  10/06/2017  . CORONARY STENT INTERVENTION N/A 10/06/2017   Procedure: CORONARY STENT INTERVENTION;  Surgeon: Kathryn Blanks, MD;  Location: [facility];  Service:  Cardiovascular;  Laterality: N/A;  . HERNIA REPAIR    . LEFT HEART CATH AND CORONARY ANGIOGRAPHY N/A 10/06/2017   Procedure: LEFT HEART CATH AND CORONARY ANGIOGRAPHY;  Surgeon: Kathryn Blanks, MD;  Location: [facility];  Service: Cardiovascular;  Laterality: N/A;  . MULTIPLE TOOTH EXTRACTIONS  2017  . TONSILLECTOMY  1963  . VENTRAL HERNIA REPAIR  09/04/2011   Procedure: LAPAROSCOPIC VENTRAL HERNIA;  Surgeon: Kathryn Curry, MD,FACS;  Location: [facility];  Service: General;  Laterality: N/A;  LAPAROSCOPIC incarcerated VENTRAL HERNIA  repair     Current Medications: Current Outpatient Medications on File Prior to Visit  Medication Sig  . acetaminophen (TYLENOL) 500 MG tablet Take 500 mg by mouth as needed. pain  . alendronate (FOSAMAX) 70 MG tablet Take 70 mg by mouth every 7 (seven) days.  Marland Kitchen aspirin EC 81 MG EC tablet Take 1 tablet (81 mg total) by mouth daily.  Marland Kitchen atorvastatin (LIPITOR) 40 MG tablet Take 40 mg by mouth daily.  . clopidogrel (PLAVIX) 75 MG tablet Take 2 tablets (150 mg) daily for the first 2 days then take 1 tablet (75 mg) daily.  . CYMBALTA 30 MG capsule Take 60 mg by mouth daily.   Marland Kitchen glipiZIDE (GLUCOTROL XL) 5 MG 24 hr tablet Take 5 mg by mouth daily.  Marland Kitchen lisinopril-hydrochlorothiazide (PRINZIDE,ZESTORETIC) 20-25 MG per tablet Take 1 tablet by mouth daily.  . metFORMIN (GLUCOPHAGE) 500 MG tablet Take 1,000 mg by mouth 2 (two) times daily with a meal.  . metoprolol tartrate (LOPRESSOR) 25 MG tablet Take 1 tablet (25 mg total) by mouth 2 (two) times daily.  . nitroGLYCERIN (NITROSTAT) 0.4 MG SL tablet Place 1 tablet (0.4 mg total) under the tongue every 5 (five) minutes x 3 doses as needed for chest pain.  Marland Kitchen omeprazole (PRILOSEC) 40 MG capsule Take 40 mg by mouth daily.  . Vitamin D, Ergocalciferol, (DRISDOL) 50000 units CAPS capsule Take 50,000 Units by mouth every 7 (seven) days.  Marland Kitchen ezetimibe (ZETIA) 10 MG tablet Take 1 tablet (10 mg total) by mouth daily.   No  current facility-administered medications on file prior to visit.      Allergies:   Patient has no known allergies.   Social History   Socioeconomic History  . Marital status: Widowed    Spouse name: Not on file  . Number of children: Not on file  . Years of education: Not on file  . Highest education level: Not on file  Occupational History  . Not on file  Social Needs  . Financial resource strain: Not on file  . Food insecurity    Worry: Not on file    Inability: Not on file  . Transportation needs    Medical: Not on file    Non-medical: Not on file  Tobacco Use  . Smoking status: Current Every Day Smoker    Packs/day: 0.50    Years: 42.00    Pack years: 21.00    Types: Cigarettes  .  Smokeless tobacco: Never Used  Substance and Sexual Activity  . Alcohol use: No  . Drug use: No  . Sexual activity: Not on file  Lifestyle  . Physical activity    Days per week: Not on file    Minutes per session: Not on file  . Stress: Not on file  Relationships  . Social Herbalist on phone: Not on file    Gets together: Not on file    Attends religious service: Not on file    Active member of club or organization: Not on file    Attends meetings of clubs or organizations: Not on file    Relationship status: Not on file  Other Topics Concern  . Not on file  Social History Narrative  . Not on file     Family History: The patient's family history includes Atrial fibrillation in her sister and sister; CAD (age of onset: 30) in her father; Cancer in her maternal grandfather; Cancer (age of onset: 58) in her sister; Dementia (age of onset: 82) in her mother; Lung cancer (age of onset: 3) in her brother; Premature CHD in her son.  ROS:   Please see the history of present illness.  Additional pertinent ROS:  Constitutional: Negative for chills, fever, night sweats, unintentional weight loss  HENT: Negative for ear pain and hearing loss.   Eyes: Negative for loss of  vision and eye pain.  Respiratory: Negative for cough, sputum, shortness of breath, wheezing.   Cardiovascular: See HPI. Gastrointestinal: Negative for abdominal pain, melena, and hematochezia.  Genitourinary: Negative for dysuria and hematuria.  Musculoskeletal: Negative for falls and myalgias.  Skin: Negative for itching and rash.  Neurological: Negative for focal weakness, focal sensory changes and loss of consciousness. Positive for foot pain. Endo/Heme/Allergies: Does not bruise/bleed easily.    EKGs/Labs/Other Studies Reviewed:    The following studies were reviewed today: Cath 10/06/2017  Prox RCA lesion is 30% stenosed.  Prox RCA to Dist RCA lesion is 10% stenosed.  Ost Cx lesion is 20% stenosed.  Prox Cx to Mid Cx lesion is 20% stenosed.  Ost 3rd Mrg lesion is 30% stenosed.  Prox LAD lesion is 99% stenosed.  A drug-eluting stent was successfully placed using a STENT SIERRA 3.50 X 15 MM.  Post intervention, there is a 0% residual stenosis.  The left ventricular systolic function is normal.  LV end diastolic pressure is normal.  The left ventricular ejection fraction is 55-65% by visual estimate.  There is no mitral valve regurgitation.   1. NSTEMI secondary to severe stenosis in the proximal LAD 2. Successful PTCA/DES x 1 proximal LAD 3. Mild non-obstructive disease in the RCA and Circumflex. 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  4. Normal LV systolic function.   Recommend uninterrupted dual antiplatelet therapy with Aspirin 81mg  daily and Ticagrelor 90mg  twice daily for a minimum of 12 months (ACS - Class I recommendation).  EKG:  EKG is personally reviewed.  The ekg ordered today demonstrates sinus bradycardia/sinus arrhythmia  Recent Labs: 10/05/2017: ALT 11; TSH 2.677 10/07/2017: Hemoglobin 12.2; Platelets 309 01/26/2018: BUN 11; Creatinine, Ser 0.53; Potassium 4.0; Sodium 141  Recent Lipid Panel    Component Value Date/Time   CHOL  212 (H) 10/05/2017 1634   TRIG 350 (H) 10/05/2017 1634   HDL 51 10/05/2017 1634   CHOLHDL 4.2 10/05/2017 1634   VLDL 70 (H) 10/05/2017 1634   LDLCALC 91 10/05/2017 1634    Physical Exam:  VS:  BP 140/86 (BP Location: Left Arm, Patient Position: Sitting, Cuff Size: Normal)   Pulse (!) 58   Temp 98.2 F (36.8 C)   Ht 5\' 5"  (1.651 m)   Wt 215 lb (97.5 kg)   BMI 35.78 kg/m     Wt Readings from Last 3 Encounters:  09/03/18 215 lb (97.5 kg)  01/26/18 218 lb 12.8 oz (99.2 kg)  10/07/17 216 lb 14.9 oz (98.4 kg)     GEN: Well nourished, well developed in no acute distress HEENT: Normal NECK: No JVD; No carotid bruits LYMPHATICS: No lymphadenopathy CARDIAC: regular rhythm, normal S1 and S2, no murmurs, rubs, gallops. Radial and DP pulses 2+ bilaterally. RESPIRATORY:  Clear to auscultation without rales, wheezing or rhonchi  ABDOMEN: Soft, non-tender, non-distended MUSCULOSKELETAL:  No edema; No deformity  SKIN: Warm and dry NEUROLOGIC:  Alert and oriented x 3 PSYCHIATRIC:  Normal affect   ASSESSMENT:    1. Essential hypertension   2. Coronary artery disease involving native coronary artery of native heart without angina pectoris   3. Tobacco use   4. Medication management   5. Mixed hyperlipidemia   6. Counseling on health promotion and disease prevention    PLAN:    Hypertension: reports that home numbers are 130-140, never over 150.  -above goal of 130/80 -will change metoprolol to carvedilol today -continue lisinopril-HCTZ. Check BMET today -given her snoring, discuss sleep study again at next visit  CAD with prior PCI: denies any recent symptoms -continue aspirin 81 mg -continue atorvastatin 40 mg -also on clopidogrel 75 mg daily. Had SOB on brilinta. Almost 12 mos since PCI, reassess at follow up  Tobacco abuse and counseling: The patient was counseled on tobacco cessation today for 4 minutes.  Counseling included reviewing the risks of smoking tobacco  products, how it impacts the patient's current medical diagnoses and different strategies for quitting.  Pharmacotherapy to aid in tobacco cessation was not prescribed today.  HLD: goal LDL <70 -Taking both atorvastatin and ezetimibe -last LDL 103, would like to repeat fasting as prior TG 229  Cardiac risk counseling and prevention recommendations: -recommend heart healthy/Mediterranean diet, with whole grains, fruits, vegetable, fish, lean meats, nuts, and olive oil. Limit salt. -recommend moderate walking, 3-5 times/week for 30-50 minutes each session. Aim for at least 150 minutes.week. Goal should be pace of 3 miles/hours, or walking 1.5 miles in 30 minutes -recommend avoidance of tobacco products. Avoid excess alcohol. -Additional risk factor control:  -Diabetes: A1c is 6.9. Would like her on jardiance given CAD, she is concerned about cost  -Lipids: as above  -Blood pressure control: as above  -Weight: BMI 35, would benefit from weigh loss  Plan for follow up: 3 mos  Medication Adjustments/Labs and Tests Ordered: Current medicines are reviewed at length with the patient today.  Concerns regarding medicines are outlined above.  Orders Placed This Encounter  Procedures  . Lipid panel  . Basic metabolic panel  . EKG 12-Lead   Meds ordered this encounter  Medications  . carvedilol (COREG) 12.5 MG tablet    Sig: Take 1 tablet (12.5 mg total) by mouth 2 (two) times daily with a meal.    Dispense:  180 tablet    Refill:  0    Patient Instructions  Medication Instructions:  Stop: Metoprolol 25 mg Start: Carvedilol 12.5 mg two times a day  If you need a refill on your cardiac medications before your next appointment, please call your pharmacy.   Lab work:  Your physician recommends that you return for lab work today (BMP, Lipid)  If you have labs (blood work) drawn today and your tests are completely normal, you will receive your results only by: Marland Kitchen MyChart Message (if you have  MyChart) OR . A paper copy in the mail If you have any lab test that is abnormal or we need to change your treatment, we will call you to review the results.  Testing/Procedures: None  Follow-Up: At [facility], you and your health needs are our priority.  As part of our continuing mission to provide you with exceptional heart care, we have created designated Provider Care Teams.  These Care Teams include your primary Cardiologist (physician) and Advanced Practice Providers (APPs -  Physician Assistants and Nurse Practitioners) who all work together to provide you with the care you need, when you need it. You will need a follow up appointment in 3 months.  Please call our office 2 months in advance to schedule this appointment.  You may see Kathryn Dresser, MD or one of the following Advanced Practice Providers on your designated Care Team:   Rosaria Ferries, PA-C . Jory Sims, DNP, ANP        Signed, Kathryn Dresser, MD PhD 09/03/2018  [place]

## 2018-09-04 LAB — LIPID PANEL
Chol/HDL Ratio: 3.3 ratio (ref 0.0–4.4)
Cholesterol, Total: 166 mg/dL (ref 100–199)
HDL: 51 mg/dL (ref >39)
LDL Calculated: 64 mg/dL (ref 0–99)
Triglycerides: 256 mg/dL — ABNORMAL HIGH (ref 0–149)
VLDL Cholesterol Cal: 51 mg/dL — ABNORMAL HIGH (ref 5–40)

## 2018-09-04 LAB — BASIC METABOLIC PANEL
BUN/Creatinine Ratio: 16 (ref 12–28)
BUN: 11 mg/dL (ref 8–27)
CO2: 21 mmol/L (ref 20–29)
Calcium: 9.3 mg/dL (ref 8.7–10.3)
Chloride: 98 mmol/L (ref 96–106)
Creatinine, Ser: 0.68 mg/dL (ref 0.57–1.00)
GFR calc Af Amer: 100 mL/min/{1.73_m2} (ref >59)
GFR calc non Af Amer: 87 mL/min/{1.73_m2} (ref >59)
Glucose: 79 mg/dL (ref 65–99)
Potassium: 4.2 mmol/L (ref 3.5–5.2)
Sodium: 139 mmol/L (ref 134–144)

## 2018-09-17 ENCOUNTER — Encounter: Payer: Self-pay | Admitting: Cardiology

## 2018-09-20 ENCOUNTER — Other Ambulatory Visit: Payer: Self-pay | Admitting: Cardiology

## 2018-09-20 MED ORDER — EZETIMIBE 10 MG PO TABS: 10.0000 mg | ORAL_TABLET | Freq: Every day | ORAL | 3 refills | Status: DC

## 2018-09-20 NOTE — Telephone Encounter (Signed)
°*  STAT* If patient is at the pharmacy, call can be transferred to refill team.   1. Which medications need to be refilled? (please list name of each medication and dose if known) ezetimibe (ZETIA) 10 MG tablet [[id number]]   2. Which pharmacy/location (including street and city if local pharmacy) is medication to be sent to?  [facility] [place], [place] [phone] (Phone) [phone] (Fax)    3. Do they need a 30 day or 90 day supply? 90 day

## 2018-09-20 NOTE — Telephone Encounter (Signed)
New Rx sent to requesting pharmacy.

## 2018-10-07 ENCOUNTER — Ambulatory Visit (INDEPENDENT_AMBULATORY_CARE_PROVIDER_SITE_OTHER)
Admission: RE | Admit: 2018-10-07 | Discharge: 2018-10-07 | Disposition: A | Discharge status: Home / Self Care | Payer: Medicare Other | Source: Ambulatory Visit | Attending: Acute Care | Admitting: Acute Care

## 2018-10-07 ENCOUNTER — Other Ambulatory Visit: Payer: Self-pay

## 2018-10-07 DIAGNOSIS — Z87891 Personal history of nicotine dependence: Secondary | ICD-10-CM | POA: Diagnosis not present

## 2018-10-07 DIAGNOSIS — F17210 Nicotine dependence, cigarettes, uncomplicated: Secondary | ICD-10-CM | POA: Diagnosis not present

## 2018-10-07 DIAGNOSIS — Z122 Encounter for screening for malignant neoplasm of respiratory organs: Secondary | ICD-10-CM

## 2018-10-15 ENCOUNTER — Other Ambulatory Visit: Payer: Self-pay | Admitting: *Deleted

## 2018-10-15 DIAGNOSIS — Z122 Encounter for screening for malignant neoplasm of respiratory organs: Secondary | ICD-10-CM

## 2018-10-15 DIAGNOSIS — F17210 Nicotine dependence, cigarettes, uncomplicated: Secondary | ICD-10-CM

## 2018-10-15 DIAGNOSIS — Z87891 Personal history of nicotine dependence: Secondary | ICD-10-CM

## 2018-11-02 DIAGNOSIS — E785 Hyperlipidemia, unspecified: Secondary | ICD-10-CM | POA: Diagnosis not present

## 2018-11-02 DIAGNOSIS — I10 Essential (primary) hypertension: Secondary | ICD-10-CM | POA: Diagnosis not present

## 2018-11-02 DIAGNOSIS — E1140 Type 2 diabetes mellitus with diabetic neuropathy, unspecified: Secondary | ICD-10-CM | POA: Diagnosis not present

## 2018-11-02 DIAGNOSIS — Z1211 Encounter for screening for malignant neoplasm of colon: Secondary | ICD-10-CM | POA: Diagnosis not present

## 2018-11-02 DIAGNOSIS — R32 Unspecified urinary incontinence: Secondary | ICD-10-CM | POA: Diagnosis not present

## 2018-11-25 ENCOUNTER — Encounter: Payer: Self-pay | Admitting: Cardiology

## 2018-11-25 ENCOUNTER — Ambulatory Visit (INDEPENDENT_AMBULATORY_CARE_PROVIDER_SITE_OTHER): Payer: Medicare Other | Admitting: Cardiology

## 2018-11-25 ENCOUNTER — Other Ambulatory Visit: Payer: Self-pay

## 2018-11-25 VITALS — BP 131/72 | HR 65 | Temp 96.9°F | Ht 65.0 in | Wt 211.6 lb

## 2018-11-25 DIAGNOSIS — I10 Essential (primary) hypertension: Secondary | ICD-10-CM

## 2018-11-25 DIAGNOSIS — E782 Mixed hyperlipidemia: Secondary | ICD-10-CM

## 2018-11-25 DIAGNOSIS — I700 Atherosclerosis of aorta: Secondary | ICD-10-CM | POA: Diagnosis not present

## 2018-11-25 DIAGNOSIS — I2510 Atherosclerotic heart disease of native coronary artery without angina pectoris: Secondary | ICD-10-CM

## 2018-11-25 DIAGNOSIS — Z720 Tobacco use: Secondary | ICD-10-CM | POA: Diagnosis not present

## 2018-11-25 DIAGNOSIS — E669 Obesity, unspecified: Secondary | ICD-10-CM

## 2018-11-25 DIAGNOSIS — E1169 Type 2 diabetes mellitus with other specified complication: Secondary | ICD-10-CM

## 2018-11-25 NOTE — Progress Notes (Signed)
Cardiology Office Note:    Date:  11/25/2018   ID:  SHAREEKA YIM, DOB [birth year], MRN [id number]  PCP:  Maurice Small, MD  Cardiologist:  Buford Dresser, MD PhD (prior Dr. Bettina Gavia and Dr. Wynonia Lawman)  CC: follow up  History of Present Illness:    MCKINSLEY KOELZER is a 73 y.o. female with a hx of CAD, NSTEMI 09/2017 s/p PCI, type II diabetes, hypertension, hyperlipidemia, obesity who is seen in follow up. I initially met her on 09/03/18 as a new patient to me (prior Dr. Georgiann Cocker. Wynonia Lawman) for the evaluation and management of CAD.  Cardiac History: History of CAD and NSTEMI 10/05/2017 s/p PCI to LAD (Dr Wynonia Lawman) Risk factors: tobacco use, type II diabetes, hypertension, hyperlipidemia, obesity Symptoms: shortness of breath, changed from brilinta to clopidogrel by Dr. Bettina Gavia. Since then SOB resolved.  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  Today: No specific complaints today. Tolerating medications well, no complaints. Gabapentin helping foot pain, seeing urologist for bladder spasm.  Going to use the bathroom three times/night. Discussed sleep study, declines for now until nocturia improved.  BP well controlled today. Reviewed lipid results today as well.   Still smoking, discussed again today. She doesn't feel that she can quit right now. Up to 1 ppd. Smokes indoors, wants to try to stop that because she knows it makes her house smell like smoke. Discussed ways to use that as personal motivation for quitting.   Denies chest pain, shortness of breath at rest or with normal exertion. No PND, orthopnea, LE edema or unexpected weight gain. No syncope or palpitations.  Being very careful with Covid, staying home as much as she can, avoids anything other than grocery shopping/drug store.   Past Medical History:  Diagnosis Date  . Aortic atherosclerosis ([place]) 10/05/2017  . Arthritis    "spine, knees" (10/06/2017)  . CAD (coronary artery disease), native coronary artery  10/05/2017   10/06/2016 cardiac catheterization showing 99% LAD stenosis, 20 to 30% RCA and circumflex stenosis, 3.5 x 15 mm Sierra stent in proximal LAD by Dr. Angelena Form  . Cancer ([place])    SKIN CANCDER REMOVED FROM NOSE   . Chronic lower back pain   . Diabetes mellitus type 2, noninsulin dependent ([place]) 10/05/2017  . GERD (gastroesophageal reflux disease)   . Hidradenitis   . Hypercholesteremia   . Hyperlipidemia 10/05/2017  . Hypertension   . Obesity (BMI 30-39.9) 10/05/2017  . Osteoarthritis (arthritis due to wear and tear of joints)    right knee  . Sleep apnea    STOP BANG SCORE 5  . Tobacco use 09/05/2011  . Type II diabetes mellitus ([place])     Past Surgical History:  Procedure Laterality Date  . CORONARY ANGIOPLASTY WITH STENT PLACEMENT  10/06/2017  . CORONARY STENT INTERVENTION N/A 10/06/2017   Procedure: CORONARY STENT INTERVENTION;  Surgeon: Burnell Blanks, MD;  Location: [facility];  Service: Cardiovascular;  Laterality: N/A;  . HERNIA REPAIR    . LEFT HEART CATH AND CORONARY ANGIOGRAPHY N/A 10/06/2017   Procedure: LEFT HEART CATH AND CORONARY ANGIOGRAPHY;  Surgeon: Burnell Blanks, MD;  Location: [facility];  Service: Cardiovascular;  Laterality: N/A;  . MULTIPLE TOOTH EXTRACTIONS  2017  . TONSILLECTOMY  1963  . VENTRAL HERNIA REPAIR  09/04/2011   Procedure: LAPAROSCOPIC VENTRAL HERNIA;  Surgeon: Gayland Curry, MD,FACS;  Location: [facility];  Service: General;  Laterality: N/A;  LAPAROSCOPIC incarcerated VENTRAL HERNIA  repair     Current Medications: Current Outpatient Medications on File Prior to Visit  Medication Sig  . acetaminophen (TYLENOL) 500 MG tablet Take 500 mg by mouth as needed. pain  . alendronate (FOSAMAX) 70 MG tablet Take 70 mg by mouth every 7 (seven) days.  Marland Kitchen aspirin EC 81 MG EC tablet Take 1 tablet (81 mg total) by mouth daily.  Marland Kitchen atorvastatin (LIPITOR) 40 MG tablet Take 40 mg by mouth daily.  . carvedilol (COREG) 12.5 MG  tablet Take 1 tablet (12.5 mg total) by mouth 2 (two) times daily with a meal.  . clopidogrel (PLAVIX) 75 MG tablet Take 2 tablets (150 mg) daily for the first 2 days then take 1 tablet (75 mg) daily.  . CYMBALTA 30 MG capsule Take 60 mg by mouth daily.   Marland Kitchen ezetimibe (ZETIA) 10 MG tablet Take 1 tablet (10 mg total) by mouth daily.  Marland Kitchen gabapentin (NEURONTIN) 100 MG capsule TK 1 C PO TID  . glipiZIDE (GLUCOTROL XL) 5 MG 24 hr tablet Take 5 mg by mouth daily.  Marland Kitchen lisinopril-hydrochlorothiazide (PRINZIDE,ZESTORETIC) 20-25 MG per tablet Take 1 tablet by mouth daily.  . metFORMIN (GLUCOPHAGE) 500 MG tablet Take 1,000 mg by mouth 2 (two) times daily with a meal.  . nitroGLYCERIN (NITROSTAT) 0.4 MG SL tablet Place 1 tablet (0.4 mg total) under the tongue every 5 (five) minutes x 3 doses as needed for chest pain.  Marland Kitchen omeprazole (PRILOSEC) 40 MG capsule Take 40 mg by mouth daily.  . Vitamin D, Ergocalciferol, (DRISDOL) 50000 units CAPS capsule Take 50,000 Units by mouth every 7 (seven) days.   No current facility-administered medications on file prior to visit.      Allergies:   Patient has no known allergies.   Social History   Socioeconomic History  . Marital status: Widowed    Spouse name: Not on file  . Number of children: Not on file  . Years of education: Not on file  . Highest education level: Not on file  Occupational History  . Not on file  Social Needs  . Financial resource strain: Not on file  . Food insecurity    Worry: Not on file    Inability: Not on file  . Transportation needs    Medical: Not on file    Non-medical: Not on file  Tobacco Use  . Smoking status: Current Every Day Smoker    Packs/day: 0.50    Years: 42.00    Pack years: 21.00    Types: Cigarettes  . Smokeless tobacco: Never Used  Substance and Sexual Activity  . Alcohol use: No  . Drug use: No  . Sexual activity: Not on file  Lifestyle  . Physical activity    Days per week: Not on file    Minutes per  session: Not on file  . Stress: Not on file  Relationships  . Social Herbalist on phone: Not on file    Gets together: Not on file    Attends religious service: Not on file    Active member of club or organization: Not on file    Attends meetings of clubs or organizations: Not on file    Relationship status: Not on file  Other Topics Concern  . Not on file  Social History Narrative  . Not on file     Family History: The patient's family history includes Atrial fibrillation in her sister and sister; CAD (age of onset: 41) in her  father; Cancer in her maternal grandfather; Cancer (age of onset: 59) in her sister; Dementia (age of onset: 7) in her mother; Lung cancer (age of onset: 73) in her brother; Premature CHD in her son.  ROS:   Please see the history of present illness.  Additional pertinent ROS: Constitutional: Negative for chills, fever, night sweats, unintentional weight loss  HENT: Negative for ear pain and hearing loss.   Eyes: Negative for loss of vision and eye pain.  Respiratory: Negative for cough, sputum, wheezing.   Cardiovascular: See HPI. Gastrointestinal: Negative for abdominal pain, melena, and hematochezia.  Genitourinary: Negative for dysuria and hematuria.  Musculoskeletal: Negative for falls and myalgias.  Skin: Negative for itching and rash.  Neurological: Negative for focal weakness, focal sensory changes and loss of consciousness.  Endo/Heme/Allergies: Does not bruise/bleed easily.    EKGs/Labs/Other Studies Reviewed:    The following studies were reviewed today: Cath 10/06/2017  Prox RCA lesion is 30% stenosed.  Prox RCA to Dist RCA lesion is 10% stenosed.  Ost Cx lesion is 20% stenosed.  Prox Cx to Mid Cx lesion is 20% stenosed.  Ost 3rd Mrg lesion is 30% stenosed.  Prox LAD lesion is 99% stenosed.  A drug-eluting stent was successfully placed using a STENT SIERRA 3.50 X 15 MM.  Post intervention, there is a 0% residual  stenosis.  The left ventricular systolic function is normal.  LV end diastolic pressure is normal.  The left ventricular ejection fraction is 55-65% by visual estimate.  There is no mitral valve regurgitation.   1. NSTEMI secondary to severe stenosis in the proximal LAD 2. Successful PTCA/DES x 1 proximal LAD 3. Mild non-obstructive disease in the RCA and Circumflex. 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  4. Normal LV systolic function.   Recommend uninterrupted dual antiplatelet therapy with Aspirin 46m daily and Ticagrelor 959mtwice daily for a minimum of 12 months (ACS - Class I recommendation).  EKG:  EKG is personally reviewed.  The ekg ordered 09/06/18 demonstrates sinus bradycardia/sinus arrhythmia  Recent Labs: 09/03/2018: BUN 11; Creatinine, Ser 0.68; Potassium 4.2; Sodium 139  Recent Lipid Panel    Component Value Date/Time   CHOL 166 09/03/2018 1203   TRIG 256 (H) 09/03/2018 1203   HDL 51 09/03/2018 1203   CHOLHDL 3.3 09/03/2018 1203   CHOLHDL 4.2 10/05/2017 1634   VLDL 70 (H) 10/05/2017 1634   LDLCALC 64 09/03/2018 1203    Physical Exam:    VS:  BP 131/72   Pulse 65   Temp (!) 96.9 F (36.1 C)   Ht '5\' 5"'  (1.651 m)   Wt 211 lb 9.6 oz (96 kg)   SpO2 99%   BMI 35.21 kg/m     Wt Readings from Last 3 Encounters:  11/25/18 211 lb 9.6 oz (96 kg)  09/03/18 215 lb (97.5 kg)  01/26/18 218 lb 12.8 oz (99.2 kg)    GEN: Well nourished, well developed in no acute distress HEENT: Normal, moist mucous membranes NECK: No JVD CARDIAC: regular rhythm, normal S1 and S2, no murmurs, rubs, gallops.  VASCULAR: Radial and DP pulses 2+ bilaterally. No carotid bruits RESPIRATORY:  Clear to auscultation without rales, wheezing or rhonchi  ABDOMEN: Soft, non-tender, non-distended MUSCULOSKELETAL:  Ambulates independently SKIN: Warm and dry, no edema NEUROLOGIC:  Alert and oriented x 3. No focal neuro deficits noted. PSYCHIATRIC:  Normal affect    ASSESSMENT:    1. Essential hypertension   2. Tobacco use   3. Mixed  hyperlipidemia   4. Aortic atherosclerosis ([facility])   5. Obesity (BMI 30-39.9)   6. Type 2 diabetes mellitus with obesity ([place])   7. Coronary artery disease involving native coronary artery of native heart without angina pectoris    PLAN:    Hypertension: within goal today -continue carvedilol 12.5 mg BID -continue lisinopril-HCTZ.  -given her snoring, discuss sleep study again at next visit  CAD with prior PCI: denies any recent symptoms -continue aspirin 81 mg -continue atorvastatin 40 mg -also on clopidogrel 75 mg daily. Had SOB on brilinta.  -discuss pro/con of stopping DAPT at next visit  Tobacco abuse and counseling: The patient was counseled on tobacco cessation today for7 minutes.  Counseling included reviewing the risks of smoking tobacco products, how it impacts the patient's current medical diagnoses and different strategies for quitting.  Pharmacotherapy to aid in tobacco cessation was not prescribed today.  HLD: goal LDL <70 -Taking both atorvastatin and ezetimibe -LDL last 64 08/2018, at goal  Cardiac risk counseling and prevention recommendations: -recommend heart healthy/Mediterranean diet, with whole grains, fruits, vegetable, fish, lean meats, nuts, and olive oil. Limit salt. -recommend moderate walking, 3-5 times/week for 30-50 minutes each session. Aim for at least 150 minutes.week. Goal should be pace of 3 miles/hours, or walking 1.5 miles in 30 minutes -recommend avoidance of tobacco products. Avoid excess alcohol. -Additional risk factor control:  -Diabetes: A1c is 6.8.Discussed SLGT2i/GLP1RA, limited because of cost concerns.   -Lipids: as above  -Blood pressure control: as above  -Weight: BMI 35, would benefit from weigh loss  Plan for follow up: 1 year or sooner PRN  Medication Adjustments/Labs and Tests Ordered: Current medicines are reviewed at length with the patient today.   Concerns regarding medicines are outlined above.  No orders of the defined types were placed in this encounter.  No orders of the defined types were placed in this encounter.   Patient Instructions  Medication Instructions:  Your Physician recommend you continue on your current medication as directed.    If you need a refill on your cardiac medications before your next appointment, please call your pharmacy.   Lab work: None  Testing/Procedures: None  Follow-Up: At [facility], you and your health needs are our priority.  As part of our continuing mission to provide you with exceptional heart care, we have created designated Provider Care Teams.  These Care Teams include your primary Cardiologist (physician) and Advanced Practice Providers (APPs -  Physician Assistants and Nurse Practitioners) who all work together to provide you with the care you need, when you need it. You will need a follow up appointment in 1 years.  Please call our office 2 months in advance to schedule this appointment.  You may see Buford Dresser, MD or one of the following Advanced Practice Providers on your designated Care Team:   Rosaria Ferries, PA-C . Jory Sims, DNP, ANP       Signed, Buford Dresser, MD PhD 11/25/2018  [place]

## 2018-11-25 NOTE — Patient Instructions (Signed)

## 2018-11-29 ENCOUNTER — Encounter: Payer: Self-pay | Admitting: Cardiology

## 2018-12-20 ENCOUNTER — Other Ambulatory Visit: Payer: Self-pay

## 2018-12-20 DIAGNOSIS — Z20822 Contact with and (suspected) exposure to covid-19: Secondary | ICD-10-CM

## 2018-12-21 LAB — NOVEL CORONAVIRUS, NAA: SARS-CoV-2, NAA: NOT DETECTED

## 2018-12-23 ENCOUNTER — Telehealth: Payer: Self-pay | Admitting: Family Medicine

## 2018-12-23 NOTE — Telephone Encounter (Signed)
Negative COVID results given. Patient results "NOT Detected." Caller expressed understanding. ° °

## 2019-03-01 DIAGNOSIS — U071 COVID-19: Secondary | ICD-10-CM | POA: Diagnosis not present

## 2019-03-11 ENCOUNTER — Other Ambulatory Visit: Payer: Self-pay

## 2019-03-11 ENCOUNTER — Encounter (HOSPITAL_COMMUNITY): Payer: Self-pay | Admitting: Emergency Medicine

## 2019-03-11 ENCOUNTER — Inpatient Hospital Stay (HOSPITAL_COMMUNITY)
Admission: EM | Admit: 2019-03-11 | Discharge: 2019-03-16 | DRG: 177 | Disposition: A | Discharge status: Home Health | Payer: Medicare Other | Attending: Internal Medicine | Admitting: Internal Medicine

## 2019-03-11 ENCOUNTER — Emergency Department (HOSPITAL_COMMUNITY): Payer: Medicare Other

## 2019-03-11 DIAGNOSIS — Z7984 Long term (current) use of oral hypoglycemic drugs: Secondary | ICD-10-CM

## 2019-03-11 DIAGNOSIS — Z8249 Family history of ischemic heart disease and other diseases of the circulatory system: Secondary | ICD-10-CM | POA: Diagnosis not present

## 2019-03-11 DIAGNOSIS — I214 Non-ST elevation (NSTEMI) myocardial infarction: Secondary | ICD-10-CM

## 2019-03-11 DIAGNOSIS — J129 Viral pneumonia, unspecified: Secondary | ICD-10-CM | POA: Diagnosis not present

## 2019-03-11 DIAGNOSIS — E876 Hypokalemia: Secondary | ICD-10-CM | POA: Diagnosis present

## 2019-03-11 DIAGNOSIS — Z79899 Other long term (current) drug therapy: Secondary | ICD-10-CM | POA: Diagnosis not present

## 2019-03-11 DIAGNOSIS — I10 Essential (primary) hypertension: Secondary | ICD-10-CM | POA: Diagnosis not present

## 2019-03-11 DIAGNOSIS — N390 Urinary tract infection, site not specified: Secondary | ICD-10-CM | POA: Diagnosis not present

## 2019-03-11 DIAGNOSIS — E7800 Pure hypercholesterolemia, unspecified: Secondary | ICD-10-CM | POA: Diagnosis not present

## 2019-03-11 DIAGNOSIS — D649 Anemia, unspecified: Secondary | ICD-10-CM | POA: Diagnosis present

## 2019-03-11 DIAGNOSIS — J1282 Pneumonia due to coronavirus disease 2019: Secondary | ICD-10-CM | POA: Diagnosis present

## 2019-03-11 DIAGNOSIS — E119 Type 2 diabetes mellitus without complications: Secondary | ICD-10-CM | POA: Diagnosis present

## 2019-03-11 DIAGNOSIS — I700 Atherosclerosis of aorta: Secondary | ICD-10-CM | POA: Diagnosis present

## 2019-03-11 DIAGNOSIS — G4730 Sleep apnea, unspecified: Secondary | ICD-10-CM | POA: Diagnosis present

## 2019-03-11 DIAGNOSIS — Z85828 Personal history of other malignant neoplasm of skin: Secondary | ICD-10-CM

## 2019-03-11 DIAGNOSIS — U071 COVID-19: Secondary | ICD-10-CM | POA: Diagnosis not present

## 2019-03-11 DIAGNOSIS — J9601 Acute respiratory failure with hypoxia: Secondary | ICD-10-CM | POA: Diagnosis present

## 2019-03-11 DIAGNOSIS — I959 Hypotension, unspecified: Secondary | ICD-10-CM | POA: Diagnosis not present

## 2019-03-11 DIAGNOSIS — F17210 Nicotine dependence, cigarettes, uncomplicated: Secondary | ICD-10-CM | POA: Diagnosis present

## 2019-03-11 DIAGNOSIS — Z7902 Long term (current) use of antithrombotics/antiplatelets: Secondary | ICD-10-CM

## 2019-03-11 DIAGNOSIS — E669 Obesity, unspecified: Secondary | ICD-10-CM | POA: Diagnosis not present

## 2019-03-11 DIAGNOSIS — E1169 Type 2 diabetes mellitus with other specified complication: Secondary | ICD-10-CM | POA: Diagnosis present

## 2019-03-11 DIAGNOSIS — R0602 Shortness of breath: Secondary | ICD-10-CM | POA: Diagnosis not present

## 2019-03-11 DIAGNOSIS — R778 Other specified abnormalities of plasma proteins: Secondary | ICD-10-CM | POA: Diagnosis not present

## 2019-03-11 DIAGNOSIS — K219 Gastro-esophageal reflux disease without esophagitis: Secondary | ICD-10-CM | POA: Diagnosis present

## 2019-03-11 DIAGNOSIS — R54 Age-related physical debility: Secondary | ICD-10-CM | POA: Diagnosis not present

## 2019-03-11 DIAGNOSIS — Z7982 Long term (current) use of aspirin: Secondary | ICD-10-CM

## 2019-03-11 DIAGNOSIS — R5381 Other malaise: Secondary | ICD-10-CM

## 2019-03-11 DIAGNOSIS — Z6835 Body mass index (BMI) 35.0-35.9, adult: Secondary | ICD-10-CM

## 2019-03-11 DIAGNOSIS — J9600 Acute respiratory failure, unspecified whether with hypoxia or hypercapnia: Secondary | ICD-10-CM | POA: Diagnosis not present

## 2019-03-11 DIAGNOSIS — M545 Low back pain: Secondary | ICD-10-CM | POA: Diagnosis not present

## 2019-03-11 DIAGNOSIS — G8929 Other chronic pain: Secondary | ICD-10-CM | POA: Diagnosis present

## 2019-03-11 DIAGNOSIS — I2510 Atherosclerotic heart disease of native coronary artery without angina pectoris: Secondary | ICD-10-CM | POA: Diagnosis present

## 2019-03-11 DIAGNOSIS — R0902 Hypoxemia: Secondary | ICD-10-CM | POA: Diagnosis not present

## 2019-03-11 DIAGNOSIS — I248 Other forms of acute ischemic heart disease: Secondary | ICD-10-CM | POA: Diagnosis present

## 2019-03-11 DIAGNOSIS — Z955 Presence of coronary angioplasty implant and graft: Secondary | ICD-10-CM

## 2019-03-11 DIAGNOSIS — R7989 Other specified abnormal findings of blood chemistry: Secondary | ICD-10-CM | POA: Diagnosis not present

## 2019-03-11 DIAGNOSIS — E785 Hyperlipidemia, unspecified: Secondary | ICD-10-CM | POA: Diagnosis present

## 2019-03-11 LAB — CBC
HCT: 36.8 % (ref 36.0–46.0)
Hemoglobin: 11.8 g/dL — ABNORMAL LOW (ref 12.0–15.0)
MCH: 26.1 pg (ref 26.0–34.0)
MCHC: 32.1 g/dL (ref 30.0–36.0)
MCV: 81.4 fL (ref 80.0–100.0)
Platelets: 496 10*3/uL — ABNORMAL HIGH (ref 150–400)
RBC: 4.52 MIL/uL (ref 3.87–5.11)
RDW: 14.7 % (ref 11.5–15.5)
WBC: 9.8 10*3/uL (ref 4.0–10.5)
nRBC: 0 % (ref 0.0–0.2)

## 2019-03-11 LAB — FERRITIN: Ferritin: 160 ng/mL (ref 11–307)

## 2019-03-11 LAB — D-DIMER, QUANTITATIVE: D-Dimer, Quant: 1.5 ug/mL-FEU — ABNORMAL HIGH (ref 0.00–0.50)

## 2019-03-11 LAB — PROCALCITONIN: Procalcitonin: 0.23 ng/mL

## 2019-03-11 LAB — BASIC METABOLIC PANEL
Anion gap: 14 (ref 5–15)
BUN: 14 mg/dL (ref 8–23)
CO2: 25 mmol/L (ref 22–32)
Calcium: 9.1 mg/dL (ref 8.9–10.3)
Chloride: 98 mmol/L (ref 98–111)
Creatinine, Ser: 0.7 mg/dL (ref 0.44–1.00)
GFR calc Af Amer: >60 mL/min (ref >60)
GFR calc non Af Amer: >60 mL/min (ref >60)
Glucose, Bld: 176 mg/dL — ABNORMAL HIGH (ref 70–99)
Potassium: 3.3 mmol/L — ABNORMAL LOW (ref 3.5–5.1)
Sodium: 137 mmol/L (ref 135–145)

## 2019-03-11 LAB — FIBRINOGEN: Fibrinogen: >800 mg/dL — ABNORMAL HIGH (ref 210–475)

## 2019-03-11 LAB — TROPONIN I (HIGH SENSITIVITY): Troponin I (High Sensitivity): 720 ng/L (ref <18)

## 2019-03-11 LAB — TRIGLYCERIDES: Triglycerides: 117 mg/dL (ref <150)

## 2019-03-11 LAB — LACTIC ACID, PLASMA: Lactic Acid, Venous: 1.6 mmol/L (ref 0.5–1.9)

## 2019-03-11 LAB — LACTATE DEHYDROGENASE: LDH: 221 U/L — ABNORMAL HIGH (ref 98–192)

## 2019-03-11 LAB — C-REACTIVE PROTEIN: CRP: 14.9 mg/dL — ABNORMAL HIGH (ref <1.0)

## 2019-03-11 MED ORDER — HEPARIN (PORCINE) 25000 UT/250ML-% IV SOLN
1200.0000 [IU]/h | INTRAVENOUS | Status: DC
  Administered 2019-03-12 – 2019-03-13 (×3): 1200 [IU]/h via INTRAVENOUS
  Filled 2019-03-11 (×3): qty 250

## 2019-03-11 MED ORDER — ASPIRIN 81 MG PO CHEW
324.0000 mg | CHEWABLE_TABLET | Freq: Once | ORAL | Status: AC
  Administered 2019-03-12: 324 mg via ORAL
  Filled 2019-03-11: qty 4

## 2019-03-11 MED ORDER — HEPARIN BOLUS VIA INFUSION
4000.0000 [IU] | Freq: Once | INTRAVENOUS | Status: AC
  Administered 2019-03-12: 4000 [IU] via INTRAVENOUS
  Filled 2019-03-11: qty 4000

## 2019-03-11 NOTE — ED Provider Notes (Signed)
[facility] EMERGENCY DEPARTMENT Provider Note   CSN: [id number] Arrival date & time: 03/11/19  1725     History Chief Complaint  Patient presents with  . COVID POSITIVE    Kathryn Bruce is a 74 y.o. female.  HPI Kathryn Bruce is a 74 y.o. female with a medical history of cad, dm, htn, hld who presents to the ED for shortness of breath and right arm pain in the setting of being covid positive ten days ago. She reports shortness of breath for two weeks that worsened today with a mild cough.   She states has pain to her right upper arm which she had when she previously had: 1. NSTEMI secondary to severe stenosis in the proximal LAD 2. Successful PTCA/DES x 1 proximal LAD She reports her pain began today, seems to be waxing and waning.     Past Medical History:  Diagnosis Date  . Aortic atherosclerosis ([place]) 10/05/2017  . Arthritis    "spine, knees" (10/06/2017)  . CAD (coronary artery disease), native coronary artery 10/05/2017   10/06/2016 cardiac catheterization showing 99% LAD stenosis, 20 to 30% RCA and circumflex stenosis, 3.5 x 15 mm Sierra stent in proximal LAD by Dr. Angelena Form  . Cancer ([place])    SKIN CANCDER REMOVED FROM NOSE   . Chronic lower back pain   . Diabetes mellitus type 2, noninsulin dependent ([place]) 10/05/2017  . GERD (gastroesophageal reflux disease)   . Hidradenitis   . Hypercholesteremia   . Hyperlipidemia 10/05/2017  . Hypertension   . Obesity (BMI 30-39.9) 10/05/2017  . Osteoarthritis (arthritis due to wear and tear of joints)    right knee  . Sleep apnea    STOP BANG SCORE 5  . Tobacco use 09/05/2011  . Type II diabetes mellitus [facility])     Patient Active Problem List   Diagnosis Date Noted  . Acute respiratory failure due to COVID-19 ([place]) 03/12/2019  . Acute respiratory failure with hypoxia ([place]) 03/12/2019  . Aortic atherosclerosis ([place]) 10/05/2017  . Mixed hyperlipidemia 10/05/2017  . Obesity (BMI 30-39.9) 10/05/2017  . Type 2  diabetes mellitus with obesity ([place]) 10/05/2017  . CAD (coronary artery disease), native coronary artery 10/05/2017  . Hypertension 09/05/2011  . Tobacco use 09/05/2011    Past Surgical History:  Procedure Laterality Date  . CORONARY ANGIOPLASTY WITH STENT PLACEMENT  10/06/2017  . CORONARY STENT INTERVENTION N/A 10/06/2017   Procedure: CORONARY STENT INTERVENTION;  Surgeon: Burnell Blanks, MD;  Location: [place] [facility] CV LAB;  Service: Cardiovascular;  Laterality: N/A;  . HERNIA REPAIR    . LEFT HEART CATH AND CORONARY ANGIOGRAPHY N/A 10/06/2017   Procedure: LEFT HEART CATH AND CORONARY ANGIOGRAPHY;  Surgeon: Burnell Blanks, MD;  Location: [place] CV LAB;  Service: Cardiovascular;  Laterality: N/A;  . MULTIPLE TOOTH EXTRACTIONS  2017  . TONSILLECTOMY  1963  . VENTRAL HERNIA REPAIR  09/04/2011   Procedure: LAPAROSCOPIC VENTRAL HERNIA;  Surgeon: Gayland Curry, MD,FACS;  Location: [facility];  Service: General;  Laterality: N/A;  LAPAROSCOPIC incarcerated VENTRAL HERNIA  repair      OB History   No obstetric history on file.     Family History  Problem Relation Age of Onset  . Dementia Mother 59  . CAD Father 50  . Cancer Sister 11       lung  . Lung cancer Brother 56  . Cancer Maternal Grandfather        colon  . Premature  CHD Son   . Atrial fibrillation Sister   . Atrial fibrillation Sister     Social History   Tobacco Use  . Smoking status: Current Every June [place] Smoker    Packs/Javeria [place]isk[place]: 0.50    Years: 42.00    Pack years: 21.00    Types: Cigarettes  . Smokeless tobacco: Never Used  Substance Use Topics  . Alcohol use: No  . Drug use: No    Home Medications Prior to Admission medications   Medication Sig Start Date End Date Taking? Authorizing Provider  acetaminophen (TYLENOL) 500 MG tablet Take 1,000 mg by mouth every 8 (eight) hours as needed (pain).    Yes [provider]  aspirin EC 81 MG EC tablet Take 1 tablet (81 mg total) by mouth daily.  10/07/17  Yes Jacolyn Reedy, MD  atorvastatin (LIPITOR) 40 MG tablet Take 40 mg by mouth daily. 08/23/17  Yes [provider]  carvedilol (COREG) 12.5 MG tablet Take 1 tablet (12.5 mg total) by mouth 2 (two) times daily with a meal. 09/03/18 03/11/19 Yes Buford Dresser, MD  clopidogrel (PLAVIX) 75 MG tablet Take 2 tablets (150 mg) daily for the first 2 days then take 1 tablet (75 mg) daily. Patient taking differently: Take 75 mg by mouth daily. take 1 tablet (75 mg) daily. 05/03/18  Yes Richardo Priest, MD  CYMBALTA 30 MG capsule Take 60 mg by mouth daily.  10/29/11  Yes [provider]  ezetimibe (ZETIA) 10 MG tablet Take 1 tablet (10 mg total) by mouth daily. 09/20/18 03/11/19 Yes Buford Dresser, MD  gabapentin (NEURONTIN) 100 MG capsule Take 300 mg by mouth at bedtime.  11/02/18  Yes [provider]  glipiZIDE (GLUCOTROL XL) 5 MG 24 hr tablet Take 5 mg by mouth daily. 08/24/17  Yes [provider]  lisinopril-hydrochlorothiazide (PRINZIDE,ZESTORETIC) 20-25 MG per tablet Take 1 tablet by mouth daily.   Yes [provider]  metFORMIN (GLUCOPHAGE) 500 MG tablet Take 1,000 mg by mouth 2 (two) times daily with a meal.   Yes [provider]  nitroGLYCERIN (NITROSTAT) 0.4 MG SL tablet Place 1 tablet (0.4 mg total) under the tongue every 5 (five) minutes x 3 doses as needed for chest pain. 10/07/17  Yes Jacolyn Reedy, MD  omeprazole (PRILOSEC) 40 MG capsule Take 40 mg by mouth daily. 08/24/17  Yes [provider]  Vitamin D, Ergocalciferol, (DRISDOL) 50000 units CAPS capsule Take 50,000 Units by mouth every 7 (seven) days. Mondays 09/18/17  Yes [provider]    Allergies    Patient has no known allergies.  Review of Systems   Review of Systems  Constitutional: Positive for fatigue. Negative for chills and fever.  HENT: Negative for ear pain and sore throat.   Eyes: Negative for pain and visual disturbance.    Respiratory: Positive for cough and shortness of breath.   Cardiovascular: Positive for chest pain. Negative for palpitations.  Gastrointestinal: Negative for abdominal pain and vomiting.  Genitourinary: Negative for dysuria and hematuria.  Musculoskeletal: Negative for arthralgias and back pain.  Skin: Negative for color change and rash.  Neurological: Negative for seizures and syncope.  All other systems reviewed and are negative.   Physical Exam Updated Vital Signs BP (!) 109/58   Pulse (!) 59   Temp 98.9 F (37.2 C) (Oral)   Resp (!) 23   SpO2 95%   Physical Exam Vitals and nursing note reviewed.  Constitutional:      General: She  is in acute distress.     Appearance: Normal appearance. She is well-developed. She is not ill-appearing.     Comments: Female who appears stated age, mild respiratory distress, appears uncomfortable  HENT:     Head: Normocephalic and atraumatic.     Right Ear: External ear normal.     Left Ear: External ear normal.     Nose: Nose normal. No rhinorrhea.     Mouth/Throat:     Mouth: Mucous membranes are moist.  Eyes:     General:        Right eye: No discharge.        Left eye: No discharge.     Conjunctiva/sclera: Conjunctivae normal.  Cardiovascular:     Rate and Rhythm: Normal rate and regular rhythm.     Pulses: Normal pulses.     Heart sounds: Normal heart sounds. No murmur.  Pulmonary:     Effort: Respiratory distress present.     Breath sounds: Normal breath sounds. No wheezing or rales.     Comments: Coarse lung sounds Abdominal:     General: Abdomen is flat. There is no distension.     Palpations: Abdomen is soft.     Tenderness: There is no abdominal tenderness.  Musculoskeletal:        General: No deformity or signs of injury. Normal range of motion.     Cervical back: Normal range of motion and neck supple.  Skin:    General: Skin is warm and dry.     Capillary Refill: Capillary refill takes less than 2 seconds.      Coloration: Skin is not jaundiced.  Neurological:     General: No focal deficit present.     Mental Status: She is alert. Mental status is at baseline.  Psychiatric:        Mood and Affect: Mood normal.        Behavior: Behavior normal.     ED Results / Procedures / Treatments   Labs (all labs ordered are listed, but only abnormal results are displayed) Labs Reviewed  RESPIRATORY PANEL BY RT PCR (FLU A&B, COVID) - Abnormal; Notable for the following components:      Result Value   SARS Coronavirus 2 by RT PCR POSITIVE (*)    All other components within normal limits  CBC - Abnormal; Notable for the following components:   Hemoglobin 11.8 (*)    Platelets 496 (*)    All other components within normal limits  BASIC METABOLIC PANEL - Abnormal; Notable for the following components:   Potassium 3.3 (*)    Glucose, Bld 176 (*)    All other components within normal limits  D-DIMER, QUANTITATIVE (NOT AT [facility]) - Abnormal; Notable for the following components:   D-Dimer, Quant 1.50 (*)    All other components within normal limits  LACTATE DEHYDROGENASE - Abnormal; Notable for the following components:   LDH 221 (*)    All other components within normal limits  FIBRINOGEN - Abnormal; Notable for the following components:   Fibrinogen >800 (*)    All other components within normal limits  C-REACTIVE PROTEIN - Abnormal; Notable for the following components:   CRP 14.9 (*)    All other components within normal limits  C-REACTIVE PROTEIN - Abnormal; Notable for the following components:   CRP 12.0 (*)    All other components within normal limits  COMPREHENSIVE METABOLIC PANEL - Abnormal; Notable for the following components:   Potassium 3.2 (*)  Glucose, Bld 177 (*)    Calcium 8.5 (*)    Total Protein 6.3 (*)    Albumin 2.4 (*)    All other components within normal limits  CBC WITH DIFFERENTIAL/PLATELET - Abnormal; Notable for the following components:   Hemoglobin 10.9 (*)     HCT 34.9 (*)    MCH 25.8 (*)    Platelets 422 (*)    Abs Immature Granulocytes 0.14 (*)    All other components within normal limits  MAGNESIUM - Abnormal; Notable for the following components:   Magnesium 1.2 (*)    All other components within normal limits  CBG MONITORING, ED - Abnormal; Notable for the following components:   Glucose-Capillary 168 (*)    All other components within normal limits  CBG MONITORING, ED - Abnormal; Notable for the following components:   Glucose-Capillary 171 (*)    All other components within normal limits  TROPONIN I (HIGH SENSITIVITY) - Abnormal; Notable for the following components:   Troponin I (High Sensitivity) 720 (*)    All other components within normal limits  TROPONIN I (HIGH SENSITIVITY) - Abnormal; Notable for the following components:   Troponin I (High Sensitivity) 2,107 (*)    All other components within normal limits  TROPONIN I (HIGH SENSITIVITY) - Abnormal; Notable for the following components:   Troponin I (High Sensitivity) 1,683 (*)    All other components within normal limits  TROPONIN I (HIGH SENSITIVITY) - Abnormal; Notable for the following components:   Troponin I (High Sensitivity) 1,548 (*)    All other components within normal limits  CULTURE, BLOOD (ROUTINE X 2)  CULTURE, BLOOD (ROUTINE X 2)  LACTIC ACID, PLASMA  PROCALCITONIN  FERRITIN  TRIGLYCERIDES  FERRITIN  PROCALCITONIN  BRAIN NATRIURETIC PEPTIDE  HEPARIN LEVEL (UNFRACTIONATED)  COMPREHENSIVE METABOLIC PANEL  CBC WITH DIFFERENTIAL/PLATELET  C-REACTIVE PROTEIN  FERRITIN  ABO/RH  TYPE AND SCREEN  TROPONIN I (HIGH SENSITIVITY)    EKG EKG Interpretation  Date/Time:  Friday March 11 2019 17:39:01 EST Ventricular Rate:  73 PR Interval:  184 QRS Duration: 84 QT Interval:  386 QTC Calculation: 425 R Axis:   4 Text Interpretation: Normal sinus rhythm Possible Anterior infarct , age undetermined Abnormal ECG  T wave changes no longer presnt compared  to July 2019 Confirmed by Sherwood Gambler [phone]) on 03/11/2019 6:52:26 PM   Radiology CT Angio Chest PE W and/or Wo Contrast  Result Date: 03/12/2019 CLINICAL DATA:  Shortness of breath.  COVID-19 positive 10 days ago. EXAM: CT ANGIOGRAPHY CHEST WITH CONTRAST TECHNIQUE: Multidetector CT imaging of the chest was performed using the standard protocol during bolus administration of intravenous contrast. Multiplanar CT image reconstructions and MIPs were obtained to evaluate the vascular anatomy. CONTRAST:  154mL OMNIPAQUE IOHEXOL 350 MG/ML SOLN COMPARISON:  Radiograph yesterday. Chest CT 10/07/2018 FINDINGS: Cardiovascular: There are no filling defects within the pulmonary arteries to suggest pulmonary embolus. Atherosclerosis of the thoracic aorta. No aortic aneurysm. Cannot assess for dissection given phase of contrast tailored to pulmonary artery evaluation. Heart is normal in size. No pericardial effusion. Mediastinum/Nodes: Small mediastinal and hilar lymph nodes, largest right paratracheal measuring 15 mm. Lymph nodes only slightly larger than on prior chest CT. No visualized thyroid nodule. Decompressed esophagus. Lungs/Pleura: Bilateral ground-glass and consolidative airspace opacities throughout both lungs, most prominent in the lower lobes. No significant pleural fluid. Trachea and mainstem bronchi are patent. Upper Abdomen: No acute findings. Musculoskeletal: There are no acute or suspicious osseous abnormalities. Review of the MIP  images confirms the above findings. IMPRESSION: 1. No pulmonary embolus. 2. Bilateral ground-glass and consolidative airspace opacities throughout both lungs consistent with pneumonia, pattern typical of COVID-19. 3. Mild mediastinal and hilar adenopathy, chronic with nodes only slightly increased from prior chest CT, considered benign/reactive. Aortic Atherosclerosis (ICD10-I70.0). Electronically Signed   By: Keith Rake M.D.   On: 03/12/2019 04:19   DG Chest Portable  1 View  Result Date: 03/11/2019 CLINICAL DATA:  Viral pneumonia EXAM: PORTABLE CHEST 1 VIEW COMPARISON:  January 15, 2018 FINDINGS: There are scattered pulmonary opacities bilaterally. There is some consolidation at the left lung base. The heart size is unchanged from prior study. There are aortic calcifications. There is no pneumothorax. No significant pleural effusion. IMPRESSION: Scattered bilateral pulmonary opacities consistent with the patient's history of viral pneumonia. Electronically Signed   By: Constance Holster M.D.   On: 03/11/2019 19:12    Procedures Procedures (including critical care time)  Medications Ordered in ED Medications  heparin ADULT infusion 100 units/mL (25000 units/294mL sodium chloride 0.45%) (1,200 Units/hr Intravenous New Bag/Given 03/12/19 0049)  aspirin EC tablet 81 mg (81 mg Oral Given 03/12/19 0921)  atorvastatin (LIPITOR) tablet 40 mg (has no administration in time range)  carvedilol (COREG) tablet 12.5 mg (0 mg Oral Hold 03/12/19 0844)  ezetimibe (ZETIA) tablet 10 mg (has no administration in time range)  nitroGLYCERIN (NITROSTAT) SL tablet 0.4 mg (has no administration in time range)  DULoxetine (CYMBALTA) DR capsule 60 mg (has no administration in time range)  pantoprazole (PROTONIX) EC tablet 40 mg (40 mg Oral Given 03/12/19 0921)  clopidogrel (PLAVIX) tablet 75 mg (75 mg Oral Given 03/12/19 0921)  gabapentin (NEURONTIN) capsule 300 mg (has no administration in time range)  acetaminophen (TYLENOL) tablet 650 mg (has no administration in time range)    Or  acetaminophen (TYLENOL) suppository 650 mg (has no administration in time range)  ondansetron (ZOFRAN) tablet 4 mg (has no administration in time range)    Or  ondansetron (ZOFRAN) injection 4 mg (has no administration in time range)  insulin aspart (novoLOG) injection 0-9 Units (1 Units Subcutaneous Given 03/12/19 0847)  lisinopril (ZESTRIL) tablet 20 mg (0 mg Oral Hold 03/12/19 0921)  remdesivir 200 mg in  sodium chloride 0.9% 250 mL IVPB (0 mg Intravenous Stopped 03/12/19 0854)    Followed by  remdesivir 100 mg in sodium chloride 0.9 % 100 mL IVPB (has no administration in time range)  dexamethasone (DECADRON) injection 6 mg (6 mg Intravenous Given 03/12/19 0844)  aspirin chewable tablet 324 mg (324 mg Oral Given 03/12/19 0051)  heparin bolus via infusion 4,000 Units (4,000 Units Intravenous Bolus from Bag 03/12/19 0049)  iohexol (OMNIPAQUE) 350 MG/ML injection 100 mL (100 mLs Intravenous Contrast Given 03/12/19 0408)  potassium chloride SA (KLOR-CON) CR tablet 20 mEq (20 mEq Oral Given 03/12/19 0920)    ED Course  I have reviewed the triage vital signs and the nursing notes.  Pertinent labs & imaging results that were available during my care of the patient were reviewed by me and considered in my medical decision making (see chart for details).    MDM Rules/Calculators/A&P                      KYNNEDI DER is a 74 y.o. female with a medical history of cad, dm, htn, hld who presents to the ED for shortness of breath and right arm pain in the setting of being covid positive ten days ago.  She reports shortness of breath for two weeks that worsened today with a mild cough. She reports her pain began today, seems to be waxing and waning. She states has pain to her right upper arm which she had when she previously had nstemi.  HPI and physical exam as above. Female who appears stated age, mild respiratory distress, appears uncomfortable, awake, alert, hemodynamically stable, afebrile, non toxic.   She has a new oxygen requirement of 2 liters, consistent with worsening covid infection. Concern for nstemi, troponin is 790, ecg is non ischemic. We started her on heparin, she was given asa 324. Consulted cardiology. Unable to reach cardiology after three attempts. Consulted hospitalist who recommends cardiology evaluation prior to medicine admission. Will continue management at this time in ed and attempt to  reach cardiology. This plan was discussed with team at signout.   Pt care was handed off to [employer] at [phone].  Complete history and physical and current plan have been communicated.  Please refer to their note for the remainder of ED care and ultimate disposition. The above statements and care plan were discussed with my attending physician Dr Regenia Skeeter who voiced agreement with plan.    Final Clinical Impression(s) / ED Diagnoses Final diagnoses:  None    Rx / DC Orders ED Discharge Orders    None       Nik Gorrell, Lovena Le, MD 03/12/19 1436    Sherwood Gambler, MD 03/14/19 1156

## 2019-03-11 NOTE — ED Triage Notes (Signed)
Per [facility] pt coming from home, states diagnosed with Covid about 10 days ago. Having shortness of breath x 1 month but family reports decreased oxygen saturation over the past couple days. EMS states initial 02 saturation 86-89% RA and placed on 3L [place] now 96%.

## 2019-03-11 NOTE — Progress Notes (Signed)
ANTICOAGULATION CONSULT NOTE - Initial Consult  Pharmacy Consult for Heparin Indication: chest pain/ACS  No Known Allergies  Patient Measurements:   Heparin Dosing Weight: 80 kg  Vital Signs: Temp: 98.9 F (37.2 C) (01/01 1739) Temp Source: Oral (01/01 1739) BP: 107/66 (01/01 2230) Pulse Rate: 57 (01/01 2200)  Labs: Recent Labs    03/11/19 1755 03/11/19 2130  HGB 11.8*  --   HCT 36.8  --   PLT 496*  --   CREATININE 0.70  --   TROPONINIHS  --  720*    CrCl cannot be calculated (Unknown ideal weight.).   Medical History: Past Medical History:  Diagnosis Date  . Aortic atherosclerosis ([place]) 10/05/2017  . Arthritis    "spine, knees" (10/06/2017)  . CAD (coronary artery disease), native coronary artery 10/05/2017   10/06/2016 cardiac catheterization showing 99% LAD stenosis, 20 to 30% RCA and circumflex stenosis, 3.5 x 15 mm Sierra stent in proximal LAD by Dr. Angelena Form  . Cancer ([place])    SKIN CANCDER REMOVED FROM NOSE   . Chronic lower back pain   . Diabetes mellitus type 2, noninsulin dependent ([place]) 10/05/2017  . GERD (gastroesophageal reflux disease)   . Hidradenitis   . Hypercholesteremia   . Hyperlipidemia 10/05/2017  . Hypertension   . Obesity (BMI 30-39.9) 10/05/2017  . Osteoarthritis (arthritis due to wear and tear of joints)    right knee  . Sleep apnea    STOP BANG SCORE 5  . Tobacco use 09/05/2011  . Type II diabetes mellitus ([facility])     Medications:  No current facility-administered medications on file prior to encounter.   Current Outpatient Medications on File Prior to Encounter  Medication Sig Dispense Refill  . acetaminophen (TYLENOL) 500 MG tablet Take 1,000 mg by mouth every 8 (eight) hours as needed (pain).     Marland Kitchen aspirin EC 81 MG EC tablet Take 1 tablet (81 mg total) by mouth daily.    Marland Kitchen atorvastatin (LIPITOR) 40 MG tablet Take 40 mg by mouth daily.  1  . carvedilol (COREG) 12.5 MG tablet Take 1 tablet (12.5 mg total) by mouth 2 (two) times daily  with a meal. 180 tablet 0  . clopidogrel (PLAVIX) 75 MG tablet Take 2 tablets (150 mg) daily for the first 2 days then take 1 tablet (75 mg) daily. (Patient taking differently: Take 75 mg by mouth daily. take 1 tablet (75 mg) daily.) 90 tablet 2  . CYMBALTA 30 MG capsule Take 60 mg by mouth daily.     Marland Kitchen ezetimibe (ZETIA) 10 MG tablet Take 1 tablet (10 mg total) by mouth daily. 90 tablet 3  . gabapentin (NEURONTIN) 100 MG capsule Take 300 mg by mouth at bedtime.     Marland Kitchen glipiZIDE (GLUCOTROL XL) 5 MG 24 hr tablet Take 5 mg by mouth daily.  1  . lisinopril-hydrochlorothiazide (PRINZIDE,ZESTORETIC) 20-25 MG per tablet Take 1 tablet by mouth daily.    . metFORMIN (GLUCOPHAGE) 500 MG tablet Take 1,000 mg by mouth 2 (two) times daily with a meal.    . nitroGLYCERIN (NITROSTAT) 0.4 MG SL tablet Place 1 tablet (0.4 mg total) under the tongue every 5 (five) minutes x 3 doses as needed for chest pain. 25 tablet 12  . omeprazole (PRILOSEC) 40 MG capsule Take 40 mg by mouth daily.  1  . Vitamin D, Ergocalciferol, (DRISDOL) 50000 units CAPS capsule Take 50,000 Units by mouth every 7 (seven) days. Mondays  3  . [DISCONTINUED] alendronate (FOSAMAX) 70  MG tablet Take 70 mg by mouth every 7 (seven) days.  11     Assessment: 74 y.o. female with SOB and elevated cardiac markers, COVD+, for heparin  Goal of Therapy:  Heparin level 0.3-0.7 units/ml Monitor platelets by anticoagulation protocol: Yes   Plan:  Heparin 4000 units IV bolus, then start heparin 1200 units/hr Check heparin level in 8 hours.   Caryl Pina 03/11/2019,11:46 PM

## 2019-03-12 ENCOUNTER — Inpatient Hospital Stay (HOSPITAL_COMMUNITY): Payer: Medicare Other

## 2019-03-12 ENCOUNTER — Encounter (HOSPITAL_COMMUNITY): Payer: Self-pay | Admitting: Internal Medicine

## 2019-03-12 DIAGNOSIS — J9601 Acute respiratory failure with hypoxia: Secondary | ICD-10-CM | POA: Diagnosis present

## 2019-03-12 DIAGNOSIS — E7800 Pure hypercholesterolemia, unspecified: Secondary | ICD-10-CM | POA: Diagnosis present

## 2019-03-12 DIAGNOSIS — J9600 Acute respiratory failure, unspecified whether with hypoxia or hypercapnia: Secondary | ICD-10-CM

## 2019-03-12 DIAGNOSIS — E669 Obesity, unspecified: Secondary | ICD-10-CM | POA: Diagnosis present

## 2019-03-12 DIAGNOSIS — G8929 Other chronic pain: Secondary | ICD-10-CM | POA: Diagnosis present

## 2019-03-12 DIAGNOSIS — I2510 Atherosclerotic heart disease of native coronary artery without angina pectoris: Secondary | ICD-10-CM | POA: Diagnosis present

## 2019-03-12 DIAGNOSIS — E1169 Type 2 diabetes mellitus with other specified complication: Secondary | ICD-10-CM | POA: Diagnosis not present

## 2019-03-12 DIAGNOSIS — R7989 Other specified abnormal findings of blood chemistry: Secondary | ICD-10-CM | POA: Diagnosis not present

## 2019-03-12 DIAGNOSIS — I700 Atherosclerosis of aorta: Secondary | ICD-10-CM | POA: Diagnosis present

## 2019-03-12 DIAGNOSIS — K219 Gastro-esophageal reflux disease without esophagitis: Secondary | ICD-10-CM | POA: Diagnosis present

## 2019-03-12 DIAGNOSIS — R778 Other specified abnormalities of plasma proteins: Secondary | ICD-10-CM | POA: Diagnosis not present

## 2019-03-12 DIAGNOSIS — U071 COVID-19: Principal | ICD-10-CM

## 2019-03-12 DIAGNOSIS — D649 Anemia, unspecified: Secondary | ICD-10-CM | POA: Diagnosis present

## 2019-03-12 DIAGNOSIS — E785 Hyperlipidemia, unspecified: Secondary | ICD-10-CM | POA: Diagnosis present

## 2019-03-12 DIAGNOSIS — Z8249 Family history of ischemic heart disease and other diseases of the circulatory system: Secondary | ICD-10-CM | POA: Diagnosis not present

## 2019-03-12 DIAGNOSIS — N390 Urinary tract infection, site not specified: Secondary | ICD-10-CM | POA: Diagnosis present

## 2019-03-12 DIAGNOSIS — M545 Low back pain: Secondary | ICD-10-CM | POA: Diagnosis present

## 2019-03-12 DIAGNOSIS — F17210 Nicotine dependence, cigarettes, uncomplicated: Secondary | ICD-10-CM | POA: Diagnosis present

## 2019-03-12 DIAGNOSIS — I10 Essential (primary) hypertension: Secondary | ICD-10-CM | POA: Diagnosis present

## 2019-03-12 DIAGNOSIS — Z7984 Long term (current) use of oral hypoglycemic drugs: Secondary | ICD-10-CM | POA: Diagnosis not present

## 2019-03-12 DIAGNOSIS — E876 Hypokalemia: Secondary | ICD-10-CM | POA: Diagnosis present

## 2019-03-12 DIAGNOSIS — J1282 Pneumonia due to coronavirus disease 2019: Secondary | ICD-10-CM | POA: Diagnosis present

## 2019-03-12 DIAGNOSIS — Z79899 Other long term (current) drug therapy: Secondary | ICD-10-CM | POA: Diagnosis not present

## 2019-03-12 DIAGNOSIS — E119 Type 2 diabetes mellitus without complications: Secondary | ICD-10-CM | POA: Diagnosis present

## 2019-03-12 DIAGNOSIS — I248 Other forms of acute ischemic heart disease: Secondary | ICD-10-CM | POA: Diagnosis present

## 2019-03-12 DIAGNOSIS — G4730 Sleep apnea, unspecified: Secondary | ICD-10-CM | POA: Diagnosis present

## 2019-03-12 DIAGNOSIS — Z85828 Personal history of other malignant neoplasm of skin: Secondary | ICD-10-CM | POA: Diagnosis not present

## 2019-03-12 LAB — COMPREHENSIVE METABOLIC PANEL
ALT: 19 U/L (ref 0–44)
AST: 21 U/L (ref 15–41)
Albumin: 2.4 g/dL — ABNORMAL LOW (ref 3.5–5.0)
Alkaline Phosphatase: 50 U/L (ref 38–126)
Anion gap: 13 (ref 5–15)
BUN: 13 mg/dL (ref 8–23)
CO2: 27 mmol/L (ref 22–32)
Calcium: 8.5 mg/dL — ABNORMAL LOW (ref 8.9–10.3)
Chloride: 98 mmol/L (ref 98–111)
Creatinine, Ser: 0.71 mg/dL (ref 0.44–1.00)
GFR calc Af Amer: >60 mL/min (ref >60)
GFR calc non Af Amer: >60 mL/min (ref >60)
Glucose, Bld: 177 mg/dL — ABNORMAL HIGH (ref 70–99)
Potassium: 3.2 mmol/L — ABNORMAL LOW (ref 3.5–5.1)
Sodium: 138 mmol/L (ref 135–145)
Total Bilirubin: 0.7 mg/dL (ref 0.3–1.2)
Total Protein: 6.3 g/dL — ABNORMAL LOW (ref 6.5–8.1)

## 2019-03-12 LAB — TYPE AND SCREEN
ABO/RH(D): O POS
Antibody Screen: NEGATIVE

## 2019-03-12 LAB — CBG MONITORING, ED
Glucose-Capillary: 168 mg/dL — ABNORMAL HIGH (ref 70–99)
Glucose-Capillary: 171 mg/dL — ABNORMAL HIGH (ref 70–99)
Glucose-Capillary: 295 mg/dL — ABNORMAL HIGH (ref 70–99)
Glucose-Capillary: 308 mg/dL — ABNORMAL HIGH (ref 70–99)

## 2019-03-12 LAB — RESPIRATORY PANEL BY RT PCR (FLU A&B, COVID)
Influenza A by PCR: NEGATIVE
Influenza B by PCR: NEGATIVE
SARS Coronavirus 2 by RT PCR: POSITIVE — AB

## 2019-03-12 LAB — CBC WITH DIFFERENTIAL/PLATELET
Abs Immature Granulocytes: 0.14 10*3/uL — ABNORMAL HIGH (ref 0.00–0.07)
Basophils Absolute: 0 10*3/uL (ref 0.0–0.1)
Basophils Relative: 0 %
Eosinophils Absolute: 0.1 10*3/uL (ref 0.0–0.5)
Eosinophils Relative: 1 %
HCT: 34.9 % — ABNORMAL LOW (ref 36.0–46.0)
Hemoglobin: 10.9 g/dL — ABNORMAL LOW (ref 12.0–15.0)
Immature Granulocytes: 2 %
Lymphocytes Relative: 28 %
Lymphs Abs: 2.3 10*3/uL (ref 0.7–4.0)
MCH: 25.8 pg — ABNORMAL LOW (ref 26.0–34.0)
MCHC: 31.2 g/dL (ref 30.0–36.0)
MCV: 82.5 fL (ref 80.0–100.0)
Monocytes Absolute: 0.6 10*3/uL (ref 0.1–1.0)
Monocytes Relative: 7 %
Neutro Abs: 5 10*3/uL (ref 1.7–7.7)
Neutrophils Relative %: 62 %
Platelets: 422 10*3/uL — ABNORMAL HIGH (ref 150–400)
RBC: 4.23 MIL/uL (ref 3.87–5.11)
RDW: 15 % (ref 11.5–15.5)
WBC: 8.2 10*3/uL (ref 4.0–10.5)
nRBC: 0 % (ref 0.0–0.2)

## 2019-03-12 LAB — C-REACTIVE PROTEIN: CRP: 12 mg/dL — ABNORMAL HIGH (ref <1.0)

## 2019-03-12 LAB — MAGNESIUM: Magnesium: 1.2 mg/dL — ABNORMAL LOW (ref 1.7–2.4)

## 2019-03-12 LAB — PROCALCITONIN: Procalcitonin: <0.1 ng/mL

## 2019-03-12 LAB — TROPONIN I (HIGH SENSITIVITY)
Troponin I (High Sensitivity): 2107 ng/L (ref <18)
Troponin I (High Sensitivity): 1683 ng/L (ref <18)
Troponin I (High Sensitivity): 1548 ng/L (ref <18)

## 2019-03-12 LAB — HEPARIN LEVEL (UNFRACTIONATED): Heparin Unfractionated: 0.34 IU/mL (ref 0.30–0.70)

## 2019-03-12 LAB — BRAIN NATRIURETIC PEPTIDE: B Natriuretic Peptide: 53.9 pg/mL (ref 0.0–100.0)

## 2019-03-12 LAB — FERRITIN: Ferritin: 148 ng/mL (ref 11–307)

## 2019-03-12 MED ORDER — GABAPENTIN 300 MG PO CAPS
300.0000 mg | ORAL_CAPSULE | Freq: Every day | ORAL | Status: DC
  Administered 2019-03-12 – 2019-03-15 (×4): 300 mg via ORAL
  Filled 2019-03-12 (×4): qty 1

## 2019-03-12 MED ORDER — ONDANSETRON HCL 4 MG/2ML IJ SOLN: 4.0000 mg | Freq: Four times a day (QID) | INTRAMUSCULAR | Status: DC | PRN

## 2019-03-12 MED ORDER — ONDANSETRON HCL 4 MG PO TABS: 4.0000 mg | ORAL_TABLET | Freq: Four times a day (QID) | ORAL | Status: DC | PRN

## 2019-03-12 MED ORDER — ACETAMINOPHEN 650 MG RE SUPP: 650.0000 mg | Freq: Four times a day (QID) | RECTAL | Status: DC | PRN

## 2019-03-12 MED ORDER — CLOPIDOGREL BISULFATE 75 MG PO TABS
75.0000 mg | ORAL_TABLET | Freq: Every day | ORAL | Status: DC
  Administered 2019-03-12 – 2019-03-16 (×5): 75 mg via ORAL
  Filled 2019-03-12 (×6): qty 1

## 2019-03-12 MED ORDER — LISINOPRIL-HYDROCHLOROTHIAZIDE 20-25 MG PO TABS: 1.0000 | ORAL_TABLET | Freq: Every day | ORAL | Status: DC

## 2019-03-12 MED ORDER — LISINOPRIL 20 MG PO TABS
20.0000 mg | ORAL_TABLET | Freq: Every day | ORAL | Status: DC
  Administered 2019-03-13 – 2019-03-16 (×4): 20 mg via ORAL
  Filled 2019-03-12 (×5): qty 1

## 2019-03-12 MED ORDER — SODIUM CHLORIDE 0.9 % IV SOLN
200.0000 mg | Freq: Once | INTRAVENOUS | Status: AC
  Administered 2019-03-12: 200 mg via INTRAVENOUS
  Filled 2019-03-12: qty 40

## 2019-03-12 MED ORDER — ATORVASTATIN CALCIUM 40 MG PO TABS
40.0000 mg | ORAL_TABLET | Freq: Every day | ORAL | Status: DC
  Administered 2019-03-12 – 2019-03-16 (×5): 40 mg via ORAL
  Filled 2019-03-12 (×4): qty 1

## 2019-03-12 MED ORDER — ASPIRIN EC 81 MG PO TBEC
81.0000 mg | DELAYED_RELEASE_TABLET | Freq: Every day | ORAL | Status: DC
  Administered 2019-03-12 – 2019-03-16 (×5): 81 mg via ORAL
  Filled 2019-03-12 (×5): qty 1

## 2019-03-12 MED ORDER — HYDROCHLOROTHIAZIDE 25 MG PO TABS: 25.0000 mg | ORAL_TABLET | Freq: Every day | ORAL | Status: DC

## 2019-03-12 MED ORDER — CARVEDILOL 12.5 MG PO TABS
12.5000 mg | ORAL_TABLET | Freq: Two times a day (BID) | ORAL | Status: DC
  Administered 2019-03-13 – 2019-03-16 (×3): 12.5 mg via ORAL
  Filled 2019-03-12 (×6): qty 1

## 2019-03-12 MED ORDER — DULOXETINE HCL 60 MG PO CPEP
60.0000 mg | ORAL_CAPSULE | Freq: Every day | ORAL | Status: DC
  Administered 2019-03-12 – 2019-03-16 (×5): 60 mg via ORAL
  Filled 2019-03-12 (×5): qty 1

## 2019-03-12 MED ORDER — NITROGLYCERIN 0.4 MG SL SUBL: 0.4000 mg | SUBLINGUAL_TABLET | SUBLINGUAL | Status: DC | PRN

## 2019-03-12 MED ORDER — SODIUM CHLORIDE 0.9 % IV SOLN
100.0000 mg | Freq: Every day | INTRAVENOUS | Status: AC
  Administered 2019-03-13 – 2019-03-16 (×4): 100 mg via INTRAVENOUS
  Filled 2019-03-12 (×2): qty 20
  Filled 2019-03-12: qty 100
  Filled 2019-03-12 (×2): qty 20

## 2019-03-12 MED ORDER — ACETAMINOPHEN 325 MG PO TABS
650.0000 mg | ORAL_TABLET | Freq: Four times a day (QID) | ORAL | Status: DC | PRN
  Filled 2019-03-12: qty 2

## 2019-03-12 MED ORDER — IOHEXOL 350 MG/ML SOLN
100.0000 mL | Freq: Once | INTRAVENOUS | Status: AC | PRN
  Administered 2019-03-12: 100 mL via INTRAVENOUS

## 2019-03-12 MED ORDER — INSULIN ASPART 100 UNIT/ML ~~LOC~~ SOLN
0.0000 [IU] | Freq: Three times a day (TID) | SUBCUTANEOUS | Status: DC
  Administered 2019-03-12: 5 [IU] via SUBCUTANEOUS
  Administered 2019-03-12: 2 [IU] via SUBCUTANEOUS
  Administered 2019-03-12: 1 [IU] via SUBCUTANEOUS
  Administered 2019-03-13: 5 [IU] via SUBCUTANEOUS
  Administered 2019-03-13: 3 [IU] via SUBCUTANEOUS
  Administered 2019-03-13: 1 [IU] via SUBCUTANEOUS
  Administered 2019-03-14: 3 [IU] via SUBCUTANEOUS
  Administered 2019-03-14: 2 [IU] via SUBCUTANEOUS
  Administered 2019-03-15: 7 [IU] via SUBCUTANEOUS
  Administered 2019-03-15: 2 [IU] via SUBCUTANEOUS

## 2019-03-12 MED ORDER — DEXAMETHASONE SODIUM PHOSPHATE 10 MG/ML IJ SOLN
6.0000 mg | Freq: Every day | INTRAMUSCULAR | Status: DC
  Administered 2019-03-12 – 2019-03-16 (×5): 6 mg via INTRAVENOUS
  Filled 2019-03-12 (×5): qty 1

## 2019-03-12 MED ORDER — MAGNESIUM SULFATE 2 GM/50ML IV SOLN
2.0000 g | Freq: Once | INTRAVENOUS | Status: AC
  Administered 2019-03-12: 2 g via INTRAVENOUS
  Filled 2019-03-12: qty 50

## 2019-03-12 MED ORDER — PANTOPRAZOLE SODIUM 40 MG PO TBEC
40.0000 mg | DELAYED_RELEASE_TABLET | Freq: Every day | ORAL | Status: DC
  Administered 2019-03-12 – 2019-03-16 (×5): 40 mg via ORAL
  Filled 2019-03-12 (×5): qty 1

## 2019-03-12 MED ORDER — POTASSIUM CHLORIDE CRYS ER 20 MEQ PO TBCR
20.0000 meq | EXTENDED_RELEASE_TABLET | Freq: Once | ORAL | Status: AC
  Administered 2019-03-12: 20 meq via ORAL
  Filled 2019-03-12: qty 1

## 2019-03-12 MED ORDER — EZETIMIBE 10 MG PO TABS
10.0000 mg | ORAL_TABLET | Freq: Every day | ORAL | Status: DC
  Administered 2019-03-12 – 2019-03-16 (×5): 10 mg via ORAL
  Filled 2019-03-12 (×5): qty 1

## 2019-03-12 NOTE — ED Notes (Signed)
Back from CT

## 2019-03-12 NOTE — ED Notes (Signed)
Breakfast Ordered 

## 2019-03-12 NOTE — Progress Notes (Signed)
ANTICOAGULATION CONSULT NOTE  Pharmacy Consult for Heparin Indication: chest pain/ACS  No Known Allergies  Patient Measurements:   Heparin Dosing Weight: 80 kg  Vital Signs: BP: 109/58 (01/02 0921) Pulse Rate: 59 (01/02 0915)  Labs: Recent Labs    03/11/19 1755 03/12/19 0343 03/12/19 0849 03/12/19 0950  HGB 11.8*  --  10.9*  --   HCT 36.8  --  34.9*  --   PLT 496*  --  422*  --   HEPARINUNFRC  --   --   --  0.34  CREATININE 0.70  --  0.71  --   TROPONINIHS  --  2,107* 1,683* 1,548*    CrCl cannot be calculated (Unknown ideal weight.).  Assessment: 74 y.o. female with SOB and elevated cardiac markers, COVD+ continues on IV heparin for r/o ACS. Heparin level is therapeutic. No bleeding noted.   Goal of Therapy:  Heparin level 0.3-0.7 units/ml Monitor platelets by anticoagulation protocol: Yes   Plan:  Continue heparin gtt 1200 units/hr Daily heparin level and CBC  Salome Arnt, PharmD, BCPS Clinical Pharmacist Please see AMION for all pharmacy numbers 03/12/2019 11:02 AM

## 2019-03-12 NOTE — ED Provider Notes (Signed)
NSTEMI, COVID+ Today with SOB, hypoxia, new O2 requirement Previous CAD with stents presenting with right arm pain, similar to today Troponin 720 Hospitalist [facility]) requesting cardiology consultation to help coordinate care Patient is being heparinized, has received ASA, is pain free currently.  Cardiology has been paged, Dr. Fransico Him returned call and suggests that NSTEMI may not be present. Requests CTA PE study given new O2 requirement. Requests she be called back if negative and she will provide a virtual consultation at that time.   This discussion was conveyed to Dr. Hal Hope, [facility], who will go forward with the admission.   Charlann Lange, PA-C 03/12/19 [id number]    Merryl Hacker, MD 03/13/19 [phone]

## 2019-03-12 NOTE — ED Notes (Signed)
Lunch Tray Ordered @ 1128. 

## 2019-03-12 NOTE — Consult Note (Addendum)
Cardiology Consultation:   Patient ID: Kathryn Bruce MRN: [id number]; DOB: 07/13/1945  Admit date: 03/11/2019 Date of Consult: 03/12/2019  Primary Care Provider: Maurice Small, MD Primary Cardiologist: Buford Dresser, MD  Primary Electrophysiologist:  None   Patient Profile:   Kathryn Bruce is a 74 y.o. female with a history of CAD with NSTEMI in 09/2017 s/p DES to proximal LAD, hypertension, hyperlipidemia, type 2 diabetes mellitus, obesity, and previous tobacco use who is being seen today for the evaluation of elevated troponin in setting of COVID-19 at the request of Dr. Tyrell Antonio.  History of Present Illness:   Ms. Kathryn Bruce is a 74 year old female with the above history. She was previously followed by Dr. Bettina Gavia and Dr. Wynonia Lawman but now follows with Dr. Harrell Gave. Patient admitted with NSTEMI in 09/2017 and was treated with DES to proximal LAD. Patient was last seen by Dr. Harrell Gave in 11/2018 at which time she was doing well from a cardiac standpoint. She was still smoking at that time up to 1 pack per day and did not feel like she could quit. Patient currently on Aspirin, Plavix, Lipitor, Zetia, Coreg, Lisinopril-HCTZ, Metformin, and Glipizide at home.  Patient presented to the [facility] ED on 03/11/2019 via EMS for ongoing shortness of breath and decreased O2 sats after being diagnosed with COVID about 10 days prior. Upon EMS arrival, O2 sats 86-89%. Patient was placed on 3 L of nasal cannula with improvement.   Upon arrival to the ED, vitals stable. O2 sats in the 90's on nasal cannula. EKG showed normal sinus rhythm with no acute ST/T changes. High-sensitivity troponin elevated at 720 >> 2,107. Chest x-ray showed scattered bilateral pulmonary opacities consistent with patient's history of viral pneumonia. D-dimer elevated at 1.5. Chest CTA negative for PE but did show bilateral ground-glass and consolidative airspace opacities throughout both lungs consistent with pneumonia in a  pattern typical of COVID-19. WBC 9.8, Hgb 11.8, Plts 496. Na 137, K 3.3, Glucose 176, Cr 0.70. Patient was admitted for further management of acute respiratory failure secondary to COVID-19 infection. IV Heparin was started given troponin elevation and Cardiology was consulted.   Called into patient's room and spoke with her on the phone. Patient denies any chest pain but she does report 7-8/10 squeezing right arm pain yesterday that was similar to the pain she had prior to her NSTEMI in 09/2017. Pain started yesterday and lasted for about 2 hours but then resolved when she got to the ED. Currently not having any chest or arm pain. Patient reports shortness of breath for a while now but states it has been getting worse over the last several days. She was diagnosed with COVID about 10 days ago. She has a pulse ox at home and has been monitor her O2 sats at home and states they have ranged anywhere from 84-94%. They have been lower recently which is why patient called 911. She also reports a mild productive cough and some nausea and diarrhea since being diagnosed with COVID. No orthopnea, PND, or lower extremity edema. She notes some lightheadedness with quick position changes but denies any falls or syncope. No abnormal bleeding.  Patient states she quit smoking at the beginning of the pandemic.   Heart Pathway Score:     Past Medical History:  Diagnosis Date  . Aortic atherosclerosis ([place]) 10/05/2017  . Arthritis    "spine, knees" (10/06/2017)  . CAD (coronary artery disease), native coronary artery 10/05/2017   10/06/2016 cardiac catheterization showing 99%  LAD stenosis, 20 to 30% RCA and circumflex stenosis, 3.5 x 15 mm Sierra stent in proximal LAD by Dr. Angelena Form  . Cancer ([place])    SKIN CANCDER REMOVED FROM NOSE   . Chronic lower back pain   . Diabetes mellitus type 2, noninsulin dependent ([place]) 10/05/2017  . GERD (gastroesophageal reflux disease)   . Hidradenitis   . Hypercholesteremia   .  Hyperlipidemia 10/05/2017  . Hypertension   . Obesity (BMI 30-39.9) 10/05/2017  . Osteoarthritis (arthritis due to wear and tear of joints)    right knee  . Sleep apnea    STOP BANG SCORE 5  . Tobacco use 09/05/2011  . Type II diabetes mellitus ([place])     Past Surgical History:  Procedure Laterality Date  . CORONARY ANGIOPLASTY WITH STENT PLACEMENT  10/06/2017  . CORONARY STENT INTERVENTION N/A 10/06/2017   Procedure: CORONARY STENT INTERVENTION;  Surgeon: Burnell Blanks, MD;  Location: [facility];  Service: Cardiovascular;  Laterality: N/A;  . HERNIA REPAIR    . LEFT HEART CATH AND CORONARY ANGIOGRAPHY N/A 10/06/2017   Procedure: LEFT HEART CATH AND CORONARY ANGIOGRAPHY;  Surgeon: Burnell Blanks, MD;  Location: [facility];  Service: Cardiovascular;  Laterality: N/A;  . MULTIPLE TOOTH EXTRACTIONS  2017  . TONSILLECTOMY  1963  . VENTRAL HERNIA REPAIR  09/04/2011   Procedure: LAPAROSCOPIC VENTRAL HERNIA;  Surgeon: Gayland Curry, MD,FACS;  Location: [facility];  Service: General;  Laterality: N/A;  LAPAROSCOPIC incarcerated VENTRAL HERNIA  repair      Home Medications:  Prior to Admission medications   Medication Sig Start Date End Date Taking? Authorizing Provider  acetaminophen (TYLENOL) 500 MG tablet Take 1,000 mg by mouth every 8 (eight) hours as needed (pain).    Yes [provider]  aspirin EC 81 MG EC tablet Take 1 tablet (81 mg total) by mouth daily. 10/07/17  Yes Jacolyn Reedy, MD  atorvastatin (LIPITOR) 40 MG tablet Take 40 mg by mouth daily. 08/23/17  Yes [provider]  carvedilol (COREG) 12.5 MG tablet Take 1 tablet (12.5 mg total) by mouth 2 (two) times daily with a meal. 09/03/18 03/11/19 Yes Buford Dresser, MD  clopidogrel (PLAVIX) 75 MG tablet Take 2 tablets (150 mg) daily for the first 2 days then take 1 tablet (75 mg) daily. Patient taking differently: Take 75 mg by mouth daily. take 1 tablet (75 mg) daily. 05/03/18   Yes Richardo Priest, MD  CYMBALTA 30 MG capsule Take 60 mg by mouth daily.  10/29/11  Yes [provider]  ezetimibe (ZETIA) 10 MG tablet Take 1 tablet (10 mg total) by mouth daily. 09/20/18 03/11/19 Yes Buford Dresser, MD  gabapentin (NEURONTIN) 100 MG capsule Take 300 mg by mouth at bedtime.  11/02/18  Yes [provider]  glipiZIDE (GLUCOTROL XL) 5 MG 24 hr tablet Take 5 mg by mouth daily. 08/24/17  Yes [provider]  lisinopril-hydrochlorothiazide (PRINZIDE,ZESTORETIC) 20-25 MG per tablet Take 1 tablet by mouth daily.   Yes [provider]  metFORMIN (GLUCOPHAGE) 500 MG tablet Take 1,000 mg by mouth 2 (two) times daily with a meal.   Yes [provider]  nitroGLYCERIN (NITROSTAT) 0.4 MG SL tablet Place 1 tablet (0.4 mg total) under the tongue every 5 (five) minutes x 3 doses as needed for chest pain. 10/07/17  Yes Jacolyn Reedy, MD  omeprazole (PRILOSEC) 40 MG capsule Take 40 mg by mouth daily. 08/24/17  Yes [provider]  Vitamin D, Ergocalciferol, (DRISDOL) 50000 units CAPS capsule Take 50,000 Units by mouth every 7 (seven) days. Mondays 09/18/17  Yes [provider]    Inpatient Medications: Scheduled Meds: . aspirin EC  81 mg Oral Daily  . atorvastatin  40 mg Oral Daily  . carvedilol  12.5 mg Oral BID WC  . clopidogrel  75 mg Oral Daily  . dexamethasone (DECADRON) injection  6 mg Intravenous Daily  . DULoxetine  60 mg Oral Daily  . ezetimibe  10 mg Oral Daily  . gabapentin  300 mg Oral QHS  . insulin aspart  0-9 Units Subcutaneous TID WC  . lisinopril  20 mg Oral Daily  . pantoprazole  40 mg Oral Daily   Continuous Infusions: . heparin 1,200 Units/hr (03/12/19 0049)  . [START ON 03/13/2019] remdesivir 100 mg in NS 100 mL     PRN Meds: acetaminophen **OR** acetaminophen, nitroGLYCERIN, ondansetron **OR** ondansetron (ZOFRAN) IV  Allergies:   No Known Allergies  Social History:   Social History    Socioeconomic History  . Marital status: Widowed    Spouse name: Not on file  . Number of children: Not on file  . Years of education: Not on file  . Highest education level: Not on file  Occupational History  . Not on file  Tobacco Use  . Smoking status: Current Every Day Smoker    Packs/day: 0.50    Years: 42.00    Pack years: 21.00    Types: Cigarettes  . Smokeless tobacco: Never Used  Substance and Sexual Activity  . Alcohol use: No  . Drug use: No  . Sexual activity: Not on file  Other Topics Concern  . Not on file  Social History Narrative  . Not on file   Social Determinants of Health   Financial Resource Strain:   . Difficulty of Paying Living Expenses: Not on file  Food Insecurity:   . Worried About Charity fundraiser in the Last Year: Not on file  . Ran Out of Food in the Last Year: Not on file  Transportation Needs:   . Lack of Transportation (Medical): Not on file  . Lack of Transportation (Non-Medical): Not on file  Physical Activity:   . Days of Exercise per Week: Not on file  . Minutes of Exercise per Session: Not on file  Stress:   . Feeling of Stress : Not on file  Social Connections:   . Frequency of Communication with Friends and Family: Not on file  . Frequency of Social Gatherings with Friends and Family: Not on file  . Attends Religious Services: Not on file  . Active Member of Clubs or Organizations: Not on file  . Attends Archivist Meetings: Not on file  . Marital Status: Not on file  Intimate Partner Violence:   . Fear of Current or Ex-Partner: Not on file  . Emotionally Abused: Not on file  . Physically Abused: Not on file  . Sexually Abused: Not on file    Family History:   Family History  Problem Relation Age of Onset  . Dementia Mother 96  . CAD Father 66  . Cancer Sister 42       lung  . Lung cancer Brother 21  . Cancer Maternal Grandfather        colon  . Premature CHD Son   . Atrial fibrillation Sister   .  Atrial fibrillation Sister      ROS:  Please  see the history of present illness.  Review of Systems  Constitutional: Negative for fever.  HENT: Negative for congestion.   Respiratory: Positive for cough, sputum production and shortness of breath. Negative for hemoptysis.   Cardiovascular: Negative for chest pain, orthopnea, leg swelling and PND.  Gastrointestinal: Positive for nausea. Negative for blood in stool, diarrhea and vomiting.  Genitourinary: Negative for hematuria.  Musculoskeletal: Negative for falls.  Neurological: Negative for loss of consciousness. Dizziness: lightheadedness with standing.  Endo/Heme/Allergies: Does not bruise/bleed easily.  Psychiatric/Behavioral: Positive for substance abuse (recently quit smoking).    Physical Exam/Data:   Vitals:   03/12/19 0815 03/12/19 0830 03/12/19 0915 03/12/19 0921  BP: 105/60 (!) 116/56 (!) 109/58 (!) 109/58  Pulse: 60 66 (!) 59   Resp: 20 16 (!) 23   Temp:      TempSrc:      SpO2: 95% 94% 95%    No intake or output data in the 24 hours ending 03/12/19 0925 Last 3 Weights 11/25/2018 09/03/2018 01/26/2018  Weight (lbs) 211 lb 9.6 oz 215 lb 218 lb 12.8 oz  Weight (kg) 95.981 kg 97.523 kg 99.247 kg     There is no height or weight on file to calculate BMI.   Given patient is positive for COVID, I did not go into the patient's room but spoke with her on the phone. Therefore, physical exam limited.  General:  No acute distress. Cardiac: Mildly bradycardic at times per telemetry. Lungs: Patient able to speak in complete sentences without any noticeable shortness of breath. No active wheezing or cough. Neuro:  Alert and oriented x3. Able to answer questions appropriately. Psych:  Normal affect.  MD to follow.  EKG:  The EKG was personally reviewed and demonstrates: Normal sinus rhythm with rate of 73 bpm. Normal PR and QRS intervals. Normal Axis. No acute ST/T changes.  Telemetry:  Telemetry was personally reviewed and  demonstrates:  Sinus bradycardia/normal sinus rhythm with rates in the high 40's to 70's and multiple PVCs/PACs.  Relevant CV Studies:  Left Heart Catheterization 10/06/2017:  Prox RCA lesion is 30% stenosed.  Prox RCA to Dist RCA lesion is 10% stenosed.  Ost Cx lesion is 20% stenosed.  Prox Cx to Mid Cx lesion is 20% stenosed.  Ost 3rd Mrg lesion is 30% stenosed.  Prox LAD lesion is 99% stenosed.  A drug-eluting stent was successfully placed using a STENT SIERRA 3.50 X 15 MM.  Post intervention, there is a 0% residual stenosis.  The left ventricular systolic function is normal.  LV end diastolic pressure is normal.  The left ventricular ejection fraction is 55-65% by visual estimate.  There is no mitral valve regurgitation.   1. NSTEMI secondary to severe stenosis in the proximal LAD 2. Successful PTCA/DES x 1 proximal LAD 3. Mild non-obstructive disease in the RCA and Circumflex. 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  4. Normal LV systolic function.   Recommend uninterrupted dual antiplatelet therapy with Aspirin 81mg  daily and Ticagrelor 90mg  twice daily for a minimum of 12 months (ACS - Class I recommendation).  Laboratory Data:  High Sensitivity Troponin:   Recent Labs  Lab 03/11/19 2130 03/12/19 0343  TROPONINIHS 720* 2,107*     Chemistry Recent Labs  Lab 03/11/19 1755  NA 137  K 3.3*  CL 98  CO2 25  GLUCOSE 176*  BUN 14  CREATININE 0.70  CALCIUM 9.1  GFRNONAA >60  GFRAA >60  ANIONGAP 14    No results for  input(s): PROT, ALBUMIN, AST, ALT, ALKPHOS, BILITOT in the last 168 hours. Hematology Recent Labs  Lab 03/11/19 1755  WBC 9.8  RBC 4.52  HGB 11.8*  HCT 36.8  MCV 81.4  MCH 26.1  MCHC 32.1  RDW 14.7  PLT 496*   BNPNo results for input(s): BNP, PROBNP in the last 168 hours.  DDimer  Recent Labs  Lab 03/11/19 2130  DDIMER 1.50*     Radiology/Studies:  CT Angio Chest PE W and/or Wo Contrast  Result Date:  03/12/2019 CLINICAL DATA:  Shortness of breath.  COVID-19 positive 10 days ago. EXAM: CT ANGIOGRAPHY CHEST WITH CONTRAST TECHNIQUE: Multidetector CT imaging of the chest was performed using the standard protocol during bolus administration of intravenous contrast. Multiplanar CT image reconstructions and MIPs were obtained to evaluate the vascular anatomy. CONTRAST:  11mL OMNIPAQUE IOHEXOL 350 MG/ML SOLN COMPARISON:  Radiograph yesterday. Chest CT 10/07/2018 FINDINGS: Cardiovascular: There are no filling defects within the pulmonary arteries to suggest pulmonary embolus. Atherosclerosis of the thoracic aorta. No aortic aneurysm. Cannot assess for dissection given phase of contrast tailored to pulmonary artery evaluation. Heart is normal in size. No pericardial effusion. Mediastinum/Nodes: Small mediastinal and hilar lymph nodes, largest right paratracheal measuring 15 mm. Lymph nodes only slightly larger than on prior chest CT. No visualized thyroid nodule. Decompressed esophagus. Lungs/Pleura: Bilateral ground-glass and consolidative airspace opacities throughout both lungs, most prominent in the lower lobes. No significant pleural fluid. Trachea and mainstem bronchi are patent. Upper Abdomen: No acute findings. Musculoskeletal: There are no acute or suspicious osseous abnormalities. Review of the MIP images confirms the above findings. IMPRESSION: 1. No pulmonary embolus. 2. Bilateral ground-glass and consolidative airspace opacities throughout both lungs consistent with pneumonia, pattern typical of COVID-19. 3. Mild mediastinal and hilar adenopathy, chronic with nodes only slightly increased from prior chest CT, considered benign/reactive. Aortic Atherosclerosis (ICD10-I70.0). Electronically Signed   By: Keith Rake M.D.   On: 03/12/2019 04:19   DG Chest Portable 1 View  Result Date: 03/11/2019 CLINICAL DATA:  Viral pneumonia EXAM: PORTABLE CHEST 1 VIEW COMPARISON:  January 15, 2018 FINDINGS: There are  scattered pulmonary opacities bilaterally. There is some consolidation at the left lung base. The heart size is unchanged from prior study. There are aortic calcifications. There is no pneumothorax. No significant pleural effusion. IMPRESSION: Scattered bilateral pulmonary opacities consistent with the patient's history of viral pneumonia. Electronically Signed   By: Constance Holster M.D.   On: 03/11/2019 19:12       TIMI Risk Score for Unstable Angina or Non-ST Elevation MI:   The patient's TIMI risk score is 6, which indicates a 41% risk of all cause mortality, new or recurrent myocardial infarction or need for urgent revascularization in the next 14 days.   Assessment and Plan:   Elevated Troponin with Known CAD - Patient presented with shortness of breath and decreased O2 sats after being diagnosed with COVID 10 days ago. Patient denies any chest pain does report a 2 hour episode of squeezing right arm pain yesterday similar to prior NSTEMI in 09/2017. - EKG shows no acute ST/T changes.  - High-sensitivity troponin elevated at 720 >> 2,7107. - D-dimer elevated at 1.5. Chest CTA negative for PE but consistent with COVID pneumonia.  - Possibly demand ischemia secondary to COVID. However, CV risk factors include HTN, HLD, DM, obesity, and prior tobacco use. Patient also has known CAD so could be true NSTEMI. Will check Echo. Continue IV Heparin for now. Continue home Aspirin  and Plavix. Given patient is positive for COVID, do not suspect any invasive work-up at this time.   Hypertension - BP well controlled and actually on the softer side. Most recent BP 109/58.  - Home medications include Lisinopril-HCTZ 20-25mg  daily and Coreg 12.5mg  twice daily. These have been continued at this time. May need to decrease Coreg given mild bradycardia on telemetry.  Hyperlipidemia - Continue home Lipitor 40mg  daily and Zetia 10mg  daily.  Type 2 Diabetes Mellitus - Management per primary team.    Hypokalemia - Potassium 3.3 on admission. Repleted with K-Dur. - PVC/PACs noted on telemetry. Will check Magnesium as well.  - Continue to monitor.  Acute Respiratory Distress Secondary to COVID - Management per primary team.  For questions or updates, please contact [facility] Please consult [email or web address] for contact info under     Signed, Darreld Mclean, PA-C  03/12/2019 9:25 AM

## 2019-03-12 NOTE — H&P (Signed)
History and Physical    Kathryn Bruce [id number] DOB: 1945-11-05 DOA: 03/11/2019  PCP: Maurice Small, MD  Patient coming from: Home.  Chief Complaint: Shortness of breath.  HPI: Kathryn Bruce is a 74 y.o. female with known history of CAD status post stenting, diabetes mellitus type 2, hypertension presented to the ER because of increasing shortness of breath.  Patient was diagnosed with Covid infection about 10 days ago when patient family member also had Covid infection.  At that time patient had generalized body aches and flulike symptoms.  Over the last 2 days patient has been a increasing shortness of breath and was found to be D satting by the family.  Was brought to the ER.  Denies any chest pain has been having off-and-on diarrhea and vomiting.  ED Course: In the ER patient was hypoxic requiring 3 L oxygen chest x-ray showing bilateral infiltrates.  EKG shows normal sinus rhythm.  Labs show potassium 3.3 LDH 221 troponin VII 20 CRP 14.9 procalcitonin 1.23 hemoglobin 11.8 D-dimer 1.5.  ER physician discussed with on-call cardiologist Dr. Fransico Him about elevated troponin at this time cardiologist advised to get CT angiogram of the chest to rule out PE and felt that patient symptoms may be unlikely from the CAD.  Heparin was started.  Review of Systems: As per HPI, rest all negative.   Past Medical History:  Diagnosis Date  . Aortic atherosclerosis ([place]) 10/05/2017  . Arthritis    "spine, knees" (10/06/2017)  . CAD (coronary artery disease), native coronary artery 10/05/2017   10/06/2016 cardiac catheterization showing 99% LAD stenosis, 20 to 30% RCA and circumflex stenosis, 3.5 x 15 mm Sierra stent in proximal LAD by Dr. Angelena Form  . Cancer ([place])    SKIN CANCDER REMOVED FROM NOSE   . Chronic lower back pain   . Diabetes mellitus type 2, noninsulin dependent ([place]) 10/05/2017  . GERD (gastroesophageal reflux disease)   . Hidradenitis   . Hypercholesteremia   . Hyperlipidemia  10/05/2017  . Hypertension   . Obesity (BMI 30-39.9) 10/05/2017  . Osteoarthritis (arthritis due to wear and tear of joints)    right knee  . Sleep apnea    STOP BANG SCORE 5  . Tobacco use 09/05/2011  . Type II diabetes mellitus ([place])     Past Surgical History:  Procedure Laterality Date  . CORONARY ANGIOPLASTY WITH STENT PLACEMENT  10/06/2017  . CORONARY STENT INTERVENTION N/A 10/06/2017   Procedure: CORONARY STENT INTERVENTION;  Surgeon: Burnell Blanks, MD;  Location: [facility];  Service: Cardiovascular;  Laterality: N/A;  . HERNIA REPAIR    . LEFT HEART CATH AND CORONARY ANGIOGRAPHY N/A 10/06/2017   Procedure: LEFT HEART CATH AND CORONARY ANGIOGRAPHY;  Surgeon: Burnell Blanks, MD;  Location: [facility];  Service: Cardiovascular;  Laterality: N/A;  . MULTIPLE TOOTH EXTRACTIONS  2017  . TONSILLECTOMY  1963  . VENTRAL HERNIA REPAIR  09/04/2011   Procedure: LAPAROSCOPIC VENTRAL HERNIA;  Surgeon: Gayland Curry, MD,FACS;  Location: [facility];  Service: General;  Laterality: N/A;  LAPAROSCOPIC incarcerated VENTRAL HERNIA  repair      reports that she has been smoking cigarettes. She has a 21.00 pack-year smoking history. She has never used smokeless tobacco. She reports that she does not drink alcohol or use drugs.  No Known Allergies  Family History  Problem Relation Age of Onset  . Dementia Mother 67  . CAD Father 36  . Cancer Sister 61  lung  . Lung cancer Brother 59  . Cancer Maternal Grandfather        colon  . Premature CHD Son   . Atrial fibrillation Sister   . Atrial fibrillation Sister     Prior to Admission medications   Medication Sig Start Date End Date Taking? Authorizing Provider  acetaminophen (TYLENOL) 500 MG tablet Take 1,000 mg by mouth every 8 (eight) hours as needed (pain).    Yes [provider]  aspirin EC 81 MG EC tablet Take 1 tablet (81 mg total) by mouth daily. 10/07/17  Yes Jacolyn Reedy, MD   atorvastatin (LIPITOR) 40 MG tablet Take 40 mg by mouth daily. 08/23/17  Yes [provider]  carvedilol (COREG) 12.5 MG tablet Take 1 tablet (12.5 mg total) by mouth 2 (two) times daily with a meal. 09/03/18 03/11/19 Yes Buford Dresser, MD  clopidogrel (PLAVIX) 75 MG tablet Take 2 tablets (150 mg) daily for the first 2 days then take 1 tablet (75 mg) daily. Patient taking differently: Take 75 mg by mouth daily. take 1 tablet (75 mg) daily. 05/03/18  Yes Richardo Priest, MD  CYMBALTA 30 MG capsule Take 60 mg by mouth daily.  10/29/11  Yes [provider]  ezetimibe (ZETIA) 10 MG tablet Take 1 tablet (10 mg total) by mouth daily. 09/20/18 03/11/19 Yes Buford Dresser, MD  gabapentin (NEURONTIN) 100 MG capsule Take 300 mg by mouth at bedtime.  11/02/18  Yes [provider]  glipiZIDE (GLUCOTROL XL) 5 MG 24 hr tablet Take 5 mg by mouth daily. 08/24/17  Yes [provider]  lisinopril-hydrochlorothiazide (PRINZIDE,ZESTORETIC) 20-25 MG per tablet Take 1 tablet by mouth daily.   Yes [provider]  metFORMIN (GLUCOPHAGE) 500 MG tablet Take 1,000 mg by mouth 2 (two) times daily with a meal.   Yes [provider]  nitroGLYCERIN (NITROSTAT) 0.4 MG SL tablet Place 1 tablet (0.4 mg total) under the tongue every 5 (five) minutes x 3 doses as needed for chest pain. 10/07/17  Yes Jacolyn Reedy, MD  omeprazole (PRILOSEC) 40 MG capsule Take 40 mg by mouth daily. 08/24/17  Yes [provider]  Vitamin D, Ergocalciferol, (DRISDOL) 50000 units CAPS capsule Take 50,000 Units by mouth every 7 (seven) days. Mondays 09/18/17  Yes [provider]    Physical Exam: Constitutional: Moderately built and nourished. Vitals:   03/12/19 0215 03/12/19 0230 03/12/19 0245 03/12/19 0330  BP: 124/65 114/70 (!) 125/58 116/85  Pulse:  (!) 57 (!) 53 (!) 57  Resp: (!) 21 15 19 20   Temp:      TempSrc:      SpO2:  95% 95% 96%   Eyes: Anicteric no  pallor. ENMT: No discharge from the ears eyes nose or mouth. Neck: No mass felt.  No neck rigidity. Respiratory: No rhonchi or crepitations. Cardiovascular: S1-S2 heard. Abdomen: Soft nontender bowel sounds present. Musculoskeletal: No edema.  No joint effusion. Skin: No rash. Neurologic: Alert awake oriented time place and person.  Moves all extremities. Psychiatric: Appears normal per normal affect.   Labs on Admission: I have personally reviewed following labs and imaging studies  CBC: Recent Labs  Lab 03/11/19 1755  WBC 9.8  HGB 11.8*  HCT 36.8  MCV 81.4  PLT Q000111Q*   Basic Metabolic Panel: Recent Labs  Lab 03/11/19 1755  NA 137  K 3.3*  CL 98  CO2 25  GLUCOSE 176*  BUN 14  CREATININE 0.70  CALCIUM 9.1  GFR: CrCl cannot be calculated (Unknown ideal weight.). Liver Function Tests: No results for input(s): AST, ALT, ALKPHOS, BILITOT, PROT, ALBUMIN in the last 168 hours. No results for input(s): LIPASE, AMYLASE in the last 168 hours. No results for input(s): AMMONIA in the last 168 hours. Coagulation Profile: No results for input(s): INR, PROTIME in the last 168 hours. Cardiac Enzymes: No results for input(s): CKTOTAL, CKMB, CKMBINDEX, TROPONINI in the last 168 hours. BNP (last 3 results) No results for input(s): PROBNP in the last 8760 hours. HbA1C: No results for input(s): HGBA1C in the last 72 hours. CBG: No results for input(s): GLUCAP in the last 168 hours. Lipid Profile: Recent Labs    03/11/19 2204  TRIG 117   Thyroid Function Tests: No results for input(s): TSH, T4TOTAL, FREET4, T3FREE, THYROIDAB in the last 72 hours. Anemia Panel: Recent Labs    03/11/19 2130  FERRITIN 160   Urine analysis: No results found for: COLORURINE, APPEARANCEUR, LABSPEC, PHURINE, GLUCOSEU, HGBUR, BILIRUBINUR, KETONESUR, PROTEINUR, UROBILINOGEN, NITRITE, LEUKOCYTESUR Sepsis Labs: @LABRCNTIP (procalcitonin:4,lacticidven:4) )No results found for this or any previous  visit (from the past 240 hour(s)).   Radiological Exams on Admission: DG Chest Portable 1 View  Result Date: 03/11/2019 CLINICAL DATA:  Viral pneumonia EXAM: PORTABLE CHEST 1 VIEW COMPARISON:  January 15, 2018 FINDINGS: There are scattered pulmonary opacities bilaterally. There is some consolidation at the left lung base. The heart size is unchanged from prior study. There are aortic calcifications. There is no pneumothorax. No significant pleural effusion. IMPRESSION: Scattered bilateral pulmonary opacities consistent with the patient's history of viral pneumonia. Electronically Signed   By: Constance Holster M.D.   On: 03/11/2019 19:12    EKG: Independently reviewed.  Normal sinus rhythm.  Assessment/Plan Principal Problem:   Acute respiratory failure due to COVID-19 [facility]) Active Problems:   Type 2 diabetes mellitus with obesity (HCC)   CAD (coronary artery disease), native coronary artery   Acute respiratory failure with hypoxia ([place])    1. Acute respiratory failure with hypoxia secondary to COVID-19 infection for which I have placed patient on IV Decadron IV remdesivir.  Given hypoxia and elevated CRP I discussed with patient about using Actemra off label use and also discussed about his contraindications..  Patient is still not sure if she wants to use it.  Will discuss with my colleague in the morning after taking a decision. 2. Elevated troponin with history of CAD concerning for non-ST elevation MI for which ER physician has discussed with Dr. Fransico Him on-call cardiology who advised to get a CT angiogram.  Patient has been started on heparin patient is only on statins beta-blockers and antiplatelet agents.  Will trend cardiac markers. 3. Diabetes mellitus type 2 we will keep patient on sliding scale coverage note that patient is also on Decadron now. 4. Hypertension on Coreg lisinopril and hydrochlorothiazide.  Follow metabolic panel closely. 5. Normocytic normochromic anemia  follow CBC.  Given that patient has acute respiratory failure with hypoxia secondary to Covid infection with elevated troponin will need inpatient status.   DVT prophylaxis: Lovenox. Code Status: Full code. Family Communication: Unable to reach patient's son. Disposition Plan: Home. Consults called: Cardiology was notified. Admission status: Inpatient.   Rise Patience MD [facility] Pager [phone].  If 7PM-7AM, please contact night-coverage [email or web address] Password [facility]  03/12/2019, 3:43 AM

## 2019-03-12 NOTE — Progress Notes (Signed)
PROGRESS NOTE    Kathryn Bruce  [id number] DOB: 1946-01-14 DOA: 03/11/2019 PCP: Maurice Small, MD   Brief Narrative: 74 year old with past medical history significant for CAD status post stenting, diabetes type 2, hypertension presented to ER with increasing shortness of breath.  Patient was diagnosed with Covid infection 10 days prior to admission.  She reports generalized body aches and flulike symptoms.  Over the last 2 days prior to admission she developed increasing shortness of breath and was found to have low oxygen.  Patient in the ED patient was found to be hypoxic requiring 3 L of oxygen, chest x-ray showed bilateral infiltrates.  EKG normal sinus rhythm no ST elevation.  Troponin 720---2107.  Angio negative for PE.  Patient was a started on heparin drip, cardiology has been consulted.   Assessment & Plan:   Principal Problem:   Acute respiratory failure due to COVID-19 [facility]) Active Problems:   Type 2 diabetes mellitus with obesity (HCC)   CAD (coronary artery disease), native coronary artery   Acute respiratory failure with hypoxia (HCC)   1-Acute Hypoxic Respiratory Failure secondary to Covid 19 PNA;  -CT angio negative for PE, bilateral ground-glass opacities.  -Present with Dyspnea, New onset hypoxemia sat 80 %, currently on 3 L  -Continue with Remdesivir and Dexamethasone.  COVID-19 Labs  Recent Labs    03/11/19 2130 03/12/19 0849  DDIMER 1.50*  --   FERRITIN 160 148  LDH 221*  --   CRP 14.9* 12.0*    Lab Results  Component Value Date   SARSCOV2NAA POSITIVE (A) 03/12/2019   [place] Not Detected 12/20/2018    2-NSTEMI; Vs [place] ischemia in setting of covid PNA Troponin 2000, range, EKG no ST elevation.  Continue with Heparin Gtt. for 48-hour Continue with statins, BB, aspirin, Plavix.  Cardiology recommend conservative management, unless patient developed chest pain or EKG changes during this admission.  Patient will need outpatient follow-up for  stress test versus cath.  3-Hypokalema;  Replete orally.  Hypomagnesemia; replete IV.   4-Diabetes Mellitus type 2;  Continue with SSI.  Hold glipizide while inpatient to avoid hypoglycemia. Marland Kitchen   5-HTN; continue with carvedilol and lisinopril.  Hold hydrochlorothiazide  6-Normocytic anemia;  Low hemoglobin trend. Check anemia panel.   Estimated body mass index is 35.21 kg/m as calculated from the following:   Height as of 11/25/18: 5\' 5"  (1.651 m).   Weight as of 11/25/18: 96 kg.   DVT prophylaxis: Heparin drip Code Status: Full code Family Communication: Discussed with patient Disposition Plan: Remain in the hospital for treatment of COVID-19 pneumonia and non-STEMI Consultants:   Cardiology  Procedures:   None  Antimicrobials: Others Remdesivir  Subjective: Patient is alert and oriented x3.  She denies chest pain.  She reports shortness of breath.  She report cough.  Objective: Vitals:   03/12/19 0245 03/12/19 0330 03/12/19 0415 03/12/19 0729  BP: (!) 125/58 116/85 (!) 121/54 125/60  Pulse: (!) 53 (!) 57 (!) 58 61  Resp: 19 20  (!) 24  Temp:      TempSrc:      SpO2: 95% 96% 93% 90%   No intake or output data in the 24 hours ending 03/12/19 0800 There were no vitals filed for this visit.  Examination:  General exam: Appears calm and comfortable  Respiratory system: Bilateral rhonchorous respiratory effort normal. Cardiovascular system: S1 & S2 heard, RRR. No JVD, murmurs, rubs, gallops or clicks. No pedal edema. Gastrointestinal system: Abdomen is nondistended, soft and  nontender. No organomegaly or masses felt. Normal bowel sounds heard. Central nervous system: Alert and oriented. No focal neurological deficits. Extremities: Symmetric 5 x 5 power. Skin: No rashes, lesions or ulcers Psychiatry: Judgement and insight appear normal. Mood & affect appropriate.     Data Reviewed: I have personally reviewed following labs and imaging studies  CBC: Recent  Labs  Lab 03/11/19 1755  WBC 9.8  HGB 11.8*  HCT 36.8  MCV 81.4  PLT Q000111Q*   Basic Metabolic Panel: Recent Labs  Lab 03/11/19 1755  NA 137  K 3.3*  CL 98  CO2 25  GLUCOSE 176*  BUN 14  CREATININE 0.70  CALCIUM 9.1   GFR: CrCl cannot be calculated (Unknown ideal weight.). Liver Function Tests: No results for input(s): AST, ALT, ALKPHOS, BILITOT, PROT, ALBUMIN in the last 168 hours. No results for input(s): LIPASE, AMYLASE in the last 168 hours. No results for input(s): AMMONIA in the last 168 hours. Coagulation Profile: No results for input(s): INR, PROTIME in the last 168 hours. Cardiac Enzymes: No results for input(s): CKTOTAL, CKMB, CKMBINDEX, TROPONINI in the last 168 hours. BNP (last 3 results) No results for input(s): PROBNP in the last 8760 hours. HbA1C: No results for input(s): HGBA1C in the last 72 hours. CBG: No results for input(s): GLUCAP in the last 168 hours. Lipid Profile: Recent Labs    03/11/19 2204  TRIG 117   Thyroid Function Tests: No results for input(s): TSH, T4TOTAL, FREET4, T3FREE, THYROIDAB in the last 72 hours. Anemia Panel: Recent Labs    03/11/19 2130  FERRITIN 160   Sepsis Labs: Recent Labs  Lab 03/11/19 2130  PROCALCITON 0.23  LATICACIDVEN 1.6    Recent Results (from the past 240 hour(s))  Respiratory Panel by RT PCR (Flu A&B, Covid) - Nasopharyngeal Swab     Status: Abnormal   Collection Time: 03/12/19  2:42 AM   Specimen: Nasopharyngeal Swab  Result Value Ref Range Status   SARS Coronavirus 2 by RT PCR POSITIVE (A) NEGATIVE Final    Comment: RESULT CALLED TO, READ BACK BY AND VERIFIED WITH: Cindy Hazy RN 03/12/19 0338 JDW (NOTE) SARS-CoV-2 target nucleic acids are DETECTED. SARS-CoV-2 RNA is generally detectable in upper respiratory specimens  during the acute phase of infection. Positive results are indicative of the presence of the identified virus, but do not rule out bacterial infection or co-infection with other  pathogens not detected by the test. Clinical correlation with patient history and other diagnostic information is necessary to determine patient infection status. The expected result is Negative. Fact Sheet for Patients:  PinkCheek.be Fact Sheet for Healthcare Providers: GravelBags.it This test is not yet approved or cleared by the [place] FDA and  has been authorized for detection and/or diagnosis of SARS-CoV-2 by FDA under an Emergency Use Authorization (EUA).  This EUA will remain in effect (meaning this test can be used) for the  duration of  the COVID-19 declaration under Section 564(b)(1) of the Act, 21 U.S.C. section 360bbb-3(b)(1), unless the authorization is terminated or revoked sooner.    Influenza A by PCR NEGATIVE NEGATIVE Final   Influenza B by PCR NEGATIVE NEGATIVE Final    Comment: (NOTE) The Xpert Xpress SARS-CoV-2/FLU/RSV assay is intended as an aid in  the diagnosis of influenza from Nasopharyngeal swab specimens and  should not be used as a sole basis for treatment. Nasal washings and  aspirates are unacceptable for Xpert Xpress SARS-CoV-2/FLU/RSV  testing. Fact Sheet for Patients: PinkCheek.be Fact Sheet for  Healthcare Providers: GravelBags.it This test is not yet approved or cleared by the [place] and  has been authorized for detection and/or diagnosis of SARS-CoV-2 by  FDA under an Emergency Use Authorization (EUA). This EUA will remain  in effect (meaning this test can be used) for the duration of the  Covid-19 declaration under Section 564(b)(1) of the Act, 21  U.S.C. section 360bbb-3(b)(1), unless the authorization is  terminated or revoked. Performed at [facility], [address], [place], [place] [address]          Radiology Studies: CT Angio Chest PE W and/or Wo Contrast  Result Date: 03/12/2019 CLINICAL  DATA:  Shortness of breath.  COVID-19 positive 10 days ago. EXAM: CT ANGIOGRAPHY CHEST WITH CONTRAST TECHNIQUE: Multidetector CT imaging of the chest was performed using the standard protocol during bolus administration of intravenous contrast. Multiplanar CT image reconstructions and MIPs were obtained to evaluate the vascular anatomy. CONTRAST:  125mL OMNIPAQUE IOHEXOL 350 MG/ML SOLN COMPARISON:  Radiograph yesterday. Chest CT 10/07/2018 FINDINGS: Cardiovascular: There are no filling defects within the pulmonary arteries to suggest pulmonary embolus. Atherosclerosis of the thoracic aorta. No aortic aneurysm. Cannot assess for dissection given phase of contrast tailored to pulmonary artery evaluation. Heart is normal in size. No pericardial effusion. Mediastinum/Nodes: Small mediastinal and hilar lymph nodes, largest right paratracheal measuring 15 mm. Lymph nodes only slightly larger than on prior chest CT. No visualized thyroid nodule. Decompressed esophagus. Lungs/Pleura: Bilateral ground-glass and consolidative airspace opacities throughout both lungs, most prominent in the lower lobes. No significant pleural fluid. Trachea and mainstem bronchi are patent. Upper Abdomen: No acute findings. Musculoskeletal: There are no acute or suspicious osseous abnormalities. Review of the MIP images confirms the above findings. IMPRESSION: 1. No pulmonary embolus. 2. Bilateral ground-glass and consolidative airspace opacities throughout both lungs consistent with pneumonia, pattern typical of COVID-19. 3. Mild mediastinal and hilar adenopathy, chronic with nodes only slightly increased from prior chest CT, considered benign/reactive. Aortic Atherosclerosis (ICD10-I70.0). Electronically Signed   By: Keith Rake M.D.   On: 03/12/2019 04:19   DG Chest Portable 1 View  Result Date: 03/11/2019 CLINICAL DATA:  Viral pneumonia EXAM: PORTABLE CHEST 1 VIEW COMPARISON:  January 15, 2018 FINDINGS: There are scattered pulmonary  opacities bilaterally. There is some consolidation at the left lung base. The heart size is unchanged from prior study. There are aortic calcifications. There is no pneumothorax. No significant pleural effusion. IMPRESSION: Scattered bilateral pulmonary opacities consistent with the patient's history of viral pneumonia. Electronically Signed   By: Constance Holster M.D.   On: 03/11/2019 19:12        Scheduled Meds:  aspirin EC  81 mg Oral Daily   atorvastatin  40 mg Oral Daily   carvedilol  12.5 mg Oral BID WC   clopidogrel  75 mg Oral Daily   dexamethasone (DECADRON) injection  6 mg Intravenous Daily   DULoxetine  60 mg Oral Daily   ezetimibe  10 mg Oral Daily   gabapentin  300 mg Oral QHS   hydrochlorothiazide  25 mg Oral Daily   insulin aspart  0-9 Units Subcutaneous TID WC   lisinopril  20 mg Oral Daily   pantoprazole  40 mg Oral Daily   Continuous Infusions:  heparin 1,200 Units/hr (03/12/19 0049)   remdesivir 200 mg in sodium chloride 0.9% 250 mL IVPB 200 mg (03/12/19 0745)   Followed by   Derrill Memo ON 03/13/2019] remdesivir 100 mg in NS 100 mL  LOS: 0 days    Time spent: 35 minutes.     Elmarie Shiley, MD [facility]   If 7PM-7AM, please contact night-coverage [email or web address] Password [email or web address] 03/12/2019, 8:00 AM

## 2019-03-12 NOTE — ED Notes (Signed)
MD notified of Tropin levles

## 2019-03-12 NOTE — ED Notes (Signed)
Pt SpO2 90% on 2 liters. Tritiated to 3 liters with SpO2 now 94%

## 2019-03-13 ENCOUNTER — Inpatient Hospital Stay (HOSPITAL_COMMUNITY): Payer: Medicare Other

## 2019-03-13 DIAGNOSIS — R7989 Other specified abnormal findings of blood chemistry: Secondary | ICD-10-CM

## 2019-03-13 LAB — URINALYSIS, ROUTINE W REFLEX MICROSCOPIC
Bilirubin Urine: NEGATIVE
Glucose, UA: 50 mg/dL — AB
Hgb urine dipstick: NEGATIVE
Ketones, ur: NEGATIVE mg/dL
Nitrite: NEGATIVE
Protein, ur: NEGATIVE mg/dL
Specific Gravity, Urine: 1.025 (ref 1.005–1.030)
pH: 5 (ref 5.0–8.0)

## 2019-03-13 LAB — ECHOCARDIOGRAM LIMITED
Height: 65 in
Weight: 3266.34 oz

## 2019-03-13 LAB — COMPREHENSIVE METABOLIC PANEL
ALT: 19 U/L (ref 0–44)
AST: 18 U/L (ref 15–41)
Albumin: 2.4 g/dL — ABNORMAL LOW (ref 3.5–5.0)
Alkaline Phosphatase: 55 U/L (ref 38–126)
Anion gap: 12 (ref 5–15)
BUN: 15 mg/dL (ref 8–23)
CO2: 26 mmol/L (ref 22–32)
Calcium: 8.6 mg/dL — ABNORMAL LOW (ref 8.9–10.3)
Chloride: 103 mmol/L (ref 98–111)
Creatinine, Ser: 0.64 mg/dL (ref 0.44–1.00)
GFR calc Af Amer: >60 mL/min (ref >60)
GFR calc non Af Amer: >60 mL/min (ref >60)
Glucose, Bld: 131 mg/dL — ABNORMAL HIGH (ref 70–99)
Potassium: 3.5 mmol/L (ref 3.5–5.1)
Sodium: 141 mmol/L (ref 135–145)
Total Bilirubin: 0.4 mg/dL (ref 0.3–1.2)
Total Protein: 6.5 g/dL (ref 6.5–8.1)

## 2019-03-13 LAB — CBC WITH DIFFERENTIAL/PLATELET
Abs Immature Granulocytes: 0.15 10*3/uL — ABNORMAL HIGH (ref 0.00–0.07)
Basophils Absolute: 0 10*3/uL (ref 0.0–0.1)
Basophils Relative: 0 %
Eosinophils Absolute: 0 10*3/uL (ref 0.0–0.5)
Eosinophils Relative: 0 %
HCT: 35.9 % — ABNORMAL LOW (ref 36.0–46.0)
Hemoglobin: 11.6 g/dL — ABNORMAL LOW (ref 12.0–15.0)
Immature Granulocytes: 1 %
Lymphocytes Relative: 24 %
Lymphs Abs: 2.6 10*3/uL (ref 0.7–4.0)
MCH: 26.2 pg (ref 26.0–34.0)
MCHC: 32.3 g/dL (ref 30.0–36.0)
MCV: 81 fL (ref 80.0–100.0)
Monocytes Absolute: 0.8 10*3/uL (ref 0.1–1.0)
Monocytes Relative: 7 %
Neutro Abs: 7.1 10*3/uL (ref 1.7–7.7)
Neutrophils Relative %: 68 %
Platelets: 521 10*3/uL — ABNORMAL HIGH (ref 150–400)
RBC: 4.43 MIL/uL (ref 3.87–5.11)
RDW: 14.5 % (ref 11.5–15.5)
WBC: 10.7 10*3/uL — ABNORMAL HIGH (ref 4.0–10.5)
nRBC: 0 % (ref 0.0–0.2)

## 2019-03-13 LAB — TROPONIN I (HIGH SENSITIVITY): Troponin I (High Sensitivity): 564 ng/L (ref <18)

## 2019-03-13 LAB — HEPARIN LEVEL (UNFRACTIONATED): Heparin Unfractionated: 0.41 IU/mL (ref 0.30–0.70)

## 2019-03-13 LAB — C-REACTIVE PROTEIN: CRP: 8.5 mg/dL — ABNORMAL HIGH (ref <1.0)

## 2019-03-13 LAB — ABO/RH: ABO/RH(D): O POS

## 2019-03-13 LAB — FERRITIN: Ferritin: 173 ng/mL (ref 11–307)

## 2019-03-13 MED ORDER — SODIUM CHLORIDE 0.9 % IV SOLN
1.0000 g | INTRAVENOUS | Status: DC
  Administered 2019-03-13 – 2019-03-15 (×3): 1 g via INTRAVENOUS
  Filled 2019-03-13: qty 1
  Filled 2019-03-13 (×2): qty 10
  Filled 2019-03-13: qty 1

## 2019-03-13 MED ORDER — POTASSIUM CHLORIDE CRYS ER 20 MEQ PO TBCR
20.0000 meq | EXTENDED_RELEASE_TABLET | Freq: Once | ORAL | Status: AC
  Administered 2019-03-13: 20 meq via ORAL
  Filled 2019-03-13: qty 1

## 2019-03-13 MED ORDER — MAGNESIUM OXIDE 400 (241.3 MG) MG PO TABS
200.0000 mg | ORAL_TABLET | Freq: Two times a day (BID) | ORAL | Status: DC
  Administered 2019-03-13 – 2019-03-16 (×7): 200 mg via ORAL
  Filled 2019-03-13 (×7): qty 1

## 2019-03-13 NOTE — Progress Notes (Signed)
PROGRESS NOTE    Kathryn Bruce  [id number] DOB: [birth year] DOA: 03/11/2019 PCP: Maurice Small, MD   Brief Narrative: 74 year old with past medical history significant for CAD status post stenting, diabetes type 2, hypertension presented to ER with increasing shortness of breath.  Patient was diagnosed with Covid infection 10 days prior to admission.  She reports generalized body aches and flulike symptoms.  Over the last 2 days prior to admission she developed increasing shortness of breath and was found to have low oxygen.  Patient in the ED patient was found to be hypoxic requiring 3 L of oxygen, chest x-ray showed bilateral infiltrates.  EKG normal sinus rhythm no ST elevation.  Troponin 720---2107.  Angio negative for PE.  Patient was a started on heparin drip, cardiology has been consulted.   Assessment & Plan:   Principal Problem:   Acute respiratory failure due to COVID-19 [facility]) Active Problems:   Type 2 diabetes mellitus with obesity (HCC)   CAD (coronary artery disease), native coronary artery   Acute respiratory failure with hypoxia (HCC)   1-Acute Hypoxic Respiratory Failure secondary to Covid 19 PNA;  -CT angio negative for PE, bilateral ground-glass opacities.  -Present with Dyspnea, New onset hypoxemia sat 80 %, currently on 3 L  -Continue with Remdesivir and Dexamethasone. Day 2.  -Inflammatory Markers trending down, stable.  COVID-19 Labs  Recent Labs    03/11/19 2130 03/12/19 0849 03/13/19 0432  DDIMER 1.50*  --   --   FERRITIN 160 148 173  LDH 221*  --   --   CRP 14.9* 12.0* 8.5*    Lab Results  Component Value Date   SARSCOV2NAA POSITIVE (A) 03/12/2019   [place] Not Detected 12/20/2018    2-NSTEMI; Vs [place]and ischemia in setting of covid PNA Troponin 2000, range, EKG no ST elevation.  Continue with Heparin Gtt. for 48-hour Continue with statins, BB, aspirin, Plavix.  Cardiology recommend conservative management, unless patient developed chest  pain or EKG changes during this admission.  Patient will need outpatient follow-up for stress test versus cath. ECHO; Pending  3-Hypokalema;  Improved. Will give 20 meq times one Hypomagnesemia; replaced  IV. Start oral supplement. Repeat labs in am.   4-Diabetes Mellitus type 2;  Continue with SSI.  Hold glipizide while inpatient to avoid hypoglycemia. Marland Kitchen   5-HTN; continue with carvedilol and lisinopril.  Hold hydrochlorothiazide  6-Normocytic anemia;  Low hemoglobin trend. Check anemia panel.  Urine abnormal color;  Check UA  Estimated body mass index is 33.97 kg/m as calculated from the following:   Height as of this encounter: 5\' 5"  (1.651 m).   Weight as of this encounter: 92.6 kg.   DVT prophylaxis: Heparin drip Code Status: Full code Family Communication: Discussed with patient Disposition Plan: Remain in the hospital for treatment of COVID-19 pneumonia and non-STEMI Consultants:   Cardiology  Procedures:   None  Antimicrobials: Others Remdesivir  Subjective: She denies chest pain. She is feeling ok. Denies worsening dyspnea.   Objective: Vitals:   03/13/19 0230 03/13/19 0245 03/13/19 0421 03/13/19 1057  BP: 130/74 (!) 123/92 116/75 (!) 105/49  Pulse: (!) 48 (!) 55 (!) 57   Resp: 17 14 16 17   Temp:   97.7 F (36.5 C) (!) 97.5 F (36.4 C)  TempSrc:   Oral Oral  SpO2: 95% 94% 93% 98%  Weight:   92.6 kg   Height:   5\' 5"  (1.651 m)     Intake/Output Summary (Last 24 hours) at 03/13/2019  1226 Last data filed at 03/13/2019 0445 Gross per 24 hour  Intake 355.09 ml  Output -  Net 355.09 ml   Filed Weights   03/13/19 0421  Weight: 92.6 kg    Examination:  General exam: NAD Respiratory system: CTA Cardiovascular system: S 1, S 2 RRR Gastrointestinal system: BS present, soft, nt Central nervous system: non focal.  Extremities: Symmetric power.  Skin: No rashes   Data Reviewed: I have personally reviewed following labs and imaging studies   CBC: Recent Labs  Lab 03/11/19 1755 03/12/19 0849 03/13/19 0432  WBC 9.8 8.2 10.7*  NEUTROABS  --  5.0 7.1  HGB 11.8* 10.9* 11.6*  HCT 36.8 34.9* 35.9*  MCV 81.4 82.5 81.0  PLT 496* 422* [id number]*   Basic Metabolic Panel: Recent Labs  Lab 03/11/19 1755 03/12/19 0849 03/12/19 0950 03/13/19 0432  NA 137 138  --  141  K 3.3* 3.2*  --  3.5  CL 98 98  --  103  CO2 25 27  --  26  GLUCOSE 176* 177*  --  131*  BUN 14 13  --  15  CREATININE 0.70 0.71  --  0.64  CALCIUM 9.1 8.5*  --  8.6*  MG  --   --  1.2*  --    GFR: Estimated Creatinine Clearance: 69.3 mL/min (by C-G formula based on SCr of 0.64 mg/dL). Liver Function Tests: Recent Labs  Lab 03/12/19 0849 03/13/19 0432  AST 21 18  ALT 19 19  ALKPHOS 50 55  BILITOT 0.7 0.4  PROT 6.3* 6.5  ALBUMIN 2.4* 2.4*   No results for input(s): LIPASE, AMYLASE in the last 168 hours. No results for input(s): AMMONIA in the last 168 hours. Coagulation Profile: No results for input(s): INR, PROTIME in the last 168 hours. Cardiac Enzymes: No results for input(s): CKTOTAL, CKMB, CKMBINDEX, TROPONINI in the last 168 hours. BNP (last 3 results) No results for input(s): PROBNP in the last 8760 hours. HbA1C: No results for input(s): HGBA1C in the last 72 hours. CBG: Recent Labs  Lab 03/12/19 0846 03/12/19 1152 03/12/19 1708 03/12/19 2159  GLUCAP 168* 171* 295* 308*   Lipid Profile: Recent Labs    03/11/19 2204  TRIG 117   Thyroid Function Tests: No results for input(s): TSH, T4TOTAL, FREET4, T3FREE, THYROIDAB in the last 72 hours. Anemia Panel: Recent Labs    03/12/19 0849 03/13/19 0432  FERRITIN 148 173   Sepsis Labs: Recent Labs  Lab 03/11/19 2130 03/12/19 0849  PROCALCITON 0.23 <0.10  LATICACIDVEN 1.6  --     Recent Results (from the past 240 hour(s))  Blood Culture (routine x 2)     Status: None (Preliminary result)   Collection Time: 03/11/19  9:34 PM   Specimen: BLOOD RIGHT FOREARM  Result Value Ref  Range Status   Specimen Description BLOOD RIGHT FOREARM  Final   Special Requests   Final    BOTTLES DRAWN AEROBIC AND ANAEROBIC Blood Culture adequate volume   Culture   Final    NO GROWTH 2 DAYS Performed at [facility], [place] [address], [place],  [address]    Report Status PENDING  Incomplete  Respiratory Panel by RT PCR (Flu A&B, Covid) - Nasopharyngeal Swab     Status: Abnormal   Collection Time: 03/12/19  2:42 AM   Specimen: Nasopharyngeal Swab  Result Value Ref Range Status   SARS Coronavirus 2 by RT PCR POSITIVE (A) NEGATIVE Final    Comment: RESULT  CALLED TO, READ BACK BY AND VERIFIED WITH: Cindy Hazy RN 03/12/19 0338 JDW (NOTE) SARS-CoV-2 target nucleic acids are DETECTED. SARS-CoV-2 RNA is generally detectable in upper respiratory specimens  during the acute phase of infection. Positive results are indicative of the presence of the identified virus, but do not rule out bacterial infection or co-infection with other pathogens not detected by the test. Clinical correlation with patient history and other diagnostic information is necessary to determine patient infection status. The expected result is Negative. Fact Sheet for Patients:  PinkCheek.be Fact Sheet for Healthcare Providers: GravelBags.it This test is not yet approved or cleared by the [place] FDA and  has been authorized for detection and/or diagnosis of SARS-CoV-2 by FDA under an Emergency Use Authorization (EUA).  This EUA will remain in effect (meaning this test can be used) for the  duration of  the COVID-19 declaration under Section 564(b)(1) of the Act, 21 U.S.C. section 360bbb-3(b)(1), unless the authorization is terminated or revoked sooner.    Influenza A by PCR NEGATIVE NEGATIVE Final   Influenza B by PCR NEGATIVE NEGATIVE Final    Comment: (NOTE) The Xpert Xpress SARS-CoV-2/FLU/RSV assay is intended as an aid in  the  diagnosis of influenza from Nasopharyngeal swab specimens and  should not be used as a sole basis for treatment. Nasal washings and  aspirates are unacceptable for Xpert Xpress SARS-CoV-2/FLU/RSV  testing. Fact Sheet for Patients: PinkCheek.be Fact Sheet for Healthcare Providers: GravelBags.it This test is not yet approved or cleared by the [place] FDA and  has been authorized for detection and/or diagnosis of SARS-CoV-2 by  FDA under an Emergency Use Authorization (EUA). This EUA will remain  in effect (meaning this test can be used) for the duration of the  Covid-19 declaration under Section 564(b)(1) of the Act, 21  U.S.C. section 360bbb-3(b)(1), unless the authorization is  terminated or revoked. Performed at [facility], [place] [address], [place], [place] [address]   Blood Culture (routine x 2)     Status: None (Preliminary result)   Collection Time: 03/12/19  8:35 AM   Specimen: BLOOD  Result Value Ref Range Status   Specimen Description BLOOD LEFT ANTECUBITAL  Final   Special Requests   Final    BOTTLES DRAWN AEROBIC AND ANAEROBIC Blood Culture results may not be optimal due to an inadequate volume of blood received in culture bottles   Culture   Final    NO GROWTH < 24 HOURS Performed at [facility], [place] [address] [place], [place] [address]    Report Status PENDING  Incomplete         Radiology Studies: CT Angio Chest PE W and/or Wo Contrast  Result Date: 03/12/2019 CLINICAL DATA:  Shortness of breath.  COVID-19 positive 10 days ago. EXAM: CT ANGIOGRAPHY CHEST WITH CONTRAST TECHNIQUE: Multidetector CT imaging of the chest was performed using the standard protocol during bolus administration of intravenous contrast. Multiplanar CT image reconstructions and MIPs were obtained to evaluate the vascular anatomy. CONTRAST:  184mL OMNIPAQUE IOHEXOL 350 MG/ML SOLN COMPARISON:  Radiograph yesterday.  Chest CT 10/07/2018 FINDINGS: Cardiovascular: There are no filling defects within the pulmonary arteries to suggest pulmonary embolus. Atherosclerosis of the thoracic aorta. No aortic aneurysm. Cannot assess for dissection given phase of contrast tailored to pulmonary artery evaluation. Heart is normal in size. No pericardial effusion. Mediastinum/Nodes: Small mediastinal and hilar lymph nodes, largest right paratracheal measuring 15 mm. Lymph nodes only slightly larger than on prior  chest CT. No visualized thyroid nodule. Decompressed esophagus. Lungs/Pleura: Bilateral ground-glass and consolidative airspace opacities throughout both lungs, most prominent in the lower lobes. No significant pleural fluid. Trachea and mainstem bronchi are patent. Upper Abdomen: No acute findings. Musculoskeletal: There are no acute or suspicious osseous abnormalities. Review of the MIP images confirms the above findings. IMPRESSION: 1. No pulmonary embolus. 2. Bilateral ground-glass and consolidative airspace opacities throughout both lungs consistent with pneumonia, pattern typical of COVID-19. 3. Mild mediastinal and hilar adenopathy, chronic with nodes only slightly increased from prior chest CT, considered benign/reactive. Aortic Atherosclerosis (ICD10-I70.0). Electronically Signed   By: Keith Rake M.D.   On: 03/12/2019 04:19   DG Chest Portable 1 View  Result Date: 03/11/2019 CLINICAL DATA:  Viral pneumonia EXAM: PORTABLE CHEST 1 VIEW COMPARISON:  January 15, 2018 FINDINGS: There are scattered pulmonary opacities bilaterally. There is some consolidation at the left lung base. The heart size is unchanged from prior study. There are aortic calcifications. There is no pneumothorax. No significant pleural effusion. IMPRESSION: Scattered bilateral pulmonary opacities consistent with the patient's history of viral pneumonia. Electronically Signed   By: Constance Holster M.D.   On: 03/11/2019 19:12        Scheduled  Meds: . aspirin EC  81 mg Oral Daily  . atorvastatin  40 mg Oral Daily  . carvedilol  12.5 mg Oral BID WC  . clopidogrel  75 mg Oral Daily  . dexamethasone (DECADRON) injection  6 mg Intravenous Daily  . DULoxetine  60 mg Oral Daily  . ezetimibe  10 mg Oral Daily  . gabapentin  300 mg Oral QHS  . insulin aspart  0-9 Units Subcutaneous TID WC  . lisinopril  20 mg Oral Daily  . magnesium oxide  200 mg Oral BID  . pantoprazole  40 mg Oral Daily   Continuous Infusions: . heparin 1,200 Units/hr (03/12/19 2008)  . remdesivir 100 mg in NS 100 mL 100 mg (03/13/19 1203)     LOS: 1 day    Time spent: 35 minutes.     Elmarie Shiley, MD [facility]   If 7PM-7AM, please contact night-coverage [email or web address] Password [facility] 03/13/2019, 12:26 PM

## 2019-03-13 NOTE — Progress Notes (Signed)
   Cardiology consulted for elevated hs-troponin in the setting of COVID pneumonia.  Echocardiogram pending.  [name] remains hemodynamically stable.  Plan for 48 hours of heparin and outpatient ischemia evaluation unless echo shows new wall motion abnormalities.  Cardiology will comment after the echo has been completed.  No new recommendations at this time.   Kathryn Bruce C. Oval Linsey, MD, [facility] 03/13/2019 11:53 AM

## 2019-03-13 NOTE — Progress Notes (Signed)
Telemetry called multiple times about monitor not being picked up. New stickers multiple times. New wires multiple times. AM RN changed tele set. Pt is now on MX40-06 and is showing on the monitor

## 2019-03-13 NOTE — Progress Notes (Signed)
  Echocardiogram 2D Echocardiogram has been performed.  Kathryn Bruce 03/13/2019, 4:44 PM

## 2019-03-13 NOTE — ED Notes (Signed)
Report attempted 

## 2019-03-13 NOTE — Progress Notes (Signed)
ANTICOAGULATION CONSULT NOTE  Pharmacy Consult for Heparin Indication: chest pain/ACS  No Known Allergies  Patient Measurements: Height: 5\' 5"  (165.1 cm) Weight: 204 lb 2.3 oz (92.6 kg) IBW/kg (Calculated) : 57 Heparin Dosing Weight: 80 kg  Vital Signs: Temp: 97.7 F (36.5 C) (01/03 0421) Temp Source: Oral (01/03 0421) BP: 116/75 (01/03 0421) Pulse Rate: 57 (01/03 0421)  Labs: Recent Labs    03/11/19 1755 03/12/19 0849 03/12/19 0950 03/13/19 0432  HGB 11.8* 10.9*  --  11.6*  HCT 36.8 34.9*  --  35.9*  PLT 496* 422*  --  521*  HEPARINUNFRC  --   --  0.34 0.41  CREATININE 0.70 0.71  --  0.64  TROPONINIHS  --  1,683* 1,548* 564*    Estimated Creatinine Clearance: 69.3 mL/min (by C-G formula based on SCr of 0.64 mg/dL).  Assessment: 74 y.o. female with SOB and elevated cardiac markers, COVD+ continues on IV heparin for r/o ACS. Heparin level is therapeutic. CBC stable. No bleeding noted.   Goal of Therapy:  Heparin level 0.3-0.7 units/ml Monitor platelets by anticoagulation protocol: Yes   Plan:  Continue heparin gtt 1200 units/hr Daily heparin level and CBC  Berenice Bouton, PharmD PGY1 Pharmacy Resident  Please check AMION for all [place] phone numbers After 10:00 PM, call [place] [phone] 03/13/2019 7:07 AM

## 2019-03-14 DIAGNOSIS — R778 Other specified abnormalities of plasma proteins: Secondary | ICD-10-CM

## 2019-03-14 LAB — CBC
HCT: 32.4 % — ABNORMAL LOW (ref 36.0–46.0)
Hemoglobin: 10.2 g/dL — ABNORMAL LOW (ref 12.0–15.0)
MCH: 25.6 pg — ABNORMAL LOW (ref 26.0–34.0)
MCHC: 31.5 g/dL (ref 30.0–36.0)
MCV: 81.4 fL (ref 80.0–100.0)
Platelets: 492 10*3/uL — ABNORMAL HIGH (ref 150–400)
RBC: 3.98 MIL/uL (ref 3.87–5.11)
RDW: 14.6 % (ref 11.5–15.5)
WBC: 10.4 10*3/uL (ref 4.0–10.5)
nRBC: 0 % (ref 0.0–0.2)

## 2019-03-14 LAB — BASIC METABOLIC PANEL
Anion gap: 9 (ref 5–15)
BUN: 19 mg/dL (ref 8–23)
CO2: 27 mmol/L (ref 22–32)
Calcium: 8.4 mg/dL — ABNORMAL LOW (ref 8.9–10.3)
Chloride: 105 mmol/L (ref 98–111)
Creatinine, Ser: 0.59 mg/dL (ref 0.44–1.00)
GFR calc Af Amer: >60 mL/min (ref >60)
GFR calc non Af Amer: >60 mL/min (ref >60)
Glucose, Bld: 126 mg/dL — ABNORMAL HIGH (ref 70–99)
Potassium: 3.6 mmol/L (ref 3.5–5.1)
Sodium: 141 mmol/L (ref 135–145)

## 2019-03-14 LAB — FERRITIN: Ferritin: 161 ng/mL (ref 11–307)

## 2019-03-14 LAB — GLUCOSE, CAPILLARY
Glucose-Capillary: 176 mg/dL — ABNORMAL HIGH (ref 70–99)
Glucose-Capillary: 247 mg/dL — ABNORMAL HIGH (ref 70–99)

## 2019-03-14 LAB — IRON AND TIBC
Iron: 58 ug/dL (ref 28–170)
Saturation Ratios: 24 % (ref 10.4–31.8)
TIBC: 242 ug/dL — ABNORMAL LOW (ref 250–450)
UIBC: 184 ug/dL

## 2019-03-14 LAB — C-REACTIVE PROTEIN: CRP: 3.3 mg/dL — ABNORMAL HIGH (ref <1.0)

## 2019-03-14 LAB — VITAMIN B12: Vitamin B-12: 215 pg/mL (ref 180–914)

## 2019-03-14 LAB — MAGNESIUM: Magnesium: 1.4 mg/dL — ABNORMAL LOW (ref 1.7–2.4)

## 2019-03-14 LAB — RETICULOCYTES
Immature Retic Fract: 19.3 % — ABNORMAL HIGH (ref 2.3–15.9)
RBC.: 3.95 MIL/uL (ref 3.87–5.11)
Retic Count, Absolute: 75.1 10*3/uL (ref 19.0–186.0)
Retic Ct Pct: 1.9 % (ref 0.4–3.1)

## 2019-03-14 LAB — HEPARIN LEVEL (UNFRACTIONATED): Heparin Unfractionated: 0.76 IU/mL — ABNORMAL HIGH (ref 0.30–0.70)

## 2019-03-14 LAB — FOLATE: Folate: 8.5 ng/mL (ref >5.9)

## 2019-03-14 MED ORDER — VITAMIN B-12 100 MCG PO TABS
100.0000 ug | ORAL_TABLET | Freq: Every day | ORAL | Status: DC
  Administered 2019-03-14 – 2019-03-15 (×2): 100 ug via ORAL
  Filled 2019-03-14 (×2): qty 1

## 2019-03-14 MED ORDER — MAGNESIUM SULFATE 2 GM/50ML IV SOLN
2.0000 g | Freq: Once | INTRAVENOUS | Status: AC
  Administered 2019-03-14: 2 g via INTRAVENOUS
  Filled 2019-03-14: qty 50

## 2019-03-14 NOTE — Discharge Instructions (Signed)
YOUR CARDIOLOGY TEAM HAS ARRANGED FOR AN E-VISIT FOR YOUR APPOINTMENT - PLEASE REVIEW IMPORTANT INFORMATION BELOW SEVERAL DAYS PRIOR TO YOUR APPOINTMENT  Due to the recent COVID-19 pandemic, we are transitioning in-person office visits to tele-medicine visits in an effort to decrease unnecessary exposure to our patients, their families, and staff. These visits are billed to your insurance just like a normal visit is. We also encourage you to sign up for MyChart if you have not already done so. You will need a smartphone if possible. For patients that do not have this, we can still complete the visit using a regular telephone but do prefer a smartphone to enable video when possible. You may have a family member that lives with you that can help. If possible, we also ask that you have a blood pressure cuff and scale at home to measure your blood pressure, heart rate and weight prior to your scheduled appointment. Patients with clinical needs that need an in-person evaluation and testing will still be able to come to the office if absolutely necessary. If you have any questions, feel free to call our office.   IF USING MYCHART - How to Download the MyChart App to Your SmartPhone   - If Apple, go to [employer] and type in MyChart in the search bar and download the app. If Android, ask patient to go to [employer] and type in [place] in the search bar and download the app. The app is free but as with any other app downloads, your phone may require you to verify saved payment information or Apple/Android password.  - You will need to then log into the app with your MyChart username and password, and select [facility] as your healthcare provider to link the account.  - When it is time for your visit, go to the MyChart app, find appointments, and click Begin Video Visit. Be sure to Select Allow for your device to access the Microphone and Camera for your visit. You will then be connected, and your provider  will be with you shortly.  **If you have any issues connecting or need assistance, please contact MyChart service desk [phone])**  **If using a computer, in order to ensure the best quality for your visit, you will need to use either of the following Internet Browsers: Insurance underwriter or Microsoft Edge**   IF USING DOXIMITY or DOXY.ME - The staff will give you instructions on receiving your link to join the meeting the day of your visit.      THE DAY OF YOUR APPOINTMENT  Approximately 15 minutes prior to your scheduled appointment, you will receive a telephone call from one of [facility] team - your caller ID may say "Unknown caller."  Our staff will confirm medications, vital signs for the day and any symptoms you may be experiencing. Please have this information available prior to the time of visit start. It may also be helpful for you to have a pad of paper and pen handy for any instructions given during your visit. They will also walk you through joining the smartphone meeting if this is a video visit.    CONSENT FOR TELE-HEALTH VISIT - PLEASE REVIEW  I hereby voluntarily request, consent and authorize [facility] and its employed or contracted physicians, physician assistants, nurse practitioners or other licensed health care professionals (the Practitioner), to provide me with telemedicine health care services (the Services") as deemed necessary by the treating Practitioner. I acknowledge and consent to receive the  Services by the Practitioner via telemedicine. I understand that the telemedicine visit will involve communicating with the Practitioner through live audiovisual communication technology and the disclosure of certain medical information by electronic transmission. I acknowledge that I have been given the opportunity to request an in-person assessment or other available alternative prior to the telemedicine visit and am voluntarily participating in the  telemedicine visit.  I understand that I have the right to withhold or withdraw my consent to the use of telemedicine in the course of my care at any time, without affecting my right to future care or treatment, and that the Practitioner or I may terminate the telemedicine visit at any time. I understand that I have the right to inspect all information obtained and/or recorded in the course of the telemedicine visit and may receive copies of available information for a reasonable fee.  I understand that some of the potential risks of receiving the Services via telemedicine include:   Delay or interruption in medical evaluation due to technological equipment failure or disruption;  Information transmitted may not be sufficient (e.g. poor resolution of images) to allow for appropriate medical decision making by the Practitioner; and/or   In rare instances, security protocols could fail, causing a breach of personal health information.  Furthermore, I acknowledge that it is my responsibility to provide information about my medical history, conditions and care that is complete and accurate to the best of my ability. I acknowledge that Practitioner's advice, recommendations, and/or decision may be based on factors not within their control, such as incomplete or inaccurate data provided by me or distortions of diagnostic images or specimens that may result from electronic transmissions. I understand that the practice of medicine is not an exact science and that Practitioner makes no warranties or guarantees regarding treatment outcomes. I acknowledge that I will receive a copy of this consent concurrently upon execution via email to the email address I last provided but may also request a printed copy by calling the office of [facility].    I understand that my insurance will be billed for this visit.   I have read or had this consent read to me.  I understand the contents of this consent, which  adequately explains the benefits and risks of the Services being provided via telemedicine.   I have been provided ample opportunity to ask questions regarding this consent and the Services and have had my questions answered to my satisfaction.  I give my informed consent for the services to be provided through the use of telemedicine in my medical care  By participating in this telemedicine visit I agree to the above.

## 2019-03-14 NOTE — Evaluation (Signed)
Physical Therapy Evaluation Patient Details Name: Kathryn Bruce MRN: [id number] DOB: 06-Sep-1945 Today's Date: 03/14/2019   History of Present Illness  Pt adm with acute hypoxic respiratory failure due to covid. PMH - CAD, DM, HTN, coronary stent  Clinical Impression  Pt presents to PT with slightly unsteady gait. Expect will return to baseline quickly. Will follow acutely and don't feel will need PT after DC.    Follow Up Recommendations No PT follow up    Equipment Recommendations  None recommended by PT    Recommendations for Other Services       Precautions / Restrictions Precautions Precautions: None Restrictions Weight Bearing Restrictions: No      Mobility  Bed Mobility Overal bed mobility: Modified Independent;Needs Assistance Bed Mobility: Sit to Supine       Sit to supine: Modified independent (Device/Increase time)      Transfers Overall transfer level: Needs assistance Equipment used: None Transfers: Sit to/from Stand Sit to Stand: Supervision         General transfer comment: supervision for safety and lines  Ambulation/Gait Ambulation/Gait assistance: Min guard Gait Distance (Feet): 90 Feet Assistive device: None Gait Pattern/deviations: Step-through pattern;Decreased stride length Gait velocity: decr Gait velocity interpretation: <1.31 ft/sec, indicative of household ambulator General Gait Details: Assist for safety/lines  Stairs            Wheelchair Mobility    Modified Rankin (Stroke Patients Only)       Balance Overall balance assessment: Mild deficits observed, not formally tested                                           Pertinent Vitals/Pain Pain Assessment: No/denies pain    Home Living Family/patient expects to be discharged to:: Private residence Living Arrangements: Spouse/significant other Available Help at Discharge: Family(husband currently in hospital and will need SNF) Type of Home:  House Home Access: Level entry     Home Layout: One level Home Equipment: Walker - 2 wheels      Prior Function Level of Independence: Independent               Hand Dominance        Extremity/Trunk Assessment   Upper Extremity Assessment Upper Extremity Assessment: Overall WFL for tasks assessed    Lower Extremity Assessment Lower Extremity Assessment: Overall WFL for tasks assessed       Communication   Communication: No difficulties  Cognition Arousal/Alertness: Awake/alert Behavior During Therapy: WFL for tasks assessed/performed Overall Cognitive Status: Within Functional Limits for tasks assessed                                        General Comments General comments (skin integrity, edema, etc.): Amb on RA with SpO2 >92%    Exercises     Assessment/Plan    PT Assessment Patient needs continued PT services  PT Problem List Decreased balance;Decreased mobility       PT Treatment Interventions Gait training;Functional mobility training;Therapeutic activities;Therapeutic exercise;Balance training;Patient/family education    PT Goals (Current goals can be found in the Care Plan section)  Acute Rehab PT Goals Patient Stated Goal: return home PT Goal Formulation: With patient Time For Goal Achievement: 03/28/19 Potential to Achieve Goals: Good    Frequency Min 3X/week  Barriers to discharge Decreased caregiver support significant other is hospitalized and will require SNF    Co-evaluation               AM-PAC PT "6 Clicks" Mobility  Outcome Measure Help needed turning from your back to your side while in a flat bed without using bedrails?: None Help needed moving from lying on your back to sitting on the side of a flat bed without using bedrails?: None Help needed moving to and from a bed to a chair (including a wheelchair)?: A Little Help needed standing up from a chair using your arms (e.g., wheelchair or bedside  chair)?: A Little Help needed to walk in hospital room?: A Little Help needed climbing 3-5 steps with a railing? : A Little 6 Click Score: 20    End of Session   Activity Tolerance: Patient tolerated treatment well Patient left: in bed;with call bell/phone within reach Nurse Communication: Mobility status PT Visit Diagnosis: Other abnormalities of gait and mobility (R26.89)    Time: [id number] PT Time Calculation (min) (ACUTE ONLY): 13 min   Charges:   PT Evaluation $PT Eval Low Complexity: 1 Low          [place] Pager [phone] Office [place] 03/14/2019, 4:26 PM

## 2019-03-14 NOTE — Progress Notes (Signed)
Progress Note  Patient Name: Kathryn Bruce Date of Encounter: 03/14/2019  Primary Cardiologist: Buford Dresser, MD   Subjective   Interview conducted by phone due to coronavirus pandemic.  Patient denies chest pain.  Inpatient Medications    Scheduled Meds:  aspirin EC  81 mg Oral Daily   atorvastatin  40 mg Oral Daily   carvedilol  12.5 mg Oral BID WC   clopidogrel  75 mg Oral Daily   dexamethasone (DECADRON) injection  6 mg Intravenous Daily   DULoxetine  60 mg Oral Daily   ezetimibe  10 mg Oral Daily   gabapentin  300 mg Oral QHS   insulin aspart  0-9 Units Subcutaneous TID WC   lisinopril  20 mg Oral Daily   magnesium oxide  200 mg Oral BID   pantoprazole  40 mg Oral Daily   Continuous Infusions:  cefTRIAXone (ROCEPHIN)  IV Stopped (03/13/19 2127)   magnesium sulfate bolus IVPB     remdesivir 100 mg in NS 100 mL 100 mg (03/14/19 0904)   PRN Meds: acetaminophen **OR** acetaminophen, nitroGLYCERIN, ondansetron **OR** ondansetron (ZOFRAN) IV   Vital Signs    Vitals:   03/13/19 2116 03/13/19 2131 03/14/19 0600 03/14/19 0722  BP: (!) 104/46 (!) 105/54 (!) 142/70 (!) 134/58  Pulse: 60  (!) 53 (!) 55  Resp: 20   20  Temp: 98.3 F (36.8 C)  97.8 F (36.6 C) 97.6 F (36.4 C)  TempSrc: Oral  Oral Oral  SpO2: 91%  91% 90%  Weight:      Height:       No intake or output data in the 24 hours ending 03/14/19 0910 Last 3 Weights 03/13/2019 11/25/2018 09/03/2018  Weight (lbs) 204 lb 2.3 oz 211 lb 9.6 oz 215 lb  Weight (kg) 92.6 kg 95.981 kg 97.523 kg      Telemetry    Sinus to sinus bradycardia- Personally Reviewed  ECG    Sinus with no diagnostic ST changes - Personally Reviewed  Physical Exam   Not performed due to coronavirus pandemic.  Answers questions appropriately.  Normal affect.  No acute distress.  Labs    High Sensitivity Troponin:   Recent Labs  Lab 03/11/19 2130 03/12/19 0343 03/12/19 0849 03/12/19 0950 03/13/19 0432    TROPONINIHS 720* 2,107* 1,683* 1,548* 564*      Chemistry Recent Labs  Lab 03/12/19 0849 03/13/19 0432 03/14/19 0513  NA 138 141 141  K 3.2* 3.5 3.6  CL 98 103 105  CO2 27 26 27   GLUCOSE 177* 131* 126*  BUN 13 15 19   CREATININE 0.71 0.64 0.59  CALCIUM 8.5* 8.6* 8.4*  PROT 6.3* 6.5  --   ALBUMIN 2.4* 2.4*  --   AST 21 18  --   ALT 19 19  --   ALKPHOS 50 55  --   BILITOT 0.7 0.4  --   GFRNONAA >60 >60 >60  GFRAA >60 >60 >60  ANIONGAP 13 12 9      Hematology Recent Labs  Lab 03/12/19 0849 03/13/19 0432 03/14/19 0513  WBC 8.2 10.7* 10.4  RBC 4.23 4.43 3.98   3.95  HGB 10.9* 11.6* 10.2*  HCT 34.9* 35.9* 32.4*  MCV 82.5 81.0 81.4  MCH 25.8* 26.2 25.6*  MCHC 31.2 32.3 31.5  RDW 15.0 14.5 14.6  PLT 422* 521* 492*    BNP Recent Labs  Lab 03/12/19 0849  BNP 53.9     DDimer  Recent Labs  Lab 03/11/19  2130  DDIMER 1.50*     Radiology    ECHOCARDIOGRAM LIMITED  Result Date: 03/13/2019   ECHOCARDIOGRAM REPORT   Patient Name:   Kathryn Bruce Date of Exam: 03/13/2019 Medical Rec #:  [id number]      Height:       65.0 in Accession #:    [id number]     Weight:       204.1 lb Date of Birth:  Apr 12, 1945     BSA:          2.00 m Patient Age:    74 years       BP:           105/49 mmHg Patient Gender: F              HR:           75 bpm. Exam Location:  Inpatient Procedure: Limited Echo, Limited Color Doppler and Cardiac Doppler Indications:    elevated troponin  History:        Patient has no prior history of Echocardiogram examinations.                 Covid.  Sonographer:    Johny Chess Referring PhysXG:[id number] [place]  1. Left ventricular ejection fraction, by visual estimation, is 65 to 70%. The left ventricle has hyperdynamic function. There is no left ventricular hypertrophy.  2. Left ventricular diastolic parameters are consistent with Grade I diastolic dysfunction (impaired relaxation).  3. The left ventricle has no regional wall motion  abnormalities.  4. Global right ventricle has normal systolic function.The right ventricular size is normal. No increase in right ventricular wall thickness.  5. Left atrial size was normal.  6. Right atrial size was normal.  7. The mitral valve is normal in structure. Trivial mitral valve regurgitation. No evidence of mitral stenosis.  8. The tricuspid valve is normal in structure.  9. The aortic valve is normal in structure. Aortic valve regurgitation is not visualized. No evidence of aortic valve sclerosis or stenosis. 10. The pulmonic valve was normal in structure. Pulmonic valve regurgitation is not visualized. 11. Normal pulmonary artery systolic pressure. 12. The inferior vena cava is normal in size with greater than 50% respiratory variability, suggesting right atrial pressure of 3 mmHg. FINDINGS  Left Ventricle: Left ventricular ejection fraction, by visual estimation, is 65 to 70%. The left ventricle has hyperdynamic function. The left ventricle has no regional wall motion abnormalities. There is no left ventricular hypertrophy. Left ventricular diastolic parameters are consistent with Grade I diastolic dysfunction (impaired relaxation). Normal left atrial pressure. Right Ventricle: The right ventricular size is normal. No increase in right ventricular wall thickness. Global RV systolic function is has normal systolic function. The tricuspid regurgitant velocity is 2.00 m/s, and with an assumed right atrial pressure  of 3 mmHg, the estimated right ventricular systolic pressure is normal at 19.0 mmHg. Left Atrium: Left atrial size was normal in size. Right Atrium: Right atrial size was normal in size Pericardium: There is no evidence of pericardial effusion. Mitral Valve: The mitral valve is normal in structure. Trivial mitral valve regurgitation. No evidence of mitral valve stenosis by observation. Tricuspid Valve: The tricuspid valve is normal in structure. Tricuspid valve regurgitation is trivial. Aortic  Valve: The aortic valve is normal in structure. Aortic valve regurgitation is not visualized. The aortic valve is structurally normal, with no evidence of sclerosis or stenosis. Pulmonic Valve: The pulmonic valve was normal in structure.  Pulmonic valve regurgitation is not visualized. Pulmonic regurgitation is not visualized. Aorta: The aortic root, ascending aorta and aortic arch are all structurally normal, with no evidence of dilitation or obstruction. Venous: The inferior vena cava is normal in size with greater than 50% respiratory variability, suggesting right atrial pressure of 3 mmHg. IAS/Shunts: No atrial level shunt detected by color flow Doppler. There is no evidence of a patent foramen ovale. No ventricular septal defect is seen or detected. There is no evidence of an atrial septal defect.  LEFT VENTRICLE PLAX 2D LVIDd:         4.20 cm  Diastology LVIDs:         2.40 cm  LV e' lateral:   10.70 cm/s LV PW:         1.10 cm  LV E/e' lateral: 11.2 LV IVS:        1.10 cm  LV e' medial:    9.03 cm/s LVOT diam:     1.90 cm  LV E/e' medial:  13.3 LV SV:         58 ml LV SV Index:   27.85 LVOT Area:     2.84 cm  LEFT ATRIUM         Index LA diam:    3.80 cm 1.90 cm/m  AORTIC VALVE LVOT Vmax:   120.00 cm/s LVOT Vmean:  72.200 cm/s LVOT VTI:    0.237 m  AORTA Ao Root diam: 2.70 cm Ao Asc diam:  3.20 cm MITRAL VALVE                         TRICUSPID VALVE MV Area (PHT): 2.42 cm              TR Peak grad:   16.0 mmHg MV PHT:        90.77 msec            TR Vmax:        200.00 cm/s MV Decel Time: 313 msec MV E velocity: 120.00 cm/s 103 cm/s  SHUNTS MV A velocity: 135.00 cm/s 70.3 cm/s Systemic VTI:  0.24 m MV E/A ratio:  0.89        1.5       Systemic Diam: 1.90 cm  Ena Dawley MD Electronically signed by Ena Dawley MD Signature Date/Time: 03/13/2019/4:53:47 PM    Final     Patient Profile     74 y.o. female with past medical history of coronary artery disease status post PCI of the proximal LAD,  hypertension, diabetes mellitus admitted with coronavirus infection.  Noted to have elevated troponin and cardiology asked to evaluate.  Assessment & Plan    1 elevated troponin-troponin has trended down.  No acute ST changes.  LV function normal by echocardiogram.  Likely demand ischemia in the setting of Covid infection.  Continue aspirin, Plavix and statin.  Continue home dose of beta-blocker.  Discontinue heparin.  We will arrange outpatient nuclear study once she has recovered from Covid illness.  2 hypertension-patient's blood pressure is controlled.  Continue present medications and follow.  3 hyperlipidemia-continue present medications.  4 Covid infection-management per primary care.  [facility] will sign off.   Medication Recommendations: Continue preadmission cardiac medications. Other recommendations (labs, testing, etc): Outpatient Lexiscan nuclear study after she recovers from Covid infection. Follow up as an outpatient: We will arrange outpatient follow-up with Dr. Harrell Gave in 4 to 6 weeks.  For questions or updates, please contact  [facility] Please consult [email or web address] for contact info under        Signed, Kirk Ruths, MD  03/14/2019, 9:10 AM

## 2019-03-14 NOTE — Progress Notes (Signed)
[place] for Heparin Indication: chest pain/ACS  No Known Allergies  Patient Measurements: Height: 5\' 5"  (165.1 cm) Weight: 204 lb 2.3 oz (92.6 kg) IBW/kg (Calculated) : 57 Heparin Dosing Weight: 80 kg  Vital Signs: Temp: 97.6 F (36.4 C) (01/04 0722) Temp Source: Oral (01/04 0722) BP: 134/58 (01/04 0722) Pulse Rate: 55 (01/04 0722)  Labs: Recent Labs    03/12/19 0849 03/12/19 0950 03/13/19 0432 03/14/19 0513  HGB 10.9*  --  11.6* 10.2*  HCT 34.9*  --  35.9* 32.4*  PLT 422*  --  521* 492*  HEPARINUNFRC  --  0.34 0.41 0.76*  CREATININE 0.71  --  0.64 0.59  TROPONINIHS 1,683* 1,548* 564*  --     Estimated Creatinine Clearance: 69.3 mL/min (by C-G formula based on SCr of 0.59 mg/dL).  Assessment: 74 y.o. female with SOB and elevated cardiac markers, COVD+ continues on IV heparin for r/o ACS.   Hep has been on for 48 hrs  Goal of Therapy:  Heparin level 0.3-0.7 units/ml Monitor platelets by anticoagulation protocol: Yes   Plan:  Dc heparin d/t > 48 hr duration per cardiology  Barth Kirks, PharmD, BCPS, BCCCP Clinical Pharmacist [phone]  Please check AMION for all [place] numbers  03/14/2019 8:35 AM

## 2019-03-14 NOTE — Progress Notes (Signed)
PROGRESS NOTE    Kathryn Bruce  [id number] DOB: 1945-09-02 DOA: 03/11/2019 PCP: Maurice Small, MD   Brief Narrative: 74 year old with past medical history significant for CAD status post stenting, diabetes type 2, hypertension presented to ER with increasing shortness of breath.  Patient was diagnosed with Covid infection 10 days prior to admission.  She reports generalized body aches and flulike symptoms.  Over the last 2 days prior to admission she developed increasing shortness of breath and was found to have low oxygen.  Patient in the ED patient was found to be hypoxic requiring 3 L of oxygen, chest x-ray showed bilateral infiltrates.  EKG normal sinus rhythm no ST elevation.  Troponin 720---2107.  Angio negative for PE.  Patient was a started on heparin drip, cardiology has been consulted.   Assessment & Plan:   Principal Problem:   Acute respiratory failure due to COVID-19 [place] [facility]) Active Problems:   Type 2 diabetes mellitus with obesity (HCC)   CAD (coronary artery disease), native coronary artery   Acute respiratory failure with hypoxia (HCC)   1-Acute Hypoxic Respiratory Failure secondary to Covid 19 PNA;  -CT angio negative for PE, bilateral ground-glass opacities.  -Present with Dyspnea, New onset hypoxemia sat 80 %, currently on 3 L  -Continue with Remdesivir and Dexamethasone. Day 3.  -Inflammatory Markers trending down, stable.  Trying to taper off oxygen supplement.  COVID-19 Labs  Recent Labs    03/11/19 2130 03/12/19 0849 03/13/19 0432 03/14/19 0513  DDIMER 1.50*  --   --   --   FERRITIN 160 148 173 161  LDH 221*  --   --   --   CRP 14.9* 12.0* 8.5*  --     Lab Results  Component Value Date   SARSCOV2NAA POSITIVE (A) 03/12/2019   [place] Not Detected 12/20/2018    2-Demand ischemia in setting of covid PNA Troponin 2000, range, EKG no ST elevation.  Treated  with Heparin Gtt. for 48-hour Continue with statins, BB, aspirin, Plavix.  Cardiology  recommend conservative management, unless patient developed chest pain or EKG changes during this admission.  Patient will need outpatient follow-up for stress test. ECHO; normal EF, no wall motion abnormalities Per cardiology patient had demand ischemia from  Covid. Plan for out patient follow up with stress test.   3-Hypokalema; Resolved  4-Hypomagnesemia; 1,4, replete IV>   5-Diabetes Mellitus type 2;  Continue with SSI.  Hold glipizide while inpatient to avoid hypoglycemia. Marland Kitchen   6-HTN; continue with carvedilol and lisinopril.  Hold hydrochlorothiazide  7-Normocytic anemia;  Low hemoglobin trend. B 12 low normal range, will start supplement.   UTI; UA with 21-50 WBC. Started on ceftriaxone. Will send Urine culture.   Estimated body mass index is 33.97 kg/m as calculated from the following:   Height as of this encounter: 5\' 5"  (1.651 m).   Weight as of this encounter: 92.6 kg.   DVT prophylaxis: Heparin drip Code Status: Full code Family Communication: Discussed with patient Disposition Plan: Remain in the hospital for treatment of COVID-19 pneumonia and non-STEMI Consultants:   Cardiology  Procedures:   None  Antimicrobials: Others Remdesivir  Subjective: Denies chest pain. Sitting recliner.  Denies worsening dyspnea.   Objective: Vitals:   03/13/19 2116 03/13/19 2131 03/14/19 0600 03/14/19 0722  BP: (!) 104/46 (!) 105/54 (!) 142/70 (!) 134/58  Pulse: 60  (!) 53 (!) 55  Resp: 20   20  Temp: 98.3 F (36.8 C)  97.8 F (36.6 C) 97.6  F (36.4 C)  TempSrc: Oral  Oral Oral  SpO2: 91%  91% 90%  Weight:      Height:        Intake/Output Summary (Last 24 hours) at 03/14/2019 1244 Last data filed at 03/14/2019 [id number] Gross per 24 hour  Intake 240 ml  Output --  Net 240 ml   Filed Weights   03/13/19 0421  Weight: 92.6 kg    Examination:  General exam: NAD Respiratory system: CTA Cardiovascular system: S 1, S 2 RRR Gastrointestinal system: BS present,  soft, NT Central nervous system: Non focal.  Extremities: Symmetric power.  Skin: No rashes   Data Reviewed: I have personally reviewed following labs and imaging studies  CBC: Recent Labs  Lab 03/11/19 1755 03/12/19 0849 03/13/19 0432 03/14/19 0513  WBC 9.8 8.2 10.7* 10.4  NEUTROABS  --  5.0 7.1  --   HGB 11.8* 10.9* 11.6* 10.2*  HCT 36.8 34.9* 35.9* 32.4*  MCV 81.4 82.5 81.0 81.4  PLT 496* 422* 521* [id number]*   Basic Metabolic Panel: Recent Labs  Lab 03/11/19 1755 03/12/19 0849 03/12/19 0950 03/13/19 0432 03/14/19 0513  NA 137 138  --  141 141  K 3.3* 3.2*  --  3.5 3.6  CL 98 98  --  103 105  CO2 25 27  --  26 27  GLUCOSE 176* 177*  --  131* 126*  BUN 14 13  --  15 19  CREATININE 0.70 0.71  --  0.64 0.59  CALCIUM 9.1 8.5*  --  8.6* 8.4*  MG  --   --  1.2*  --  1.4*   GFR: Estimated Creatinine Clearance: 69.3 mL/min (by C-G formula based on SCr of 0.59 mg/dL). Liver Function Tests: Recent Labs  Lab 03/12/19 0849 03/13/19 0432  AST 21 18  ALT 19 19  ALKPHOS 50 55  BILITOT 0.7 0.4  PROT 6.3* 6.5  ALBUMIN 2.4* 2.4*   No results for input(s): LIPASE, AMYLASE in the last 168 hours. No results for input(s): AMMONIA in the last 168 hours. Coagulation Profile: No results for input(s): INR, PROTIME in the last 168 hours. Cardiac Enzymes: No results for input(s): CKTOTAL, CKMB, CKMBINDEX, TROPONINI in the last 168 hours. BNP (last 3 results) No results for input(s): PROBNP in the last 8760 hours. HbA1C: No results for input(s): HGBA1C in the last 72 hours. CBG: Recent Labs  Lab 03/12/19 0846 03/12/19 1152 03/12/19 1708 03/12/19 2159 03/14/19 1200  GLUCAP 168* 171* 295* 308* 176*   Lipid Profile: Recent Labs    03/11/19 2204  TRIG 117   Thyroid Function Tests: No results for input(s): TSH, T4TOTAL, FREET4, T3FREE, THYROIDAB in the last 72 hours. Anemia Panel: Recent Labs    03/13/19 0432 03/14/19 0513  VITAMINB12  --  215  FOLATE  --  8.5    FERRITIN 173 161  TIBC  --  242*  IRON  --  58  RETICCTPCT  --  1.9   Sepsis Labs: Recent Labs  Lab 03/11/19 2130 03/12/19 0849  PROCALCITON 0.23 <0.10  LATICACIDVEN 1.6  --     Recent Results (from the past 240 hour(s))  Blood Culture (routine x 2)     Status: None (Preliminary result)   Collection Time: 03/11/19  9:34 PM   Specimen: BLOOD RIGHT FOREARM  Result Value Ref Range Status   Specimen Description BLOOD RIGHT FOREARM  Final   Special Requests   Final    BOTTLES DRAWN AEROBIC AND ANAEROBIC  Blood Culture adequate volume   Culture   Final    NO GROWTH 2 DAYS Performed at [facility], [place] [address], [place], [place] [address]    Report Status PENDING  Incomplete  Respiratory Panel by RT PCR (Flu A&B, Covid) - Nasopharyngeal Swab     Status: Abnormal   Collection Time: 03/12/19  2:42 AM   Specimen: Nasopharyngeal Swab  Result Value Ref Range Status   SARS Coronavirus 2 by RT PCR POSITIVE (A) NEGATIVE Final    Comment: RESULT CALLED TO, READ BACK BY AND VERIFIED WITH: Cindy Hazy RN 03/12/19 0338 JDW (NOTE) SARS-CoV-2 target nucleic acids are DETECTED. SARS-CoV-2 RNA is generally detectable in upper respiratory specimens  during the acute phase of infection. Positive results are indicative of the presence of the identified virus, but do not rule out bacterial infection or co-infection with other pathogens not detected by the test. Clinical correlation with patient history and other diagnostic information is necessary to determine patient infection status. The expected result is Negative. Fact Sheet for Patients:  PinkCheek.be Fact Sheet for Healthcare Providers: GravelBags.it This test is not yet approved or cleared by the [place] FDA and  has been authorized for detection and/or diagnosis of SARS-CoV-2 by FDA under an Emergency Use Authorization (EUA).  This EUA will remain in effect (meaning  this test can be used) for the  duration of  the COVID-19 declaration under Section 564(b)(1) of the Act, 21 U.S.C. section 360bbb-3(b)(1), unless the authorization is terminated or revoked sooner.    Influenza A by PCR NEGATIVE NEGATIVE Final   Influenza B by PCR NEGATIVE NEGATIVE Final    Comment: (NOTE) The Xpert Xpress SARS-CoV-2/FLU/RSV assay is intended as an aid in  the diagnosis of influenza from Nasopharyngeal swab specimens and  should not be used as a sole basis for treatment. Nasal washings and  aspirates are unacceptable for Xpert Xpress SARS-CoV-2/FLU/RSV  testing. Fact Sheet for Patients: PinkCheek.be Fact Sheet for Healthcare Providers: GravelBags.it This test is not yet approved or cleared by the [place] FDA and  has been authorized for detection and/or diagnosis of SARS-CoV-2 by  FDA under an Emergency Use Authorization (EUA). This EUA will remain  in effect (meaning this test can be used) for the duration of the  Covid-19 declaration under Section 564(b)(1) of the Act, 21  U.S.C. section 360bbb-3(b)(1), unless the authorization is  terminated or revoked. Performed at [facility], [place] [address] [place], [place] [address]   Blood Culture (routine x 2)     Status: None (Preliminary result)   Collection Time: 03/12/19  8:35 AM   Specimen: BLOOD  Result Value Ref Range Status   Specimen Description BLOOD LEFT ANTECUBITAL  Final   Special Requests   Final    BOTTLES DRAWN AEROBIC AND ANAEROBIC Blood Culture results may not be optimal due to an inadequate volume of blood received in culture bottles   Culture   Final    NO GROWTH < 24 HOURS Performed at [facility], [place] [address], [place], [place] [address]    Report Status PENDING  Incomplete         Radiology Studies: ECHOCARDIOGRAM LIMITED  Result Date: 03/13/2019   ECHOCARDIOGRAM REPORT   Patient Name:   GENYSIS BALLAS Date of  Exam: 03/13/2019 Medical Rec #:  [id number]      Height:       65.0 in Accession #:    [id number]  Weight:       204.1 lb Date of Birth:  1945-08-02     BSA:          2.00 m Patient Age:    25 years       BP:           105/49 mmHg Patient Gender: F              HR:           75 bpm. Exam Location:  Inpatient Procedure: Limited Echo, Limited Color Doppler and Cardiac Doppler Indications:    elevated troponin  History:        Patient has no prior history of Echocardiogram examinations.                 Covid.  Sonographer:    Johny Chess Referring PhysXG:[id number] [place]  1. Left ventricular ejection fraction, by visual estimation, is 65 to 70%. The left ventricle has hyperdynamic function. There is no left ventricular hypertrophy.  2. Left ventricular diastolic parameters are consistent with Grade I diastolic dysfunction (impaired relaxation).  3. The left ventricle has no regional wall motion abnormalities.  4. Global right ventricle has normal systolic function.The right ventricular size is normal. No increase in right ventricular wall thickness.  5. Left atrial size was normal.  6. Right atrial size was normal.  7. The mitral valve is normal in structure. Trivial mitral valve regurgitation. No evidence of mitral stenosis.  8. The tricuspid valve is normal in structure.  9. The aortic valve is normal in structure. Aortic valve regurgitation is not visualized. No evidence of aortic valve sclerosis or stenosis. 10. The pulmonic valve was normal in structure. Pulmonic valve regurgitation is not visualized. 11. Normal pulmonary artery systolic pressure. 12. The inferior vena cava is normal in size with greater than 50% respiratory variability, suggesting right atrial pressure of 3 mmHg. FINDINGS  Left Ventricle: Left ventricular ejection fraction, by visual estimation, is 65 to 70%. The left ventricle has hyperdynamic function. The left ventricle has no regional wall motion abnormalities.  There is no left ventricular hypertrophy. Left ventricular diastolic parameters are consistent with Grade I diastolic dysfunction (impaired relaxation). Normal left atrial pressure. Right Ventricle: The right ventricular size is normal. No increase in right ventricular wall thickness. Global RV systolic function is has normal systolic function. The tricuspid regurgitant velocity is 2.00 m/s, and with an assumed right atrial pressure  of 3 mmHg, the estimated right ventricular systolic pressure is normal at 19.0 mmHg. Left Atrium: Left atrial size was normal in size. Right Atrium: Right atrial size was normal in size Pericardium: There is no evidence of pericardial effusion. Mitral Valve: The mitral valve is normal in structure. Trivial mitral valve regurgitation. No evidence of mitral valve stenosis by observation. Tricuspid Valve: The tricuspid valve is normal in structure. Tricuspid valve regurgitation is trivial. Aortic Valve: The aortic valve is normal in structure. Aortic valve regurgitation is not visualized. The aortic valve is structurally normal, with no evidence of sclerosis or stenosis. Pulmonic Valve: The pulmonic valve was normal in structure. Pulmonic valve regurgitation is not visualized. Pulmonic regurgitation is not visualized. Aorta: The aortic root, ascending aorta and aortic arch are all structurally normal, with no evidence of dilitation or obstruction. Venous: The inferior vena cava is normal in size with greater than 50% respiratory variability, suggesting right atrial pressure of 3 mmHg. IAS/Shunts: No atrial level shunt detected by color flow Doppler. There is  no evidence of a patent foramen ovale. No ventricular septal defect is seen or detected. There is no evidence of an atrial septal defect.  LEFT VENTRICLE PLAX 2D LVIDd:         4.20 cm  Diastology LVIDs:         2.40 cm  LV e' lateral:   10.70 cm/s LV PW:         1.10 cm  LV E/e' lateral: 11.2 LV IVS:        1.10 cm  LV e' medial:     9.03 cm/s LVOT diam:     1.90 cm  LV E/e' medial:  13.3 LV SV:         58 ml LV SV Index:   27.85 LVOT Area:     2.84 cm  LEFT ATRIUM         Index LA diam:    3.80 cm 1.90 cm/m  AORTIC VALVE LVOT Vmax:   120.00 cm/s LVOT Vmean:  72.200 cm/s LVOT VTI:    0.237 m  AORTA Ao Root diam: 2.70 cm Ao Asc diam:  3.20 cm MITRAL VALVE                         TRICUSPID VALVE MV Area (PHT): 2.42 cm              TR Peak grad:   16.0 mmHg MV PHT:        90.77 msec            TR Vmax:        200.00 cm/s MV Decel Time: 313 msec MV E velocity: 120.00 cm/s 103 cm/s  SHUNTS MV A velocity: 135.00 cm/s 70.3 cm/s Systemic VTI:  0.24 m MV E/A ratio:  0.89        1.5       Systemic Diam: 1.90 cm  Ena Dawley MD Electronically signed by Ena Dawley MD Signature Date/Time: 03/13/2019/4:53:47 PM    Final         Scheduled Meds:  aspirin EC  81 mg Oral Daily   atorvastatin  40 mg Oral Daily   carvedilol  12.5 mg Oral BID WC   clopidogrel  75 mg Oral Daily   dexamethasone (DECADRON) injection  6 mg Intravenous Daily   DULoxetine  60 mg Oral Daily   ezetimibe  10 mg Oral Daily   gabapentin  300 mg Oral QHS   insulin aspart  0-9 Units Subcutaneous TID WC   lisinopril  20 mg Oral Daily   magnesium oxide  200 mg Oral BID   pantoprazole  40 mg Oral Daily   Continuous Infusions:  cefTRIAXone (ROCEPHIN)  IV Stopped (03/13/19 2127)   remdesivir 100 mg in NS 100 mL 100 mg (03/14/19 0904)     LOS: 2 days    Time spent: 35 minutes.     Elmarie Shiley, MD [facility]   If 7PM-7AM, please contact night-coverage [email or web address] Password [facility] 03/14/2019, 12:44 PM

## 2019-03-15 LAB — C-REACTIVE PROTEIN: CRP: 1.5 mg/dL — ABNORMAL HIGH (ref <1.0)

## 2019-03-15 LAB — COMPREHENSIVE METABOLIC PANEL
ALT: 31 U/L (ref 0–44)
AST: 36 U/L (ref 15–41)
Albumin: 2.1 g/dL — ABNORMAL LOW (ref 3.5–5.0)
Alkaline Phosphatase: 53 U/L (ref 38–126)
Anion gap: 10 (ref 5–15)
BUN: 14 mg/dL (ref 8–23)
CO2: 26 mmol/L (ref 22–32)
Calcium: 8.1 mg/dL — ABNORMAL LOW (ref 8.9–10.3)
Chloride: 104 mmol/L (ref 98–111)
Creatinine, Ser: 0.58 mg/dL (ref 0.44–1.00)
GFR calc Af Amer: >60 mL/min (ref >60)
GFR calc non Af Amer: >60 mL/min (ref >60)
Glucose, Bld: 111 mg/dL — ABNORMAL HIGH (ref 70–99)
Potassium: 3.6 mmol/L (ref 3.5–5.1)
Sodium: 140 mmol/L (ref 135–145)
Total Bilirubin: 0.4 mg/dL (ref 0.3–1.2)
Total Protein: 5.4 g/dL — ABNORMAL LOW (ref 6.5–8.1)

## 2019-03-15 LAB — CBC
HCT: 34.1 % — ABNORMAL LOW (ref 36.0–46.0)
Hemoglobin: 10.7 g/dL — ABNORMAL LOW (ref 12.0–15.0)
MCH: 25.8 pg — ABNORMAL LOW (ref 26.0–34.0)
MCHC: 31.4 g/dL (ref 30.0–36.0)
MCV: 82.2 fL (ref 80.0–100.0)
Platelets: 475 10*3/uL — ABNORMAL HIGH (ref 150–400)
RBC: 4.15 MIL/uL (ref 3.87–5.11)
RDW: 14.8 % (ref 11.5–15.5)
WBC: 12.6 10*3/uL — ABNORMAL HIGH (ref 4.0–10.5)
nRBC: 0 % (ref 0.0–0.2)

## 2019-03-15 LAB — GLUCOSE, CAPILLARY
Glucose-Capillary: 302 mg/dL — ABNORMAL HIGH (ref 70–99)
Glucose-Capillary: 283 mg/dL — ABNORMAL HIGH (ref 70–99)

## 2019-03-15 MED ORDER — VITAMIN B-12 1000 MCG PO TABS
1000.0000 ug | ORAL_TABLET | Freq: Every day | ORAL | Status: DC
  Administered 2019-03-16: 1000 ug via ORAL
  Filled 2019-03-15: qty 1

## 2019-03-15 MED ORDER — MAGNESIUM SULFATE 2 GM/50ML IV SOLN
2.0000 g | Freq: Once | INTRAVENOUS | Status: AC
  Administered 2019-03-15: 2 g via INTRAVENOUS
  Filled 2019-03-15: qty 50

## 2019-03-15 NOTE — Progress Notes (Signed)
PROGRESS NOTE    Kathryn Bruce  [id number] DOB: 1945/10/10 DOA: 03/11/2019 PCP: Maurice Small, MD   Brief Narrative: 74 year old with past medical history significant for CAD status post stenting, diabetes type 2, hypertension presented to ER with increasing shortness of breath.  Patient was diagnosed with Covid infection 10 days prior to admission.  She reports generalized body aches and flulike symptoms.  Over the last 2 days prior to admission she developed increasing shortness of breath and was found to have low oxygen.  Patient in the ED patient was found to be hypoxic requiring 3 L of oxygen, chest x-ray showed bilateral infiltrates.  EKG normal sinus rhythm no ST elevation.  Troponin 720---2107.  Angio negative for PE.  Patient was a started on heparin drip, cardiology has been consulted.   Assessment & Plan:   Principal Problem:   Acute respiratory failure due to COVID-19 [facility]) Active Problems:   Type 2 diabetes mellitus with obesity (HCC)   CAD (coronary artery disease), native coronary artery   Acute respiratory failure with hypoxia (HCC)   1-Acute Hypoxic Respiratory Failure secondary to Covid 19 PNA;  -CT angio negative for PE, bilateral ground-glass opacities.  -Present with Dyspnea, New onset hypoxemia sat 80 %, currently on 3 L  -Continue with Remdesivir and Dexamethasone. Day 4.  -Inflammatory Markers trending down, stable.  Off oxygen.  She will complete Remdesivir 1-06. Might be able to be discharge 1-06 COVID-19 Labs  Recent Labs    03/13/19 0432 03/14/19 0513 03/15/19 0657  FERRITIN 173 161  --   CRP 8.5* 3.3* 1.5*    Lab Results  Component Value Date   SARSCOV2NAA POSITIVE (A) 03/12/2019   [place] Not Detected 12/20/2018    2-Demand ischemia in setting of covid PNA Troponin 2000, range, EKG no ST elevation.  Treated  with Heparin Gtt. for 48-hour Continue with statins, BB, aspirin, Plavix.  Cardiology recommend conservative management,  unless patient developed chest pain or EKG changes during this admission.  Patient will need outpatient follow-up for stress test. ECHO; normal EF, no wall motion abnormalities Per cardiology patient had demand ischemia from  Covid. Plan for out patient follow up with stress test.   3-Hypokalema; Resolved  4-Hypomagnesemia; 1,4, replete IV> check level tomorrow  5-Diabetes Mellitus type 2;  Continue with SSI.  Hold glipizide while inpatient to avoid hypoglycemia. Marland Kitchen   6-HTN; continue with carvedilol and lisinopril.  Hold hydrochlorothiazide  7-Normocytic anemia;  Low hemoglobin trend. B 12 low normal range, started supplement.   UTI; UA with 21-50 WBC. Started on ceftriaxone. Day 3.  Ordered  Urine culture.   Estimated body mass index is 33.97 kg/m as calculated from the following:   Height as of this encounter: 5\' 5"  (1.651 m).   Weight as of this encounter: 92.6 kg.   DVT prophylaxis: Heparin drip Code Status: Full code Family Communication: Discussed with patient and daughter.  Disposition Plan: Patient might be able to go home 1-06 after she complete Remdesivir.  Consultants:   Cardiology  Procedures:   None  Antimicrobials: Others Remdesivir  Subjective: She denies chest pain, she is feeling better.    Objective: Vitals:   03/14/19 1613 03/14/19 1956 03/15/19 0633 03/15/19 0720  BP: (!) 126/45 (!) 122/57 137/64 127/67  Pulse: 68 61 62 (!) 58  Resp: 18 16 20 15   Temp: 98 F (36.7 C) 98 F (36.7 C) 97.9 F (36.6 C) 98.3 F (36.8 C)  TempSrc: Oral Oral Oral Oral  SpO2:  98% 96% 93% 92%  Weight:      Height:        Intake/Output Summary (Last 24 hours) at 03/15/2019 1438 Last data filed at 03/15/2019 0300 Gross per 24 hour  Intake 459.17 ml  Output --  Net 459.17 ml   Filed Weights   03/13/19 0421  Weight: 92.6 kg    Examination:  General exam: NAD Respiratory system: CTA Cardiovascular system: S 1, S 2 RRR Gastrointestinal system: BS present,  soft,nt  Central nervous system: non focal.  Extremities: Symmetric power  Skin: No rashes   Data Reviewed: I have personally reviewed following labs and imaging studies  CBC: Recent Labs  Lab 03/11/19 1755 03/12/19 0849 03/13/19 0432 03/14/19 0513 03/15/19 0657  WBC 9.8 8.2 10.7* 10.4 12.6*  NEUTROABS  --  5.0 7.1  --   --   HGB 11.8* 10.9* 11.6* 10.2* 10.7*  HCT 36.8 34.9* 35.9* 32.4* 34.1*  MCV 81.4 82.5 81.0 81.4 82.2  PLT 496* 422* 521* 492* 123XX123*   Basic Metabolic Panel: Recent Labs  Lab 03/11/19 1755 03/12/19 0849 03/12/19 0950 03/13/19 0432 03/14/19 0513 03/15/19 0657  NA 137 138  --  141 141 140  K 3.3* 3.2*  --  3.5 3.6 3.6  CL 98 98  --  103 105 104  CO2 25 27  --  26 27 26   GLUCOSE 176* 177*  --  131* 126* 111*  BUN 14 13  --  15 19 14   CREATININE 0.70 0.71  --  0.64 0.59 0.58  CALCIUM 9.1 8.5*  --  8.6* 8.4* 8.1*  MG  --   --  1.2*  --  1.4*  --    GFR: Estimated Creatinine Clearance: 69.3 mL/min (by C-G formula based on SCr of 0.58 mg/dL). Liver Function Tests: Recent Labs  Lab 03/12/19 0849 03/13/19 0432 03/15/19 0657  AST 21 18 36  ALT 19 19 31   ALKPHOS 50 55 53  BILITOT 0.7 0.4 0.4  PROT 6.3* 6.5 5.4*  ALBUMIN 2.4* 2.4* 2.1*   No results for input(s): LIPASE, AMYLASE in the last 168 hours. No results for input(s): AMMONIA in the last 168 hours. Coagulation Profile: No results for input(s): INR, PROTIME in the last 168 hours. Cardiac Enzymes: No results for input(s): CKTOTAL, CKMB, CKMBINDEX, TROPONINI in the last 168 hours. BNP (last 3 results) No results for input(s): PROBNP in the last 8760 hours. HbA1C: No results for input(s): HGBA1C in the last 72 hours. CBG: Recent Labs  Lab 03/12/19 1152 03/12/19 1708 03/12/19 2159 03/14/19 1200 03/14/19 1606  GLUCAP 171* 295* 308* 176* 247*   Lipid Profile: No results for input(s): CHOL, HDL, LDLCALC, TRIG, CHOLHDL, LDLDIRECT in the last 72 hours. Thyroid Function Tests: No  results for input(s): TSH, T4TOTAL, FREET4, T3FREE, THYROIDAB in the last 72 hours. Anemia Panel: Recent Labs    03/13/19 0432 03/14/19 0513  VITAMINB12  --  215  FOLATE  --  8.5  FERRITIN 173 161  TIBC  --  242*  IRON  --  58  RETICCTPCT  --  1.9   Sepsis Labs: Recent Labs  Lab 03/11/19 2130 03/12/19 0849  PROCALCITON 0.23 <0.10  LATICACIDVEN 1.6  --     Recent Results (from the past 240 hour(s))  Blood Culture (routine x 2)     Status: None (Preliminary result)   Collection Time: 03/11/19  9:34 PM   Specimen: BLOOD RIGHT FOREARM  Result Value Ref Range Status  Specimen Description BLOOD RIGHT FOREARM  Final   Special Requests   Final    BOTTLES DRAWN AEROBIC AND ANAEROBIC Blood Culture adequate volume   Culture   Final    NO GROWTH 4 DAYS Performed at [facility], [address], [place], [place] [address]    Report Status PENDING  Incomplete  Respiratory Panel by RT PCR (Flu A&B, Covid) - Nasopharyngeal Swab     Status: Abnormal   Collection Time: 03/12/19  2:42 AM   Specimen: Nasopharyngeal Swab  Result Value Ref Range Status   SARS Coronavirus 2 by RT PCR POSITIVE (A) NEGATIVE Final    Comment: RESULT CALLED TO, READ BACK BY AND VERIFIED WITH: Cindy Hazy RN 03/12/19 0338 JDW (NOTE) SARS-CoV-2 target nucleic acids are DETECTED. SARS-CoV-2 RNA is generally detectable in upper respiratory specimens  during the acute phase of infection. Positive results are indicative of the presence of the identified virus, but do not rule out bacterial infection or co-infection with other pathogens not detected by the test. Clinical correlation with patient history and other diagnostic information is necessary to determine patient infection status. The expected result is Negative. Fact Sheet for Patients:  PinkCheek.be Fact Sheet for Healthcare Providers: GravelBags.it This test is not yet approved or cleared by  the [place] FDA and  has been authorized for detection and/or diagnosis of SARS-CoV-2 by FDA under an Emergency Use Authorization (EUA).  This EUA will remain in effect (meaning this test can be used) for the  duration of  the COVID-19 declaration under Section 564(b)(1) of the Act, 21 U.S.C. section 360bbb-3(b)(1), unless the authorization is terminated or revoked sooner.    Influenza A by PCR NEGATIVE NEGATIVE Final   Influenza B by PCR NEGATIVE NEGATIVE Final    Comment: (NOTE) The Xpert Xpress SARS-CoV-2/FLU/RSV assay is intended as an aid in  the diagnosis of influenza from Nasopharyngeal swab specimens and  should not be used as a sole basis for treatment. Nasal washings and  aspirates are unacceptable for Xpert Xpress SARS-CoV-2/FLU/RSV  testing. Fact Sheet for Patients: PinkCheek.be Fact Sheet for Healthcare Providers: GravelBags.it This test is not yet approved or cleared by the [place] FDA and  has been authorized for detection and/or diagnosis of SARS-CoV-2 by  FDA under an Emergency Use Authorization (EUA). This EUA will remain  in effect (meaning this test can be used) for the duration of the  Covid-19 declaration under Section 564(b)(1) of the Act, 21  U.S.C. section 360bbb-3(b)(1), unless the authorization is  terminated or revoked. Performed at [facility], [place] [address], [place], [place] [address]   Blood Culture (routine x 2)     Status: None (Preliminary result)   Collection Time: 03/12/19  8:35 AM   Specimen: BLOOD  Result Value Ref Range Status   Specimen Description BLOOD LEFT ANTECUBITAL  Final   Special Requests   Final    BOTTLES DRAWN AEROBIC AND ANAEROBIC Blood Culture results may not be optimal due to an inadequate volume of blood received in culture bottles   Culture   Final    NO GROWTH 3 DAYS Performed at [facility], [place] [address] [place], [place] [address]     Report Status PENDING  Incomplete         Radiology Studies: ECHOCARDIOGRAM LIMITED  Result Date: 03/13/2019   ECHOCARDIOGRAM REPORT   Patient Name:   BIANCO DICENZO Date of Exam: 03/13/2019 Medical Rec #:  [id number]  Height:       65.0 in Accession #:    [id number]     Weight:       204.1 lb Date of Birth:  Feb 13, 1946     BSA:          2.00 m Patient Age:    60 years       BP:           105/49 mmHg Patient Gender: F              HR:           75 bpm. Exam Location:  Inpatient Procedure: Limited Echo, Limited Color Doppler and Cardiac Doppler Indications:    elevated troponin  History:        Patient has no prior history of Echocardiogram examinations.                 Covid.  Sonographer:    Johny Chess Referring PhysXG:[id number] [place]  1. Left ventricular ejection fraction, by visual estimation, is 65 to 70%. The left ventricle has hyperdynamic function. There is no left ventricular hypertrophy.  2. Left ventricular diastolic parameters are consistent with Grade I diastolic dysfunction (impaired relaxation).  3. The left ventricle has no regional wall motion abnormalities.  4. Global right ventricle has normal systolic function.The right ventricular size is normal. No increase in right ventricular wall thickness.  5. Left atrial size was normal.  6. Right atrial size was normal.  7. The mitral valve is normal in structure. Trivial mitral valve regurgitation. No evidence of mitral stenosis.  8. The tricuspid valve is normal in structure.  9. The aortic valve is normal in structure. Aortic valve regurgitation is not visualized. No evidence of aortic valve sclerosis or stenosis. 10. The pulmonic valve was normal in structure. Pulmonic valve regurgitation is not visualized. 11. Normal pulmonary artery systolic pressure. 12. The inferior vena cava is normal in size with greater than 50% respiratory variability, suggesting right atrial pressure of 3 mmHg. FINDINGS  Left Ventricle:  Left ventricular ejection fraction, by visual estimation, is 65 to 70%. The left ventricle has hyperdynamic function. The left ventricle has no regional wall motion abnormalities. There is no left ventricular hypertrophy. Left ventricular diastolic parameters are consistent with Grade I diastolic dysfunction (impaired relaxation). Normal left atrial pressure. Right Ventricle: The right ventricular size is normal. No increase in right ventricular wall thickness. Global RV systolic function is has normal systolic function. The tricuspid regurgitant velocity is 2.00 m/s, and with an assumed right atrial pressure  of 3 mmHg, the estimated right ventricular systolic pressure is normal at 19.0 mmHg. Left Atrium: Left atrial size was normal in size. Right Atrium: Right atrial size was normal in size Pericardium: There is no evidence of pericardial effusion. Mitral Valve: The mitral valve is normal in structure. Trivial mitral valve regurgitation. No evidence of mitral valve stenosis by observation. Tricuspid Valve: The tricuspid valve is normal in structure. Tricuspid valve regurgitation is trivial. Aortic Valve: The aortic valve is normal in structure. Aortic valve regurgitation is not visualized. The aortic valve is structurally normal, with no evidence of sclerosis or stenosis. Pulmonic Valve: The pulmonic valve was normal in structure. Pulmonic valve regurgitation is not visualized. Pulmonic regurgitation is not visualized. Aorta: The aortic root, ascending aorta and aortic arch are all structurally normal, with no evidence of dilitation or obstruction. Venous: The inferior vena cava is normal in size with greater than 50% respiratory variability,  suggesting right atrial pressure of 3 mmHg. IAS/Shunts: No atrial level shunt detected by color flow Doppler. There is no evidence of a patent foramen ovale. No ventricular septal defect is seen or detected. There is no evidence of an atrial septal defect.  LEFT VENTRICLE  PLAX 2D LVIDd:         4.20 cm  Diastology LVIDs:         2.40 cm  LV e' lateral:   10.70 cm/s LV PW:         1.10 cm  LV E/e' lateral: 11.2 LV IVS:        1.10 cm  LV e' medial:    9.03 cm/s LVOT diam:     1.90 cm  LV E/e' medial:  13.3 LV SV:         58 ml LV SV Index:   27.85 LVOT Area:     2.84 cm  LEFT ATRIUM         Index LA diam:    3.80 cm 1.90 cm/m  AORTIC VALVE LVOT Vmax:   120.00 cm/s LVOT Vmean:  72.200 cm/s LVOT VTI:    0.237 m  AORTA Ao Root diam: 2.70 cm Ao Asc diam:  3.20 cm MITRAL VALVE                         TRICUSPID VALVE MV Area (PHT): 2.42 cm              TR Peak grad:   16.0 mmHg MV PHT:        90.77 msec            TR Vmax:        200.00 cm/s MV Decel Time: 313 msec MV E velocity: 120.00 cm/s 103 cm/s  SHUNTS MV A velocity: 135.00 cm/s 70.3 cm/s Systemic VTI:  0.24 m MV E/A ratio:  0.89        1.5       Systemic Diam: 1.90 cm  Ena Dawley MD Electronically signed by Ena Dawley MD Signature Date/Time: 03/13/2019/4:53:47 PM    Final         Scheduled Meds: . aspirin EC  81 mg Oral Daily  . atorvastatin  40 mg Oral Daily  . carvedilol  12.5 mg Oral BID WC  . clopidogrel  75 mg Oral Daily  . dexamethasone (DECADRON) injection  6 mg Intravenous Daily  . DULoxetine  60 mg Oral Daily  . ezetimibe  10 mg Oral Daily  . gabapentin  300 mg Oral QHS  . insulin aspart  0-9 Units Subcutaneous TID WC  . lisinopril  20 mg Oral Daily  . magnesium oxide  200 mg Oral BID  . pantoprazole  40 mg Oral Daily  . vitamin B-12  100 mcg Oral Daily   Continuous Infusions: . cefTRIAXone (ROCEPHIN)  IV Stopped (03/14/19 2026)  . remdesivir 100 mg in NS 100 mL 100 mg (03/15/19 0923)     LOS: 3 days    Time spent: 35 minutes.     Elmarie Shiley, MD [facility]   If 7PM-7AM, please contact night-coverage [email or web address] Password [facility] 03/15/2019, 2:38 PM

## 2019-03-15 NOTE — Progress Notes (Signed)
Daily Progress note   Patient pleasant at bedside. Tolerated ambulation to chair with standby to one assist  well. Coreg held both times due to  HR below 60s  and BP

## 2019-03-15 NOTE — Progress Notes (Signed)
PT Note  With pt's husband hospitalized, pt with little support at home. Updating recommendation to HHPT.  [place] Pager [phone] Office [phone]

## 2019-03-16 LAB — BASIC METABOLIC PANEL
Anion gap: 9 (ref 5–15)
BUN: 14 mg/dL (ref 8–23)
CO2: 27 mmol/L (ref 22–32)
Calcium: 8 mg/dL — ABNORMAL LOW (ref 8.9–10.3)
Chloride: 101 mmol/L (ref 98–111)
Creatinine, Ser: 0.58 mg/dL (ref 0.44–1.00)
GFR calc Af Amer: >60 mL/min (ref >60)
GFR calc non Af Amer: >60 mL/min (ref >60)
Glucose, Bld: 155 mg/dL — ABNORMAL HIGH (ref 70–99)
Potassium: 3.9 mmol/L (ref 3.5–5.1)
Sodium: 137 mmol/L (ref 135–145)

## 2019-03-16 LAB — GLUCOSE, CAPILLARY
Glucose-Capillary: 127 mg/dL — ABNORMAL HIGH (ref 70–99)
Glucose-Capillary: 235 mg/dL — ABNORMAL HIGH (ref 70–99)
Glucose-Capillary: 253 mg/dL — ABNORMAL HIGH (ref 70–99)
Glucose-Capillary: 267 mg/dL — ABNORMAL HIGH (ref 70–99)
Glucose-Capillary: 104 mg/dL — ABNORMAL HIGH (ref 70–99)
Glucose-Capillary: 287 mg/dL — ABNORMAL HIGH (ref 70–99)
Glucose-Capillary: 98 mg/dL (ref 70–99)
Glucose-Capillary: 164 mg/dL — ABNORMAL HIGH (ref 70–99)
Glucose-Capillary: 115 mg/dL — ABNORMAL HIGH (ref 70–99)
Glucose-Capillary: 189 mg/dL — ABNORMAL HIGH (ref 70–99)

## 2019-03-16 LAB — MAGNESIUM: Magnesium: 1.7 mg/dL (ref 1.7–2.4)

## 2019-03-16 LAB — CBC
HCT: 34.1 % — ABNORMAL LOW (ref 36.0–46.0)
Hemoglobin: 10.6 g/dL — ABNORMAL LOW (ref 12.0–15.0)
MCH: 25.6 pg — ABNORMAL LOW (ref 26.0–34.0)
MCHC: 31.1 g/dL (ref 30.0–36.0)
MCV: 82.4 fL (ref 80.0–100.0)
Platelets: 510 10*3/uL — ABNORMAL HIGH (ref 150–400)
RBC: 4.14 MIL/uL (ref 3.87–5.11)
RDW: 14.7 % (ref 11.5–15.5)
WBC: 12.9 10*3/uL — ABNORMAL HIGH (ref 4.0–10.5)
nRBC: 0.2 % (ref 0.0–0.2)

## 2019-03-16 LAB — CULTURE, BLOOD (ROUTINE X 2)
Culture: NO GROWTH
Special Requests: ADEQUATE

## 2019-03-16 LAB — URINE CULTURE: Culture: NO GROWTH

## 2019-03-16 MED ORDER — DEXAMETHASONE 6 MG PO TABS: 6.0000 mg | ORAL_TABLET | Freq: Every day | ORAL | 0 refills | Status: AC

## 2019-03-16 NOTE — Progress Notes (Signed)
Upon chart review, CSW notes PT is recommending patient have oxygen at home, however no order was placed. CSW contacted RN, Merrily Pew, who reports patient did not meet criteria when he tested patient. Per MD, they were under the impression patient already had oxygen set up at home. CSW contacted patient. She reports she has access to oxygen (would not supply company name) and that she did not need any set up but appreciated CSW.   Percell Locus Nicha Hemann LCSW [phone]

## 2019-03-16 NOTE — TOC Initial Note (Signed)
Transition of Care [facility]) - Initial/Assessment Note    Patient Details  Name: Kathryn Bruce MRN: [id number] Date of Birth: [birth year]  [facility]) CM/SW Contact:    Benard Halsted, LCSW Phone Number: 03/16/2019, 9:48 AM  Clinical Narrative:                 CSW received consult for possible home health services at time of discharge. CSW spoke with patient regarding PT recommendation of Home Health PT at time of discharge. Patient reported that she would like home health services if it is recommended.  Patient reports preference for [facility] since her significant other has used them before and liked their care. CSW sent referral for review and it has been accepted. CSW provided Medicare [facility] ratings list. No equipment recommendations noted. CSW confirmed PCP and address with patient. No further questions reported at this time.    Expected Discharge Plan: [place] Barriers to Discharge: No Barriers Identified   Patient Goals and CMS Choice Patient states their goals for this hospitalization and ongoing recovery are:: Return home CMS Medicare.gov Compare Post Acute Care list provided to:: Patient Choice offered to / list presented to : Patient  Expected Discharge Plan and Services Expected Discharge Plan: [place]   Discharge Planning Services: CM Consult Post Acute Care Choice: [place] arrangements for the past 2 months: Single Family Home                 DME Arranged: N/A         HH Arranged: PT, Nurse's Aide HH Agency: [place] Date [facility] Agency Contacted: 03/16/19 Time [place] Agency Contacted: [phone] Representative spoke with at [place]: Georgina Snell  Prior Living Arrangements/Services Living arrangements for the past 2 months: [place] Lives with:: Significant Other Patient language and need for interpreter reviewed:: Yes Do you feel safe going back to the place where you live?: Yes      Need for  Family Participation in Patient Care: No (Comment) Care giver support system in place?: Yes (comment)   Criminal Activity/Legal Involvement Pertinent to Current Situation/Hospitalization: No - Comment as needed  Activities of Daily Living Home Assistive Devices/Equipment: None ADL Screening (condition at time of admission) Patient's cognitive ability adequate to safely complete daily activities?: Yes Is the patient deaf or have difficulty hearing?: No Does the patient have difficulty seeing, even when wearing glasses/contacts?: No Does the patient have difficulty concentrating, remembering, or making decisions?: No Patient able to express need for assistance with ADLs?: Yes Does the patient have difficulty dressing or bathing?: No Independently performs ADLs?: Yes (appropriate for developmental age) Does the patient have difficulty walking or climbing stairs?: Yes Weakness of Legs: Both Weakness of Arms/Hands: None  Permission Sought/Granted Permission sought to share information with : Facility Art therapist granted to share information with : Yes, Verbal Permission Granted     Permission granted to share info w AGENCY: [place]        Emotional Assessment Appearance:: Appears stated age Attitude/Demeanor/Rapport: Engaged Affect (typically observed): Accepting, Appropriate Orientation: : Oriented to Self, Oriented to Place, Oriented to  Time, Oriented to Situation Alcohol / Substance Use: Not Applicable Psych Involvement: No (comment)  Admission diagnosis:  Acute respiratory failure due to COVID-19 (HCC) [U07.1, J96.00] Acute respiratory failure with hypoxia ([place]) [J96.01] Patient Active Problem List   Diagnosis Date Noted  . Acute respiratory failure due to COVID-19 ([place]) 03/12/2019  .  Acute respiratory failure with hypoxia ([place]) 03/12/2019  . Aortic atherosclerosis ([place]) 10/05/2017  . Mixed hyperlipidemia 10/05/2017  . Obesity (BMI 30-39.9) 10/05/2017   . Type 2 diabetes mellitus with obesity ([place]) 10/05/2017  . CAD (coronary artery disease), native coronary artery 10/05/2017  . Hypertension 09/05/2011  . Tobacco use 09/05/2011   PCP:  Maurice Small, MD Pharmacy:   [facility], [place] [place] [address] Phone: [phone] Fax: [phone]     Social Determinants of Health (SDOH) Interventions    Readmission Risk Interventions No flowsheet data found.

## 2019-03-16 NOTE — Progress Notes (Signed)
SATURATION QUALIFICATIONS: (This note is used to comply with regulatory documentation for home oxygen)  Patient Saturations on Room Air at Rest = 90%  Patient Saturations on Room Air while Ambulating = 84%  Patient Saturations on 3 Liters of oxygen while Ambulating = 90%  Please briefly explain why patient needs home oxygen:Pt needed 3LO2 to maintain sats above 90% with activity.  Jaylena Holloway W,PT Acute Rehabilitation Services Pager:  [phone]  Office:  [phone]

## 2019-03-16 NOTE — Discharge Summary (Signed)
Physician Discharge Summary  [name] [id number] DOB: 1945-12-28 DOA: 03/11/2019  PCP: Maurice Small, MD  Admit date: 03/11/2019 Discharge date: 03/16/2019  Admitted From: Home Disposition: Home with [facility] PT Recommendations for Outpatient Follow-up:  1. Follow up with PCP in 1-2 weeks 2. Please obtain BMP/CBC in one week 3. Please follow up on the following pending results:  Home Health: Yes Equipment/Devices:Pt reports that she already has O2 supplies at home Discharge Condition: Stable CODE STATUS: Full code Diet recommendation: Heart Healthy and carb mod diet Brief/Interim Summary: 74 year old with past medical history significant for CAD status post stenting, diabetes type 2, hypertension presented to ER with increasing shortness of breath.  Patient was diagnosed with Covid infection 10 days prior to admission.  She reports generalized body aches and flulike symptoms.  Over the last 2 days prior to admission she developed increasing shortness of breath and was found to have low oxygen.  Patient in the ED patient was found to be hypoxic requiring 3 L of oxygen, chest x-ray showed bilateral infiltrates.  EKG normal sinus rhythm no ST elevation.  Troponin 720---2107.  Angio negative for PE.  Patient was a started on heparin drip, cardiology has been consulted.  Discharge Diagnoses:  Principal Problem:   Acute respiratory failure due to COVID-19 [facility]) Active Problems:   Type 2 diabetes mellitus with obesity (HCC)   CAD (coronary artery disease), native coronary artery   Acute respiratory failure with hypoxia (HCC)  1-Acute Hypoxic Respiratory Failure secondary to Covid 19 PNA;  -CT angio negative for PE, bilateral ground-glass opacities.  -74 Presented with Dyspnea, New onset hypoxemia sat 80 %, currently on 2 L/min [place] -Completed Remdesivir treatment, will have 5 more days of Dexamethasone.   -Inflammatory Markers trending down, stable.  She will completed Remdesivir on 1-06 She  will continue 2L O2 supplementation via [place] at home, she reported that she already had O2 supplies at home.   COVID-19 Labs 2-Demand ischemia in setting of covid PNA Troponin 2000, range, EKG no ST elevation.  Treated  with Heparin Gtt. for 48-hour Continue with statins, BB, aspirin, Plavix.  Cardiology recommend conservative management, unless patient developed chest pain or EKG changes during this admission which she did not have.  Patient will need outpatient follow-up for stress test once she fully recovers from COVID-19 infection.  ECHO; normal EF, no wall motion abnormalities. See below for full report.  Per cardiology patient had demand ischemia from  Covid. Plan for out patient follow up with stress test.   3-Hypokalema; Resolved  4-Hypomagnesemia; mild, s/p repletion.   5-Diabetes Mellitus type 2;  Was treated with SSI.  Resume glipizide.   6-HTN; continue with carvedilol and lisinopril.  Resume home HCTZ.   7-Normocytic anemia;  Low hemoglobin trend. B 12 low normal range. Will need PCP follow up and decided on need for long-term supplementation.   UTI; UA with 21-50 WBC. Was treated with Rocephin x4 days. Urine culture with no growth.    Physical debility.  Likely exacerbated by her acute illness. Was seen by PT with recommendation for [facility] PT which was ordered.    Estimated body mass index is 33.97 kg/m as calculated from the following:   Height as of this encounter: 5\' 5"  (1.651 m).   Weight as of this encounter: 92.6 kg.  Discharge Instructions  Discharge Instructions    Diet - low sodium heart healthy   Complete by: As directed    Face-to-face encounter (required for Medicare/Medicaid patients)  Complete by: As directed    I Blain Pais certify that this patient is under my care and that I, or a nurse practitioner or physician's assistant working with me, had a face-to-face encounter that meets the physician face-to-face encounter requirements  with this patient on 03/16/2019. The encounter with the patient was in whole, or in part for the following medical condition(s) which is the primary reason for home health care (List medical condition): physical debility, COVID-19 pneumonia   The encounter with the patient was in whole, or in part, for the following medical condition, which is the primary reason for home health care: physical debility   I certify that, based on my findings, the following services are medically necessary home health services: Physical therapy   Reason for Medically Necessary Home Health Services: Therapy- Personnel officer, Training and development officer and Stair Training   My clinical findings support the need for the above services: Unable to leave home safely without assistance and/or assistive device   Further, I certify that my clinical findings support that this patient is homebound due to: Shortness of Breath with activity   Home Health   Complete by: As directed    To provide the following care/treatments: PT   Increase activity slowly   Complete by: As directed    MyChart COVID-19 home monitoring program   Complete by: Mar 16, 2019    Is the patient willing to use the [place] for home monitoring?: No     Allergies as of 03/16/2019   No Known Allergies     Medication List    TAKE these medications   acetaminophen 500 MG tablet Commonly known as: TYLENOL Take 1,000 mg by mouth every 8 (eight) hours as needed (pain).   aspirin 81 MG EC tablet Take 1 tablet (81 mg total) by mouth daily.   atorvastatin 40 MG tablet Commonly known as: LIPITOR Take 40 mg by mouth daily.   carvedilol 12.5 MG tablet Commonly known as: COREG Take 1 tablet (12.5 mg total) by mouth 2 (two) times daily with a meal.   clopidogrel 75 MG tablet Commonly known as: PLAVIX Take 2 tablets (150 mg) daily for the first 2 days then take 1 tablet (75 mg) daily. What changed:   how much to take  how to take this  when to take  this  additional instructions   Cymbalta 30 MG capsule Generic drug: DULoxetine Take 60 mg by mouth daily.   dexamethasone 6 MG tablet Commonly known as: DECADRON Take 1 tablet (6 mg total) by mouth daily for 5 days.   ezetimibe 10 MG tablet Commonly known as: ZETIA Take 1 tablet (10 mg total) by mouth daily.   gabapentin 100 MG capsule Commonly known as: NEURONTIN Take 300 mg by mouth at bedtime.   glipiZIDE 5 MG 24 hr tablet Commonly known as: GLUCOTROL XL Take 5 mg by mouth daily.   lisinopril-hydrochlorothiazide 20-25 MG tablet Commonly known as: ZESTORETIC Take 1 tablet by mouth daily.   metFORMIN 500 MG tablet Commonly known as: GLUCOPHAGE Take 1,000 mg by mouth 2 (two) times daily with a meal.   nitroGLYCERIN 0.4 MG SL tablet Commonly known as: NITROSTAT Place 1 tablet (0.4 mg total) under the tongue every 5 (five) minutes x 3 doses as needed for chest pain.   omeprazole 40 MG capsule Commonly known as: PRILOSEC Take 40 mg by mouth daily.   Vitamin D (Ergocalciferol) 1.25 MG (50000 UT) Caps capsule Commonly known as: DRISDOL  Take 50,000 Units by mouth every 7 (seven) days. Mondays            Durable Medical Equipment  (From admission, onward)         Start     Ordered   03/15/19 1806  DME 3-in-1  (Discharge Planning)  Once     03/15/19 1805         Follow-up Information    Buford Dresser, MD Follow up on 04/01/2019.   Specialty: Cardiology Why: @ 8am for virtual hospital follow up  Contact information: [address] [place] [address] [phone]        [facility], [facility] Follow up.   Specialty: Home Health Services Why: Home Health PT/aide services arranged. They will contact you about 48 hours after discharge to schedule visit.  Contact information: [address] [place] [address] [phone]        Maurice Small, MD. Schedule an appointment as soon as possible for a visit in 2  day(s).   Specialty: Family Medicine Contact information: [address] [place] [address] [phone]          No Known Allergies  Consultations:  Cardiology    Procedures/Studies: CT Angio Chest PE W and/or Wo Contrast  Result Date: 03/12/2019 CLINICAL DATA:  Shortness of breath.  COVID-19 positive 10 days ago. EXAM: CT ANGIOGRAPHY CHEST WITH CONTRAST TECHNIQUE: Multidetector CT imaging of the chest was performed using the standard protocol during bolus administration of intravenous contrast. Multiplanar CT image reconstructions and MIPs were obtained to evaluate the vascular anatomy. CONTRAST:  164mL OMNIPAQUE IOHEXOL 350 MG/ML SOLN COMPARISON:  Radiograph yesterday. Chest CT 10/07/2018 FINDINGS: Cardiovascular: There are no filling defects within the pulmonary arteries to suggest pulmonary embolus. Atherosclerosis of the thoracic aorta. No aortic aneurysm. Cannot assess for dissection given phase of contrast tailored to pulmonary artery evaluation. Heart is normal in size. No pericardial effusion. Mediastinum/Nodes: Small mediastinal and hilar lymph nodes, largest right paratracheal measuring 15 mm. Lymph nodes only slightly larger than on prior chest CT. No visualized thyroid nodule. Decompressed esophagus. Lungs/Pleura: Bilateral ground-glass and consolidative airspace opacities throughout both lungs, most prominent in the lower lobes. No significant pleural fluid. Trachea and mainstem bronchi are patent. Upper Abdomen: No acute findings. Musculoskeletal: There are no acute or suspicious osseous abnormalities. Review of the MIP images confirms the above findings. IMPRESSION: 1. No pulmonary embolus. 2. Bilateral ground-glass and consolidative airspace opacities throughout both lungs consistent with pneumonia, pattern typical of COVID-19. 3. Mild mediastinal and hilar adenopathy, chronic with nodes only slightly increased from prior chest CT, considered  benign/reactive. Aortic Atherosclerosis (ICD10-I70.0). Electronically Signed   By: Keith Rake M.D.   On: 03/12/2019 04:19   DG Chest Portable 1 View  Result Date: 03/11/2019 CLINICAL DATA:  Viral pneumonia EXAM: PORTABLE CHEST 1 VIEW COMPARISON:  January 15, 2018 FINDINGS: There are scattered pulmonary opacities bilaterally. There is some consolidation at the left lung base. The heart size is unchanged from prior study. There are aortic calcifications. There is no pneumothorax. No significant pleural effusion. IMPRESSION: Scattered bilateral pulmonary opacities consistent with the patient's history of viral pneumonia. Electronically Signed   By: Constance Holster M.D.   On: 03/11/2019 19:12   ECHOCARDIOGRAM LIMITED  Result Date: 03/13/2019   ECHOCARDIOGRAM REPORT   Patient Name:   Kathryn Bruce Date of Exam: 03/13/2019 Medical Rec #:  [id number]      Height:  65.0 in Accession #:    [id number]     Weight:       204.1 lb Date of Birth:  1945/08/11     BSA:          2.00 m Patient Age:    29 years       BP:           105/49 mmHg Patient Gender: F              HR:           75 bpm. Exam Location:  Inpatient Procedure: Limited Echo, Limited Color Doppler and Cardiac Doppler Indications:    elevated troponin  History:        Patient has no prior history of Echocardiogram examinations.                 Covid.  Sonographer:    Johny Chess Referring PhysXG:[id number] [place]  1. Left ventricular ejection fraction, by visual estimation, is 65 to 70%. The left ventricle has hyperdynamic function. There is no left ventricular hypertrophy.  2. Left ventricular diastolic parameters are consistent with Grade I diastolic dysfunction (impaired relaxation).  3. The left ventricle has no regional wall motion abnormalities.  4. Global right ventricle has normal systolic function.The right ventricular size is normal. No increase in right ventricular wall thickness.  5. Left atrial size was  normal.  6. Right atrial size was normal.  7. The mitral valve is normal in structure. Trivial mitral valve regurgitation. No evidence of mitral stenosis.  8. The tricuspid valve is normal in structure.  9. The aortic valve is normal in structure. Aortic valve regurgitation is not visualized. No evidence of aortic valve sclerosis or stenosis. 10. The pulmonic valve was normal in structure. Pulmonic valve regurgitation is not visualized. 11. Normal pulmonary artery systolic pressure. 12. The inferior vena cava is normal in size with greater than 50% respiratory variability, suggesting right atrial pressure of 3 mmHg. FINDINGS  Left Ventricle: Left ventricular ejection fraction, by visual estimation, is 65 to 70%. The left ventricle has hyperdynamic function. The left ventricle has no regional wall motion abnormalities. There is no left ventricular hypertrophy. Left ventricular diastolic parameters are consistent with Grade I diastolic dysfunction (impaired relaxation). Normal left atrial pressure. Right Ventricle: The right ventricular size is normal. No increase in right ventricular wall thickness. Global RV systolic function is has normal systolic function. The tricuspid regurgitant velocity is 2.00 m/s, and with an assumed right atrial pressure  of 3 mmHg, the estimated right ventricular systolic pressure is normal at 19.0 mmHg. Left Atrium: Left atrial size was normal in size. Right Atrium: Right atrial size was normal in size Pericardium: There is no evidence of pericardial effusion. Mitral Valve: The mitral valve is normal in structure. Trivial mitral valve regurgitation. No evidence of mitral valve stenosis by observation. Tricuspid Valve: The tricuspid valve is normal in structure. Tricuspid valve regurgitation is trivial. Aortic Valve: The aortic valve is normal in structure. Aortic valve regurgitation is not visualized. The aortic valve is structurally normal, with no evidence of sclerosis or stenosis.  Pulmonic Valve: The pulmonic valve was normal in structure. Pulmonic valve regurgitation is not visualized. Pulmonic regurgitation is not visualized. Aorta: The aortic root, ascending aorta and aortic arch are all structurally normal, with no evidence of dilitation or obstruction. Venous: The inferior vena cava is normal in size with greater than 50% respiratory variability, suggesting right atrial pressure of 3  mmHg. IAS/Shunts: No atrial level shunt detected by color flow Doppler. There is no evidence of a patent foramen ovale. No ventricular septal defect is seen or detected. There is no evidence of an atrial septal defect.  LEFT VENTRICLE PLAX 2D LVIDd:         4.20 cm  Diastology LVIDs:         2.40 cm  LV e' lateral:   10.70 cm/s LV PW:         1.10 cm  LV E/e' lateral: 11.2 LV IVS:        1.10 cm  LV e' medial:    9.03 cm/s LVOT diam:     1.90 cm  LV E/e' medial:  13.3 LV SV:         58 ml LV SV Index:   27.85 LVOT Area:     2.84 cm  LEFT ATRIUM         Index LA diam:    3.80 cm 1.90 cm/m  AORTIC VALVE LVOT Vmax:   120.00 cm/s LVOT Vmean:  72.200 cm/s LVOT VTI:    0.237 m  AORTA Ao Root diam: 2.70 cm Ao Asc diam:  3.20 cm MITRAL VALVE                         TRICUSPID VALVE MV Area (PHT): 2.42 cm              TR Peak grad:   16.0 mmHg MV PHT:        90.77 msec            TR Vmax:        200.00 cm/s MV Decel Time: 313 msec MV E velocity: 120.00 cm/s 103 cm/s  SHUNTS MV A velocity: 135.00 cm/s 70.3 cm/s Systemic VTI:  0.24 m MV E/A ratio:  0.89        1.5       Systemic Diam: 1.90 cm  Ena Dawley MD Electronically signed by Ena Dawley MD Signature Date/Time: 03/13/2019/4:53:47 PM    Final      ECHOCARDIOGRAM LIMITED  Result Date: 03/13/2019   ECHOCARDIOGRAM REPORT   Patient Name:   Kathryn Bruce Date of Exam: 03/13/2019 Medical Rec #:  [id number]      Height:       65.0 in Accession #:    [id number]     Weight:       204.1 lb Date of Birth:  1946-02-16     BSA:          2.00 m Patient Age:    21  years       BP:           105/49 mmHg Patient Gender: F              HR:           75 bpm. Exam Location:  Inpatient Procedure: Limited Echo, Limited Color Doppler and Cardiac Doppler Indications:    elevated troponin  History:        Patient has no prior history of Echocardiogram examinations.                 Covid.  Sonographer:    Johny Chess Referring PhysXG:[id number] [place]  1. Left ventricular ejection fraction, by visual estimation, is 65 to 70%. The left ventricle has hyperdynamic function. There is no left ventricular hypertrophy.  2. Left ventricular diastolic parameters  are consistent with Grade I diastolic dysfunction (impaired relaxation).  3. The left ventricle has no regional wall motion abnormalities.  4. Global right ventricle has normal systolic function.The right ventricular size is normal. No increase in right ventricular wall thickness.  5. Left atrial size was normal.  6. Right atrial size was normal.  7. The mitral valve is normal in structure. Trivial mitral valve regurgitation. No evidence of mitral stenosis.  8. The tricuspid valve is normal in structure.  9. The aortic valve is normal in structure. Aortic valve regurgitation is not visualized. No evidence of aortic valve sclerosis or stenosis. 10. The pulmonic valve was normal in structure. Pulmonic valve regurgitation is not visualized. 11. Normal pulmonary artery systolic pressure. 12. The inferior vena cava is normal in size with greater than 50% respiratory variability, suggesting right atrial pressure of 3 mmHg. FINDINGS  Left Ventricle: Left ventricular ejection fraction, by visual estimation, is 65 to 70%. The left ventricle has hyperdynamic function. The left ventricle has no regional wall motion abnormalities. There is no left ventricular hypertrophy. Left ventricular diastolic parameters are consistent with Grade I diastolic dysfunction (impaired relaxation). Normal left atrial pressure. Right Ventricle:  The right ventricular size is normal. No increase in right ventricular wall thickness. Global RV systolic function is has normal systolic function. The tricuspid regurgitant velocity is 2.00 m/s, and with an assumed right atrial pressure  of 3 mmHg, the estimated right ventricular systolic pressure is normal at 19.0 mmHg. Left Atrium: Left atrial size was normal in size. Right Atrium: Right atrial size was normal in size Pericardium: There is no evidence of pericardial effusion. Mitral Valve: The mitral valve is normal in structure. Trivial mitral valve regurgitation. No evidence of mitral valve stenosis by observation. Tricuspid Valve: The tricuspid valve is normal in structure. Tricuspid valve regurgitation is trivial. Aortic Valve: The aortic valve is normal in structure. Aortic valve regurgitation is not visualized. The aortic valve is structurally normal, with no evidence of sclerosis or stenosis. Pulmonic Valve: The pulmonic valve was normal in structure. Pulmonic valve regurgitation is not visualized. Pulmonic regurgitation is not visualized. Aorta: The aortic root, ascending aorta and aortic arch are all structurally normal, with no evidence of dilitation or obstruction. Venous: The inferior vena cava is normal in size with greater than 50% respiratory variability, suggesting right atrial pressure of 3 mmHg. IAS/Shunts: No atrial level shunt detected by color flow Doppler. There is no evidence of a patent foramen ovale. No ventricular septal defect is seen or detected. There is no evidence of an atrial septal defect.  LEFT VENTRICLE PLAX 2D LVIDd:         4.20 cm  Diastology LVIDs:         2.40 cm  LV e' lateral:   10.70 cm/s LV PW:         1.10 cm  LV E/e' lateral: 11.2 LV IVS:        1.10 cm  LV e' medial:    9.03 cm/s LVOT diam:     1.90 cm  LV E/e' medial:  13.3 LV SV:         58 ml LV SV Index:   27.85 LVOT Area:     2.84 cm  LEFT ATRIUM         Index LA diam:    3.80 cm 1.90 cm/m  AORTIC VALVE LVOT  Vmax:   120.00 cm/s LVOT Vmean:  72.200 cm/s LVOT VTI:    0.237 m  AORTA  Ao Root diam: 2.70 cm Ao Asc diam:  3.20 cm MITRAL VALVE                         TRICUSPID VALVE MV Area (PHT): 2.42 cm              TR Peak grad:   16.0 mmHg MV PHT:        90.77 msec            TR Vmax:        200.00 cm/s MV Decel Time: 313 msec MV E velocity: 120.00 cm/s 103 cm/s  SHUNTS MV A velocity: 135.00 cm/s 70.3 cm/s Systemic VTI:  0.24 m MV E/A ratio:  0.89        1.5       Systemic Diam: 1.90 cm  Ena Dawley MD Electronically signed by Ena Dawley MD Signature Date/Time: 03/13/2019/4:53:47 PM    Final         Subjective: Patient reports feeling better. She remains on her baseline O2 supplementation of 2L with stable O2 sats. Her appetite is improving.   Discharge Exam: Vitals:   03/16/19 0519 03/16/19 0735  BP: (!) 148/70 134/61  Pulse: 62 63  Resp:  (!) 22  Temp: 98.6 F (37 C) 98.2 F (36.8 C)  SpO2: (!) 88% 91%   Vitals:   03/15/19 1608 03/15/19 1929 03/16/19 0519 03/16/19 0735  BP: (!) 127/50 106/86 (!) 148/70 134/61  Pulse: 65 63 62 63  Resp: 18   (!) 22  Temp: 97.9 F (36.6 C) 98.2 F (36.8 C) 98.6 F (37 C) 98.2 F (36.8 C)  TempSrc:  Oral Oral Oral  SpO2: 94% 90% (!) 88% 91%  Weight:      Height:        General: Pt is alert, awake, not in acute distress Cardiovascular: RRR, S1/S2  Respiratory: no respiratory distress, no wheezing Abdominal: Soft, NT, ND Extremities: no edema, no cyanosis    The results of significant diagnostics from this hospitalization (including imaging, microbiology, ancillary and laboratory) are listed below for reference.     Microbiology: Recent Results (from the past 240 hour(s))  Blood Culture (routine x 2)     Status: None (Preliminary result)   Collection Time: 03/11/19  9:34 PM   Specimen: BLOOD RIGHT FOREARM  Result Value Ref Range Status   Specimen Description BLOOD RIGHT FOREARM  Final   Special Requests   Final    BOTTLES DRAWN  AEROBIC AND ANAEROBIC Blood Culture adequate volume   Culture   Final    NO GROWTH 4 DAYS Performed at [facility], [address] [place], [place] [address]    Report Status PENDING  Incomplete  Respiratory Panel by RT PCR (Flu A&B, Covid) - Nasopharyngeal Swab     Status: Abnormal   Collection Time: 03/12/19  2:42 AM   Specimen: Nasopharyngeal Swab  Result Value Ref Range Status   SARS Coronavirus 2 by RT PCR POSITIVE (A) NEGATIVE Final    Comment: RESULT CALLED TO, READ BACK BY AND VERIFIED WITH: Cindy Hazy RN 03/12/19 0338 JDW (NOTE) SARS-CoV-2 target nucleic acids are DETECTED. SARS-CoV-2 RNA is generally detectable in upper respiratory specimens  during the acute phase of infection. Positive results are indicative of the presence of the identified virus, but do not rule out bacterial infection or co-infection with other pathogens not detected by the test. Clinical correlation with patient history and other diagnostic  information is necessary to determine patient infection status. The expected result is Negative. Fact Sheet for Patients:  PinkCheek.be Fact Sheet for Healthcare Providers: GravelBags.it This test is not yet approved or cleared by the [place] FDA and  has been authorized for detection and/or diagnosis of SARS-CoV-2 by FDA under an Emergency Use Authorization (EUA).  This EUA will remain in effect (meaning this test can be used) for the  duration of  the COVID-19 declaration under Section 564(b)(1) of the Act, 21 U.S.C. section 360bbb-3(b)(1), unless the authorization is terminated or revoked sooner.    Influenza A by PCR NEGATIVE NEGATIVE Final   Influenza B by PCR NEGATIVE NEGATIVE Final    Comment: (NOTE) The Xpert Xpress SARS-CoV-2/FLU/RSV assay is intended as an aid in  the diagnosis of influenza from Nasopharyngeal swab specimens and  should not be used as a sole basis for treatment.  Nasal washings and  aspirates are unacceptable for Xpert Xpress SARS-CoV-2/FLU/RSV  testing. Fact Sheet for Patients: PinkCheek.be Fact Sheet for Healthcare Providers: GravelBags.it This test is not yet approved or cleared by the [place] FDA and  has been authorized for detection and/or diagnosis of SARS-CoV-2 by  FDA under an Emergency Use Authorization (EUA). This EUA will remain  in effect (meaning this test can be used) for the duration of the  Covid-19 declaration under Section 564(b)(1) of the Act, 21  U.S.C. section 360bbb-3(b)(1), unless the authorization is  terminated or revoked. Performed at [facility], [address] [place], [place] [address]   Blood Culture (routine x 2)     Status: None (Preliminary result)   Collection Time: 03/12/19  8:35 AM   Specimen: BLOOD  Result Value Ref Range Status   Specimen Description BLOOD LEFT ANTECUBITAL  Final   Special Requests   Final    BOTTLES DRAWN AEROBIC AND ANAEROBIC Blood Culture results may not be optimal due to an inadequate volume of blood received in culture bottles   Culture   Final    NO GROWTH 3 DAYS Performed at [facility], [place] [address], [place], [place] [address]    Report Status PENDING  Incomplete  Urine culture     Status: None   Collection Time: 03/15/19  3:09 PM   Specimen: Urine, Clean Catch  Result Value Ref Range Status   Specimen Description URINE, CLEAN CATCH  Final   Special Requests NONE  Final   Culture   Final    NO GROWTH Performed at [facility], [place] [address], [place], [place] [address]    Report Status 03/16/2019 FINAL  Final     Labs: BNP (last 3 results) Recent Labs    03/12/19 0849  BNP [id number]   Basic Metabolic Panel: Recent Labs  Lab 03/12/19 0849 03/12/19 0950 03/13/19 0432 03/14/19 0513 03/15/19 0657 03/16/19 0422  NA 138  --  141 141 140 137  K 3.2*  --  3.5 3.6 3.6 3.9  CL 98  --   103 105 104 101  CO2 27  --  26 27 26 27   GLUCOSE 177*  --  131* 126* 111* 155*  BUN 13  --  15 19 14 14   CREATININE 0.71  --  0.64 0.59 0.58 0.58  CALCIUM 8.5*  --  8.6* 8.4* 8.1* 8.0*  MG  --  1.2*  --  1.4*  --  1.7   Liver Function Tests: Recent Labs  Lab 03/12/19 0849 03/13/19 0432 03/15/19  0657  AST 21 18 36  ALT 19 19 31   ALKPHOS 50 55 53  BILITOT 0.7 0.4 0.4  PROT 6.3* 6.5 5.4*  ALBUMIN 2.4* 2.4* 2.1*   No results for input(s): LIPASE, AMYLASE in the last 168 hours. No results for input(s): AMMONIA in the last 168 hours. CBC: Recent Labs  Lab 03/12/19 0849 03/13/19 0432 03/14/19 0513 03/15/19 0657 03/16/19 0422  WBC 8.2 10.7* 10.4 12.6* 12.9*  NEUTROABS 5.0 7.1  --   --   --   HGB 10.9* 11.6* 10.2* 10.7* 10.6*  HCT 34.9* 35.9* 32.4* 34.1* 34.1*  MCV 82.5 81.0 81.4 82.2 82.4  PLT 422* 521* 492* 475* 510*   Cardiac Enzymes: No results for input(s): CKTOTAL, CKMB, CKMBINDEX, TROPONINI in the last 168 hours. BNP: Invalid input(s): POCBNP CBG: Recent Labs  Lab 03/14/19 1200 03/14/19 1606 03/15/19 1617 03/15/19 2104 03/16/19 0732  GLUCAP 176* 247* 302* 283* 115*   D-Dimer No results for input(s): DDIMER in the last 72 hours. Hgb A1c No results for input(s): HGBA1C in the last 72 hours. Lipid Profile No results for input(s): CHOL, HDL, LDLCALC, TRIG, CHOLHDL, LDLDIRECT in the last 72 hours. Thyroid function studies No results for input(s): TSH, T4TOTAL, T3FREE, THYROIDAB in the last 72 hours.  Invalid input(s): FREET3 Anemia work up Recent Labs    03/14/19 0513  VITAMINB12 215  FOLATE 8.5  FERRITIN 161  TIBC 242*  IRON 58  RETICCTPCT 1.9   Urinalysis    Component Value Date/Time   COLORURINE YELLOW 03/13/2019 1326   APPEARANCEUR HAZY (A) 03/13/2019 1326   LABSPEC 1.025 03/13/2019 1326   PHURINE 5.0 03/13/2019 1326   GLUCOSEU 50 (A) 03/13/2019 1326   HGBUR NEGATIVE 03/13/2019 1326   [place] 03/13/2019 1326   [place] 03/13/2019 1326   PROTEINUR NEGATIVE 03/13/2019 1326   NITRITE NEGATIVE 03/13/2019 1326   LEUKOCYTESUR SMALL (A) 03/13/2019 1326   Sepsis Labs Invalid input(s): PROCALCITONIN,  WBC,  LACTICIDVEN Microbiology Recent Results (from the past 240 hour(s))  Blood Culture (routine x 2)     Status: None (Preliminary result)   Collection Time: 03/11/19  9:34 PM   Specimen: BLOOD RIGHT FOREARM  Result Value Ref Range Status   Specimen Description BLOOD RIGHT FOREARM  Final   Special Requests   Final    BOTTLES DRAWN AEROBIC AND ANAEROBIC Blood Culture adequate volume   Culture   Final    NO GROWTH 4 DAYS Performed at [facility], [place] [address], [place], [place] [address]    Report Status PENDING  Incomplete  Respiratory Panel by RT PCR (Flu A&B, Covid) - Nasopharyngeal Swab     Status: Abnormal   Collection Time: 03/12/19  2:42 AM   Specimen: Nasopharyngeal Swab  Result Value Ref Range Status   SARS Coronavirus 2 by RT PCR POSITIVE (A) NEGATIVE Final    Comment: RESULT CALLED TO, READ BACK BY AND VERIFIED WITH: Cindy Hazy RN 03/12/19 0338 JDW (NOTE) SARS-CoV-2 target nucleic acids are DETECTED. SARS-CoV-2 RNA is generally detectable in upper respiratory specimens  during the acute phase of infection. Positive results are indicative of the presence of the identified virus, but do not rule out bacterial infection or co-infection with other pathogens not detected by the test. Clinical correlation with patient history and other diagnostic information is necessary to determine patient infection status. The expected result is Negative. Fact Sheet for Patients:  PinkCheek.be Fact Sheet for Healthcare Providers: GravelBags.it This test is not  yet approved or cleared by the [place] and  has been authorized for detection and/or diagnosis of SARS-CoV-2 by FDA under an Emergency Use Authorization (EUA).  This EUA  will remain in effect (meaning this test can be used) for the  duration of  the COVID-19 declaration under Section 564(b)(1) of the Act, 21 U.S.C. section 360bbb-3(b)(1), unless the authorization is terminated or revoked sooner.    Influenza A by PCR NEGATIVE NEGATIVE Final   Influenza B by PCR NEGATIVE NEGATIVE Final    Comment: (NOTE) The Xpert Xpress SARS-CoV-2/FLU/RSV assay is intended as an aid in  the diagnosis of influenza from Nasopharyngeal swab specimens and  should not be used as a sole basis for treatment. Nasal washings and  aspirates are unacceptable for Xpert Xpress SARS-CoV-2/FLU/RSV  testing. Fact Sheet for Patients: PinkCheek.be Fact Sheet for Healthcare Providers: GravelBags.it This test is not yet approved or cleared by the [place] FDA and  has been authorized for detection and/or diagnosis of SARS-CoV-2 by  FDA under an Emergency Use Authorization (EUA). This EUA will remain  in effect (meaning this test can be used) for the duration of the  Covid-19 declaration under Section 564(b)(1) of the Act, 21  U.S.C. section 360bbb-3(b)(1), unless the authorization is  terminated or revoked. Performed at [facility], [place] [address], [place], [place] [address]   Blood Culture (routine x 2)     Status: None (Preliminary result)   Collection Time: 03/12/19  8:35 AM   Specimen: BLOOD  Result Value Ref Range Status   Specimen Description BLOOD LEFT ANTECUBITAL  Final   Special Requests   Final    BOTTLES DRAWN AEROBIC AND ANAEROBIC Blood Culture results may not be optimal due to an inadequate volume of blood received in culture bottles   Culture   Final    NO GROWTH 3 DAYS Performed at [facility], [place] [address] [place], [place] [address]    Report Status PENDING  Incomplete  Urine culture     Status: None   Collection Time: 03/15/19  3:09 PM   Specimen: Urine, Clean Catch  Result Value  Ref Range Status   Specimen Description URINE, CLEAN CATCH  Final   Special Requests NONE  Final   Culture   Final    NO GROWTH Performed at [facility], [place] [address] [place], [place] [address]    Report Status 03/16/2019 FINAL  Final     Time coordinating discharge: Over 33  minutes  SIGNED:   Blain Pais, MD  [facility] 03/16/2019, 11:54 AM   If 7PM-7AM, please contact night-coverage [email or web address] Password [email or web address]

## 2019-03-16 NOTE — Progress Notes (Signed)
Physical Therapy Treatment Patient Details Name: Kathryn Bruce MRN: [id number] DOB: [birth year] Today's Date: 03/16/2019    History of Present Illness Pt adm with acute hypoxic respiratory failure due to covid. PMH - CAD, DM, HTN, coronary stent    PT Comments    Pt admitted with above diagnosis. Pt was able to ambulate without device several laps in room.  Pt did desat and see sats below.  Pt currently with functional limitations due to the deficits listed below (see PT Problem List). Pt will benefit from skilled PT to increase their independence and safety with mobility to allow discharge to the venue listed below.    SATURATION QUALIFICATIONS: (This note is used to comply with regulatory documentation for home oxygen)  Patient Saturations on Room Air at Rest = 90%  Patient Saturations on Room Air while Ambulating = 84%  Patient Saturations on 3 Liters of oxygen while Ambulating = 90%  Please briefly explain why patient needs home oxygen:Pt needed 3LO2 to maintain sats above 90% with activity.    Follow Up Recommendations  Home health PT     Equipment Recommendations  Other (comment)(home O2)    Recommendations for Other Services       Precautions / Restrictions Precautions Precautions: None Restrictions Weight Bearing Restrictions: No    Mobility  Bed Mobility Overal bed mobility: Modified Independent;Needs Assistance Bed Mobility: Sit to Supine       Sit to supine: Modified independent (Device/Increase time)      Transfers Overall transfer level: Needs assistance Equipment used: None Transfers: Sit to/from Stand Sit to Stand: Supervision         General transfer comment: supervision for safety and lines  Ambulation/Gait Ambulation/Gait assistance: Min guard Gait Distance (Feet): 105 Feet(60 feet, rest break, 45 feet) Assistive device: None Gait Pattern/deviations: Step-through pattern;Decreased stride length Gait velocity: decr Gait velocity  interpretation: <1.31 ft/sec, indicative of household ambulator General Gait Details: Assist for safety/lines, cues for pursed lip breathing as she does desat with aactivity.  Needed 3LO2 with activity.    Stairs             Wheelchair Mobility    Modified Rankin (Stroke Patients Only)       Balance Overall balance assessment: Mild deficits observed, not formally tested                                          Cognition Arousal/Alertness: Awake/alert Behavior During Therapy: WFL for tasks assessed/performed Overall Cognitive Status: Within Functional Limits for tasks assessed                                        Exercises General Exercises - Upper Extremity Shoulder Flexion: AROM;Both;10 reps;Seated Elbow Flexion: AROM;Both;10 reps;Seated General Exercises - Lower Extremity Ankle Circles/Pumps: AROM;Both;10 reps;Seated Long Arc Quad: AROM;Both;10 reps;Seated Hip Flexion/Marching: AROM;Both;10 reps;Seated    General Comments General comments (skin integrity, edema, etc.): SPO2 as low as 84% on RA with ambulation and improved to >90% with 3LO2.        Pertinent Vitals/Pain Pain Assessment: No/denies pain    Home Living                      Prior Function  PT Goals (current goals can now be found in the care plan section) Acute Rehab PT Goals Patient Stated Goal: return home Progress towards PT goals: Progressing toward goals    Frequency    Min 3X/week      PT Plan Current plan remains appropriate    Co-evaluation              AM-PAC PT "6 Clicks" Mobility   Outcome Measure  Help needed turning from your back to your side while in a flat bed without using bedrails?: None Help needed moving from lying on your back to sitting on the side of a flat bed without using bedrails?: None Help needed moving to and from a bed to a chair (including a wheelchair)?: A Little Help needed standing up  from a chair using your arms (e.g., wheelchair or bedside chair)?: A Little Help needed to walk in hospital room?: A Little Help needed climbing 3-5 steps with a railing? : A Little 6 Click Score: 20    End of Session Equipment Utilized During Treatment: Gait belt;Oxygen Activity Tolerance: Patient limited by fatigue Patient left: with call bell/phone within reach;in chair Nurse Communication: Mobility status PT Visit Diagnosis: Other abnormalities of gait and mobility (R26.89)     Time: [id number] PT Time Calculation (min) (ACUTE ONLY): 28 min  Charges:  $Gait Training: 8-22 mins $Therapeutic Exercise: 8-22 mins                     Kathryn Bruce W,PT Acute Rehabilitation Services Pager:  [phone]  Office:  [place] 03/16/2019, 12:35 PM

## 2019-03-17 LAB — CULTURE, BLOOD (ROUTINE X 2): Culture: NO GROWTH

## 2019-03-18 DIAGNOSIS — M47819 Spondylosis without myelopathy or radiculopathy, site unspecified: Secondary | ICD-10-CM | POA: Diagnosis not present

## 2019-03-18 DIAGNOSIS — I2510 Atherosclerotic heart disease of native coronary artery without angina pectoris: Secondary | ICD-10-CM | POA: Diagnosis not present

## 2019-03-18 DIAGNOSIS — U071 COVID-19: Secondary | ICD-10-CM | POA: Diagnosis not present

## 2019-03-18 DIAGNOSIS — J1282 Pneumonia due to coronavirus disease 2019: Secondary | ICD-10-CM | POA: Diagnosis not present

## 2019-03-18 DIAGNOSIS — I119 Hypertensive heart disease without heart failure: Secondary | ICD-10-CM | POA: Diagnosis not present

## 2019-03-18 DIAGNOSIS — D649 Anemia, unspecified: Secondary | ICD-10-CM | POA: Diagnosis not present

## 2019-03-18 DIAGNOSIS — E669 Obesity, unspecified: Secondary | ICD-10-CM | POA: Diagnosis not present

## 2019-03-18 DIAGNOSIS — I083 Combined rheumatic disorders of mitral, aortic and tricuspid valves: Secondary | ICD-10-CM | POA: Diagnosis not present

## 2019-03-18 DIAGNOSIS — M810 Age-related osteoporosis without current pathological fracture: Secondary | ICD-10-CM | POA: Diagnosis not present

## 2019-03-18 DIAGNOSIS — I248 Other forms of acute ischemic heart disease: Secondary | ICD-10-CM | POA: Diagnosis not present

## 2019-03-18 DIAGNOSIS — E1169 Type 2 diabetes mellitus with other specified complication: Secondary | ICD-10-CM | POA: Diagnosis not present

## 2019-03-18 DIAGNOSIS — G4730 Sleep apnea, unspecified: Secondary | ICD-10-CM | POA: Diagnosis not present

## 2019-03-18 DIAGNOSIS — F17210 Nicotine dependence, cigarettes, uncomplicated: Secondary | ICD-10-CM | POA: Diagnosis not present

## 2019-03-18 DIAGNOSIS — M170 Bilateral primary osteoarthritis of knee: Secondary | ICD-10-CM | POA: Diagnosis not present

## 2019-03-18 DIAGNOSIS — I700 Atherosclerosis of aorta: Secondary | ICD-10-CM | POA: Diagnosis not present

## 2019-03-18 DIAGNOSIS — J9601 Acute respiratory failure with hypoxia: Secondary | ICD-10-CM | POA: Diagnosis not present

## 2019-03-21 DIAGNOSIS — E1142 Type 2 diabetes mellitus with diabetic polyneuropathy: Secondary | ICD-10-CM | POA: Diagnosis not present

## 2019-03-21 DIAGNOSIS — Z09 Encounter for follow-up examination after completed treatment for conditions other than malignant neoplasm: Secondary | ICD-10-CM | POA: Diagnosis not present

## 2019-03-21 DIAGNOSIS — I2510 Atherosclerotic heart disease of native coronary artery without angina pectoris: Secondary | ICD-10-CM | POA: Diagnosis not present

## 2019-03-21 DIAGNOSIS — U071 COVID-19: Secondary | ICD-10-CM | POA: Diagnosis not present

## 2019-03-29 ENCOUNTER — Other Ambulatory Visit: Payer: Self-pay

## 2019-03-29 MED ORDER — CLOPIDOGREL BISULFATE 75 MG PO TABS: 75.0000 mg | ORAL_TABLET | Freq: Every day | ORAL | 3 refills | Status: DC

## 2019-04-01 ENCOUNTER — Telehealth (INDEPENDENT_AMBULATORY_CARE_PROVIDER_SITE_OTHER): Payer: Medicare Other | Admitting: Cardiology

## 2019-04-01 ENCOUNTER — Encounter: Payer: Self-pay | Admitting: Cardiology

## 2019-04-01 DIAGNOSIS — Z09 Encounter for follow-up examination after completed treatment for conditions other than malignant neoplasm: Secondary | ICD-10-CM

## 2019-04-01 DIAGNOSIS — U071 COVID-19: Secondary | ICD-10-CM | POA: Diagnosis not present

## 2019-04-01 DIAGNOSIS — E119 Type 2 diabetes mellitus without complications: Secondary | ICD-10-CM

## 2019-04-01 DIAGNOSIS — E782 Mixed hyperlipidemia: Secondary | ICD-10-CM

## 2019-04-01 DIAGNOSIS — E669 Obesity, unspecified: Secondary | ICD-10-CM

## 2019-04-01 DIAGNOSIS — I2510 Atherosclerotic heart disease of native coronary artery without angina pectoris: Secondary | ICD-10-CM

## 2019-04-01 DIAGNOSIS — I10 Essential (primary) hypertension: Secondary | ICD-10-CM

## 2019-04-01 DIAGNOSIS — E1169 Type 2 diabetes mellitus with other specified complication: Secondary | ICD-10-CM

## 2019-04-01 NOTE — Progress Notes (Signed)
Virtual Visit via Telephone Note   This visit type was conducted due to national recommendations for restrictions regarding the COVID-19 Pandemic (e.g. social distancing) in an effort to limit this patient's exposure and mitigate transmission in our community.  Due to her co-morbid illnesses, this patient is at least at moderate risk for complications without adequate follow up.  This format is felt to be most appropriate for this patient at this time.  The patient did not have access to video technology/had technical difficulties with video requiring transitioning to audio format only (telephone).  All issues noted in this document were discussed and addressed.  No physical exam could be performed with this format.  Please refer to the patient's chart for her  consent to telehealth for [facility].   Date:  04/01/2019   ID:  Kathryn, Bruce [birth year], MRN [id number]  Patient Location: Home Provider Location: Office  PCP:  Maurice Small, MD  Cardiologist:  Buford Dresser, MD  Electrophysiologist:  None   Evaluation Performed:  Follow-Up Visit  Chief Complaint:  Post hospital follow up  History of Present Illness:    Kathryn Bruce is a 74 y.o. female with a history of recent COVID infection, CAD with NSTEMI in 09/2017 s/p DES to proximal LAD, hypertension, hyperlipidemia, type 2 diabetes mellitus, obesity, and tobacco use. She is seen for post hospital follow up. Notes, discharge summary, medications all reviewed today.  The patient does have symptoms concerning for COVID-19 infection (fever, chills, cough, or new shortness of breath).   She is still tired, recovering from Covid, discharged from the hospital 03/16/19. Her partner also has Covid--her partner fell and broke his hip and was in the hospital just before, so she worries they might have caught it in the hospital. Her breathing is improved though not back to baseline.  Reviewed her hospitalization with her. Notable  for demand ischemia, with hsTn peak >2000. Echo without new wall motion abnormalities. She denies any symptoms to me. Her anginal equivalent is arm pain--this is mentioned in the noted but she does not recall. She has not had any symptoms since being home. Taking her medications as noted.  We discussed options for nuclear stress test, see below. She would like to continue to recover first.  Denies chest pain. No PND, orthopnea, LE edema or unexpected weight gain. No syncope or palpitations.   Past Medical History:  Diagnosis Date  . Aortic atherosclerosis ([place]) 10/05/2017  . Arthritis    "spine, knees" (10/06/2017)  . CAD (coronary artery disease), native coronary artery 10/05/2017   10/06/2016 cardiac catheterization showing 99% LAD stenosis, 20 to 30% RCA and circumflex stenosis, 3.5 x 15 mm Sierra stent in proximal LAD by Dr. Angelena Form  . Cancer ([place])    SKIN CANCDER REMOVED FROM NOSE   . Chronic lower back pain   . Diabetes mellitus type 2, noninsulin dependent ([place]) 10/05/2017  . GERD (gastroesophageal reflux disease)   . Hidradenitis   . Hypercholesteremia   . Hyperlipidemia 10/05/2017  . Hypertension   . Obesity (BMI 30-39.9) 10/05/2017  . Osteoarthritis (arthritis due to wear and tear of joints)    right knee  . Sleep apnea    STOP BANG SCORE 5  . Tobacco use 09/05/2011  . Type II diabetes mellitus ([place])    Past Surgical History:  Procedure Laterality Date  . CORONARY ANGIOPLASTY WITH STENT PLACEMENT  10/06/2017  . CORONARY STENT INTERVENTION N/A 10/06/2017   Procedure: CORONARY STENT INTERVENTION;  Surgeon:  Burnell Blanks, MD;  Location: [facility];  Service: Cardiovascular;  Laterality: N/A;  . HERNIA REPAIR    . LEFT HEART CATH AND CORONARY ANGIOGRAPHY N/A 10/06/2017   Procedure: LEFT HEART CATH AND CORONARY ANGIOGRAPHY;  Surgeon: Burnell Blanks, MD;  Location: [facility];  Service: Cardiovascular;  Laterality: N/A;  . MULTIPLE TOOTH  EXTRACTIONS  2017  . TONSILLECTOMY  1963  . VENTRAL HERNIA REPAIR  09/04/2011   Procedure: LAPAROSCOPIC VENTRAL HERNIA;  Surgeon: Gayland Curry, MD,FACS;  Location: [facility];  Service: General;  Laterality: N/A;  LAPAROSCOPIC incarcerated VENTRAL HERNIA  repair      Current Meds  Medication Sig  . acetaminophen (TYLENOL) 500 MG tablet Take 1,000 mg by mouth every 8 (eight) hours as needed (pain).   [place] Kitchen aspirin EC 81 MG EC tablet Take 1 tablet (81 mg total) by mouth daily.  Mar[place] Kitchen atorvastatin (LIPITOR) 40 MG tablet Take 40 mg by mouth daily.  . carvedilol (COREG) 12.5 MG tablet Take 1 tablet (12.5 mg total) by mouth 2 (two) times daily with a meal.  . clopidogrel (PLAVIX) 75 MG tablet Take 1 tablet (75 mg total) by mouth daily.  . CYMBALTA 30 MG capsule Take 60 mg by mouth daily.   Mar[place] Kitchen ezetimibe (ZETIA) 10 MG tablet Take 1 tablet (10 mg total) by mouth daily.  Mar[place] Kitchen gabapentin (NEURONTIN) 100 MG capsule Take 100 mg by mouth 3 (three) times daily.   Mar[place] Kitchen glipiZIDE (GLUCOTROL XL) 5 MG 24 hr tablet Take 5 mg by mouth daily.  [place] Kitchen lisinopril-hydrochlorothiazide (PRINZIDE,ZESTORETIC) 20-25 MG per tablet Take 1 tablet by mouth daily.  . metFORMIN (GLUCOPHAGE) 500 MG tablet Take 1,000 mg by mouth 2 (two) times daily with a meal.  . nitroGLYCERIN (NITROSTAT) 0.4 MG SL tablet Place 1 tablet (0.4 mg total) under the tongue every 5 (five) minutes x 3 doses as needed for chest pain.  Mar[place] Kitchen omeprazole (PRILOSEC) 40 MG capsule Take 40 mg by mouth daily.  . Vitamin D, Ergocalciferol, (DRISDOL) 50000 units CAPS capsule Take 50,000 Units by mouth every 7 (seven) days. Mondays     Allergies:   Patient has no known allergies.   Social History   Tobacco Use  . Smoking status: Current Every Day Smoker    Packs/day: 0.50    Years: 42.00    Pack years: 21.00    Types: Cigarettes  . Smokeless tobacco: Never Used  Substance Use Topics  . Alcohol use: No  . Drug use: No     Family Hx: The patient's family history includes  Atrial fibrillation in her sister and sister; CAD (age of onset: 58) in her father; Cancer in her maternal grandfather; Cancer (age of onset: 84) in her sister; Dementia (age of onset: 28) in her mother; Lung cancer (age of onset: 75) in her brother; Premature CHD in her son.  ROS:   Please see the history of present illness.    All other systems reviewed and are negative.   Prior CV studies:   The following studies were reviewed today: Echo 03/13/19  1. Left ventricular ejection fraction, by visual estimation, is 65 to 70%. The left ventricle has hyperdynamic function. There is no left ventricular hypertrophy.  2. Left ventricular diastolic parameters are consistent with Grade I diastolic dysfunction (impaired relaxation).  3. The left ventricle has no regional wall motion abnormalities.  4. Global right ventricle has normal systolic function.The right ventricular size is normal. No increase in right ventricular  wall thickness.  5. Left atrial size was normal.  6. Right atrial size was normal.  7. The mitral valve is normal in structure. Trivial mitral valve regurgitation. No evidence of mitral stenosis.  8. The tricuspid valve is normal in structure.  9. The aortic valve is normal in structure. Aortic valve regurgitation is not visualized. No evidence of aortic valve sclerosis or stenosis. 10. The pulmonic valve was normal in structure. Pulmonic valve regurgitation is not visualized. 11. Normal pulmonary artery systolic pressure. 12. The inferior vena cava is normal in size with greater than 50% respiratory variability, suggesting right atrial pressure of 3 mmHg.  Labs/Other Tests and Data Reviewed:    EKG:  An ECG dated 03/13/19 was personally reviewed today and demonstrated:  sinus rhythm with PAC  Recent Labs: 03/12/2019: B Natriuretic Peptide 53.9 03/15/2019: ALT 31 03/16/2019: BUN 14; Creatinine, Ser 0.58; Hemoglobin 10.6; Magnesium 1.7; Platelets 510; Potassium 3.9; Sodium 137   Recent  Lipid Panel Lab Results  Component Value Date/Time   CHOL 166 09/03/2018 12:03 PM   TRIG 117 03/11/2019 10:04 PM   HDL 51 09/03/2018 12:03 PM   CHOLHDL 3.3 09/03/2018 12:03 PM   CHOLHDL 4.2 10/05/2017 04:34 PM   LDLCALC 64 09/03/2018 12:03 PM    Wt Readings from Last 3 Encounters:  03/13/19 204 lb 2.3 oz (92.6 kg)  11/25/18 211 lb 9.6 oz (96 kg)  09/03/18 215 lb (97.5 kg)     Objective:    Vital Signs:  There were no vitals taken for this visit.   Speaking comfortably on the phone, no audible wheezing In no acute distress Alert and oriented Normal affect Normal speech  ASSESSMENT & PLAN:    Recent hospitalization for Covid, post-discharge follow up -demand ischemia based on rise and fall of hsTn -asymptomatic -echo with normal to hyperdynamic function and no new wall motion abnormalities -not yet back to her baseline -discussed options for stress testing, now vs. Potentially later. She would like to continue to heal prior to stress test -counseled on red flag warning signs, will call us with any other concerns  History of CAD s/p PCI, prior NSTEMI, hypertension, hyperlipidemia, type II diabetes, obesity, tobacco use -no change to current medications. Continue aspirin, clopidogrel, atorvastatin, ezetimibe -unable to check her vitals today.   Time:   Today, I have spent 15 minutes via phone with the patient with telehealth technology discussing the above problems.  Additional 15 minutes in personal review of echo, hospitalization records and documentation, total time 30 minutes  Patient Instructions  Medication Instructions:  Your Physician recommend you continue on your current medication as directed.    *If you need a refill on your cardiac medications before your next appointment, please call your pharmacy*  Lab Work: None  Testing/Procedures: None  Follow-Up: At [facility], you and your health needs are our priority.  As part of our continuing mission  to provide you with exceptional heart care, we have created designated Provider Care Teams.  These Care Teams include your primary Cardiologist (physician) and Advanced Practice Providers (APPs -  Physician Assistants and Nurse Practitioners) who all work together to provide you with the care you need, when you need it.  Your next appointment:   4 week(s)  The format for your next appointment:   Virtual Visit   Provider:   Buford Dresser, MD      Signed, Buford Dresser, MD  04/01/2019 8:28 AM    [place]

## 2019-04-01 NOTE — Patient Instructions (Signed)
Medication Instructions:  Your Physician recommend you continue on your current medication as directed.    *If you need a refill on your cardiac medications before your next appointment, please call your pharmacy*  Lab Work: None  Testing/Procedures: None  Follow-Up: At [facility], you and your health needs are our priority.  As part of our continuing mission to provide you with exceptional heart care, we have created designated Provider Care Teams.  These Care Teams include your primary Cardiologist (physician) and Advanced Practice Providers (APPs -  Physician Assistants and Nurse Practitioners) who all work together to provide you with the care you need, when you need it.  Your next appointment:   4 week(s)  The format for your next appointment:   Virtual Visit   Provider:   Buford Dresser, MD

## 2019-04-28 ENCOUNTER — Telehealth: Payer: Medicare Other | Admitting: Cardiology

## 2019-08-02 DIAGNOSIS — I10 Essential (primary) hypertension: Secondary | ICD-10-CM | POA: Diagnosis not present

## 2019-08-02 DIAGNOSIS — E785 Hyperlipidemia, unspecified: Secondary | ICD-10-CM | POA: Diagnosis not present

## 2019-08-02 DIAGNOSIS — E1142 Type 2 diabetes mellitus with diabetic polyneuropathy: Secondary | ICD-10-CM | POA: Diagnosis not present

## 2019-08-02 DIAGNOSIS — Z0000 Encounter for general adult medical examination without abnormal findings: Secondary | ICD-10-CM | POA: Diagnosis not present

## 2019-08-04 IMAGING — CR DG CHEST 2V
2 series · 2 of 2 positions shown · non-contrast
Comparison: CT 07/06/2017

CLINICAL DATA: Right arm pain radiating to the central chest.

EXAM:
CHEST - 2 VIEW

[w chest pa]
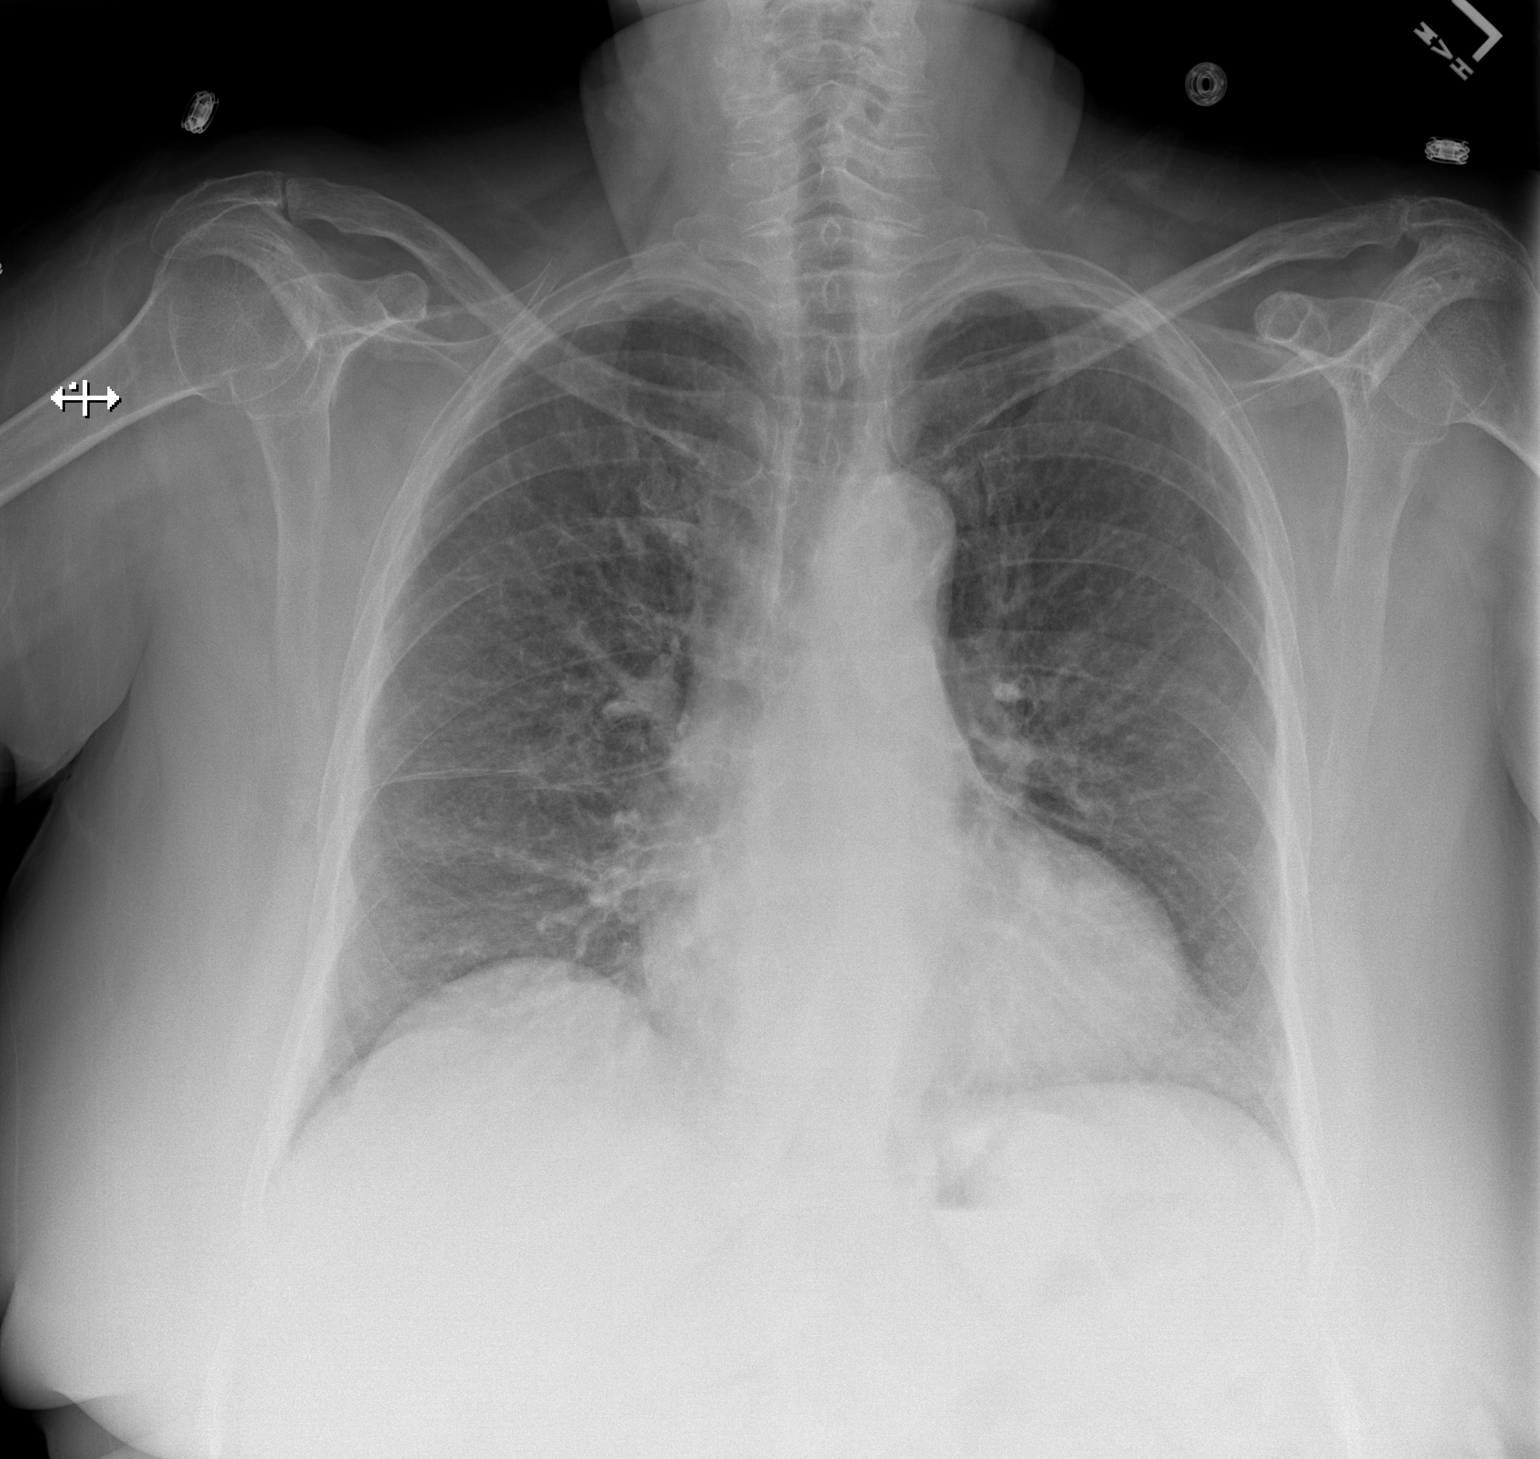

[w chest lat]
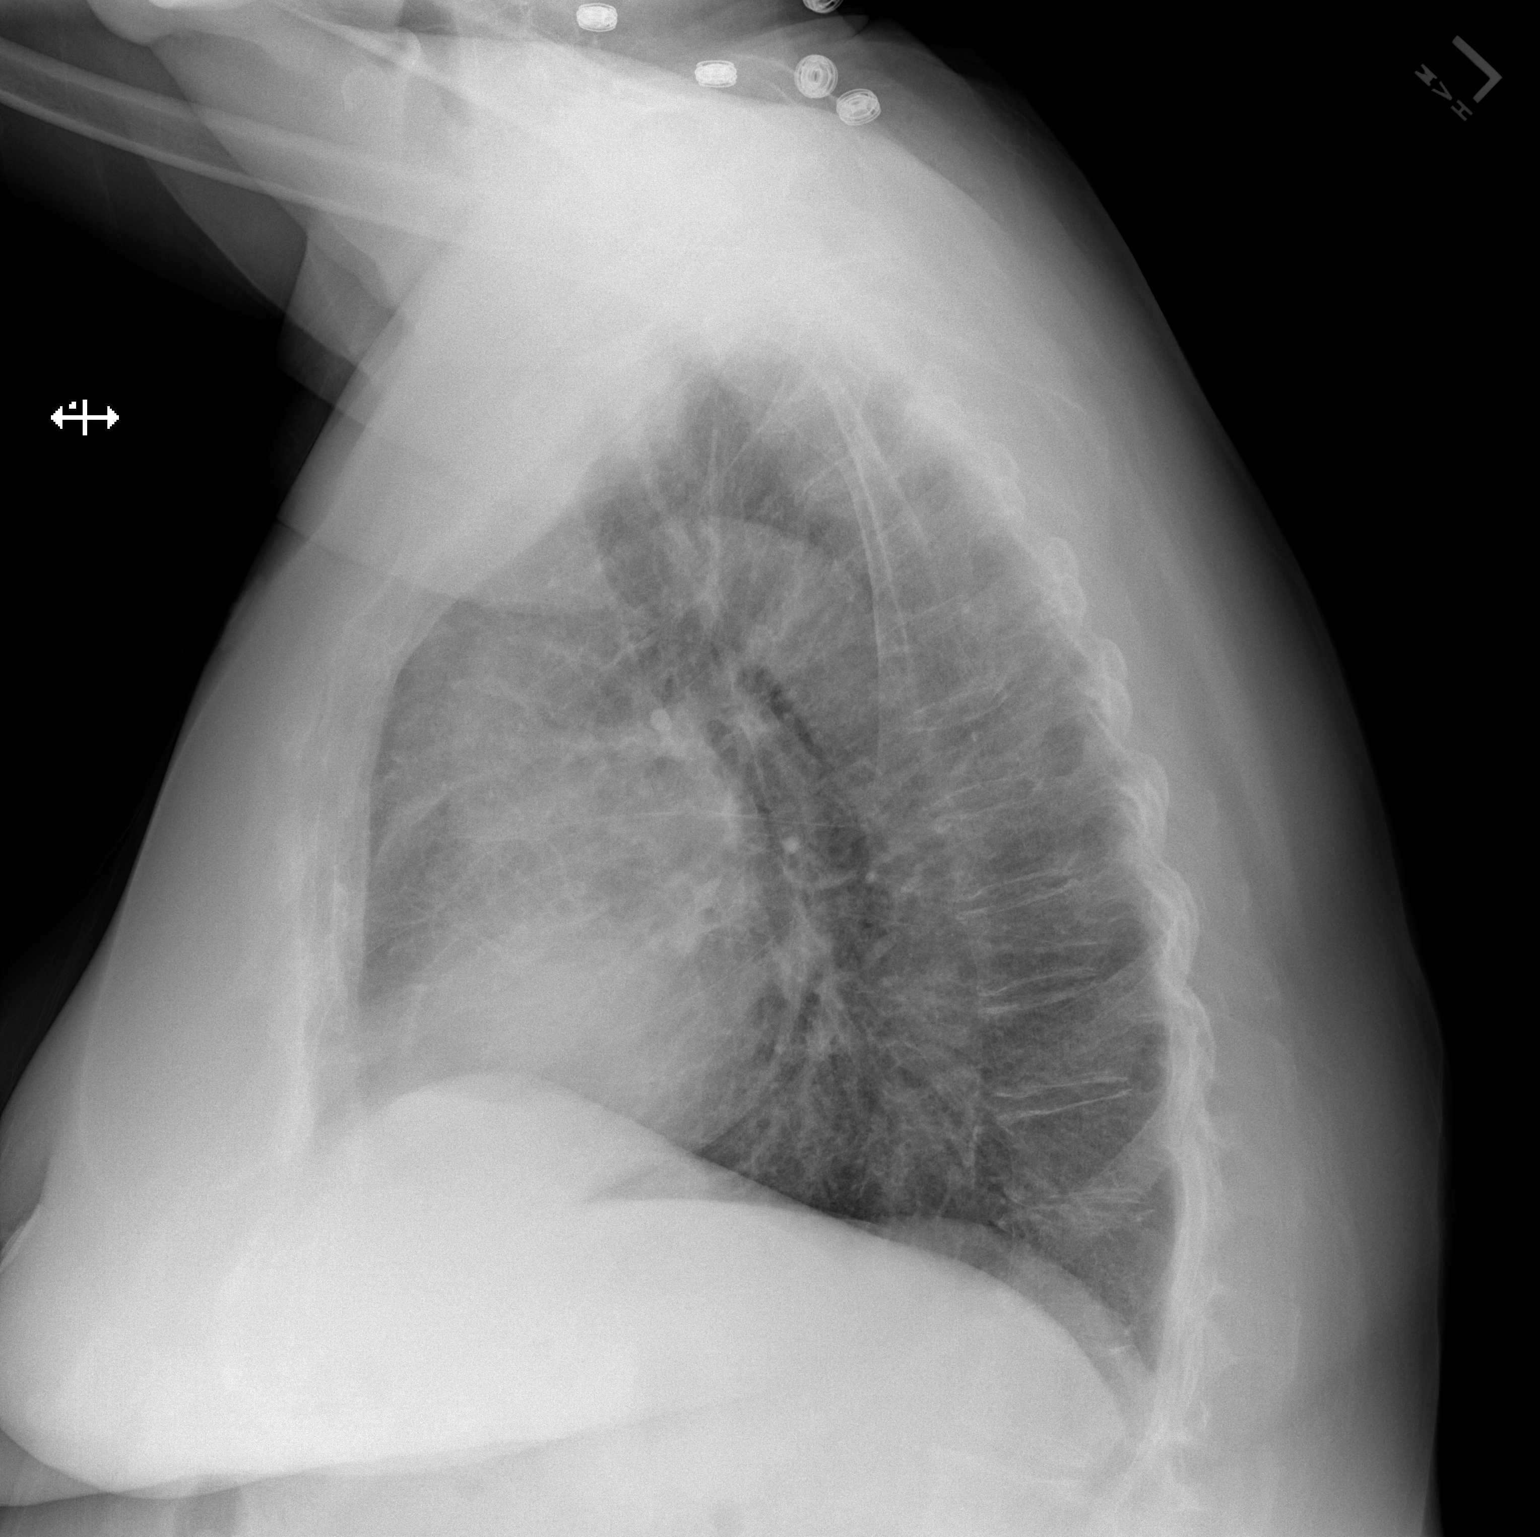

[2 of 2 positions shown; findings below may reference images not displayed]

FINDINGS: Borderline cardiomegaly with moderate aortic atherosclerosis and
central vascular congestion. No pulmonary consolidation, effusion or
pneumothorax. Biapical pleuroparenchymal scarring and/or thickening
is noted. Mild osteoarthritic change of the dorsal spine and both
shoulders.
IMPRESSION: Cardiomegaly with aortic atherosclerosis and mild central vascular
congestion consistent with mild CHF.

## 2019-09-19 DIAGNOSIS — Z1231 Encounter for screening mammogram for malignant neoplasm of breast: Secondary | ICD-10-CM | POA: Diagnosis not present

## 2019-10-27 ENCOUNTER — Other Ambulatory Visit: Payer: Self-pay | Admitting: *Deleted

## 2019-10-27 DIAGNOSIS — Z87891 Personal history of nicotine dependence: Secondary | ICD-10-CM

## 2019-10-27 DIAGNOSIS — F17210 Nicotine dependence, cigarettes, uncomplicated: Secondary | ICD-10-CM

## 2019-10-31 DIAGNOSIS — H2513 Age-related nuclear cataract, bilateral: Secondary | ICD-10-CM | POA: Diagnosis not present

## 2019-10-31 DIAGNOSIS — E119 Type 2 diabetes mellitus without complications: Secondary | ICD-10-CM | POA: Diagnosis not present

## 2019-10-31 DIAGNOSIS — H25013 Cortical age-related cataract, bilateral: Secondary | ICD-10-CM | POA: Diagnosis not present

## 2019-10-31 DIAGNOSIS — H35363 Drusen (degenerative) of macula, bilateral: Secondary | ICD-10-CM | POA: Diagnosis not present

## 2019-11-02 ENCOUNTER — Other Ambulatory Visit: Payer: Self-pay

## 2019-11-02 ENCOUNTER — Ambulatory Visit (INDEPENDENT_AMBULATORY_CARE_PROVIDER_SITE_OTHER)
Admission: RE | Admit: 2019-11-02 | Discharge: 2019-11-02 | Disposition: A | Discharge status: Home / Self Care | Payer: Medicare Other | Source: Ambulatory Visit | Attending: Acute Care | Admitting: Acute Care

## 2019-11-02 DIAGNOSIS — F17210 Nicotine dependence, cigarettes, uncomplicated: Secondary | ICD-10-CM

## 2019-11-02 DIAGNOSIS — Z87891 Personal history of nicotine dependence: Secondary | ICD-10-CM | POA: Diagnosis not present

## 2019-11-07 ENCOUNTER — Telehealth: Payer: Self-pay | Admitting: Cardiology

## 2019-11-07 NOTE — Telephone Encounter (Signed)
Unable to leave a message. Do not see a reason for the call unless it was to confirm her appointment on 9/21

## 2019-11-07 NOTE — Telephone Encounter (Signed)
Patient states she is returning call. However, no current notes or results are available.

## 2019-11-10 NOTE — Progress Notes (Signed)
Please call patient and let them  know their  low dose Ct was read as a Lung RADS 2: nodules that are benign in appearance and behavior with a very low likelihood of becoming a clinically active cancer due to size or lack of growth. Recommendation per radiology is for a repeat LDCT in 12 months. .Please let them  know we will order and schedule their  annual screening scan for 10/2019. Please let them  know there was notation of CAD on their  scan.  Please remind the patient  that this is a non-gated exam therefore degree or severity of disease  cannot be determined. Please have them  follow up with their PCP regarding potential risk factor modification, dietary therapy or pharmacologic therapy if clinically indicated. Pt.  is  currently on statin therapy. Please place order for annual  screening scan for  10/2020 and fax results to PCP. Thanks so much.

## 2019-11-18 ENCOUNTER — Other Ambulatory Visit: Payer: Self-pay | Admitting: *Deleted

## 2019-11-18 DIAGNOSIS — F17210 Nicotine dependence, cigarettes, uncomplicated: Secondary | ICD-10-CM

## 2019-11-18 DIAGNOSIS — Z87891 Personal history of nicotine dependence: Secondary | ICD-10-CM

## 2019-11-21 ENCOUNTER — Other Ambulatory Visit: Payer: Self-pay

## 2019-11-21 MED ORDER — EZETIMIBE 10 MG PO TABS: 10.0000 mg | ORAL_TABLET | Freq: Every day | ORAL | 1 refills | Status: DC

## 2019-11-29 ENCOUNTER — Encounter: Payer: Self-pay | Admitting: Cardiology

## 2019-11-29 ENCOUNTER — Ambulatory Visit (INDEPENDENT_AMBULATORY_CARE_PROVIDER_SITE_OTHER): Payer: Medicare Other | Admitting: Cardiology

## 2019-11-29 ENCOUNTER — Other Ambulatory Visit: Payer: Self-pay

## 2019-11-29 VITALS — BP 130/75 | HR 62 | Temp 97.3°F | Ht 65.0 in | Wt 204.0 lb

## 2019-11-29 DIAGNOSIS — Z720 Tobacco use: Secondary | ICD-10-CM

## 2019-11-29 DIAGNOSIS — E782 Mixed hyperlipidemia: Secondary | ICD-10-CM

## 2019-11-29 DIAGNOSIS — I10 Essential (primary) hypertension: Secondary | ICD-10-CM | POA: Diagnosis not present

## 2019-11-29 DIAGNOSIS — I2510 Atherosclerotic heart disease of native coronary artery without angina pectoris: Secondary | ICD-10-CM

## 2019-11-29 DIAGNOSIS — E669 Obesity, unspecified: Secondary | ICD-10-CM

## 2019-11-29 DIAGNOSIS — Z716 Tobacco abuse counseling: Secondary | ICD-10-CM

## 2019-11-29 DIAGNOSIS — I700 Atherosclerosis of aorta: Secondary | ICD-10-CM

## 2019-11-29 DIAGNOSIS — E1169 Type 2 diabetes mellitus with other specified complication: Secondary | ICD-10-CM

## 2019-11-29 MED ORDER — NITROGLYCERIN 0.4 MG SL SUBL: 0.4000 mg | SUBLINGUAL_TABLET | SUBLINGUAL | 12 refills | Status: AC | PRN

## 2019-11-29 NOTE — Patient Instructions (Signed)
Medication Instructions:  Stop Plavix 75 mg daily Stop Carvedilol 12.5 mg twice a day  *If you need a refill on your cardiac medications before your next appointment, please call your pharmacy*   Lab Work: None ordered   Testing/Procedures: None ordered    Follow-Up: At [facility], you and your health needs are our priority.  As part of our continuing mission to provide you with exceptional heart care, we have created designated Provider Care Teams.  These Care Teams include your primary Cardiologist (physician) and Advanced Practice Providers (APPs -  Physician Assistants and Nurse Practitioners) who all work together to provide you with the care you need, when you need it.  We recommend signing up for the patient portal called "MyChart".  Sign up information is provided on this After Visit Summary.  MyChart is used to connect with patients for Virtual Visits (Telemedicine).  Patients are able to view lab/test results, encounter notes, upcoming appointments, etc.  Non-urgent messages can be sent to your provider as well.   To learn more about what you can do with MyChart, go to [email or web address].    Your next appointment:   6 month(s)  The format for your next appointment:   In Person  Provider:   Buford Dresser, MD  Your physician recommends that you schedule a follow-up appointment in 2 weeks with Pharm D ( Hypertension clinic).

## 2019-11-29 NOTE — Progress Notes (Signed)
Cardiology Office Note:    Date:  11/29/2019   ID:  Kathryn Bruce, DOB [birth year], MRN [id number]  PCP:  Maurice Small, MD  Cardiologist:  Buford Dresser, MD  Referring MD: Maurice Small, MD   Chief Complaint:  Follow up  History of Present Illness:    Kathryn Bruce is a 74 y.o. female with a history of prior COVID infection, CAD with NSTEMI in 09/2017 s/p DES to proximal LAD, hypertension, hyperlipidemia, type 2 diabetes mellitus, obesity, and tobacco use.   Today: Feels like she is back to her baseline after having Covid. Energy level is chronically low. No concerns about her heart--arm pain is her anginal equivalent, and she has not had any. Under stress with sister in ICU.   Denies chest pain, shortness of breath at rest or with normal exertion. No PND, orthopnea, LE edema or unexpected weight gain. No syncope or palpitations.  Reviewed medication list. She thinks she is only taking the carvedilol once a day or maybe not at all. Wants to simplify medications.   Not taking home BP, needs a new battery in her cuff. Still smoking, about 1 ppd, has thought about quitting but not attempted yet.   Discussed medication changes. She is >12 mos post stent, will stop clopidogrel. Twice a day meds are hard for her. Will stop carvedilol. Consider increasing lisinopril or adding amlodipine if BP rises. She is open to follow up with a PharmD as she doesn't think she will be able to check her BP at home.  We discussed the Covid vaccine. She had J&J. She is frustrated that her family refuses to get the vaccine. We discussed the data, how to have conversations about getting vaccinated.   Past Medical History:  Diagnosis Date  . Aortic atherosclerosis ([place]) 10/05/2017  . Arthritis    "spine, knees" (10/06/2017)  . CAD (coronary artery disease), native coronary artery 10/05/2017   10/06/2016 cardiac catheterization showing 99% LAD stenosis, 20 to 30% RCA and circumflex stenosis, 3.5 x 15 mm  Sierra stent in proximal LAD by Dr. Angelena Form  . Cancer ([place])    SKIN CANCDER REMOVED FROM NOSE   . Chronic lower back pain   . Diabetes mellitus type 2, noninsulin dependent ([place]) 10/05/2017  . GERD (gastroesophageal reflux disease)   . Hidradenitis   . Hypercholesteremia   . Hyperlipidemia 10/05/2017  . Hypertension   . Obesity (BMI 30-39.9) 10/05/2017  . Osteoarthritis (arthritis due to wear and tear of joints)    right knee  . Sleep apnea    STOP BANG SCORE 5  . Tobacco use 09/05/2011  . Type II diabetes mellitus ([place])    Past Surgical History:  Procedure Laterality Date  . CORONARY ANGIOPLASTY WITH STENT PLACEMENT  10/06/2017  . CORONARY STENT INTERVENTION N/A 10/06/2017   Procedure: CORONARY STENT INTERVENTION;  Surgeon: Burnell Blanks, MD;  Location: [place] [facility];  Service: Cardiovascular;  Laterality: N/A;  . HERNIA REPAIR    . LEFT HEART CATH AND CORONARY ANGIOGRAPHY N/A 10/06/2017   Procedure: LEFT HEART CATH AND CORONARY ANGIOGRAPHY;  Surgeon: Burnell Blanks, MD;  Location: [facility];  Service: Cardiovascular;  Laterality: N/A;  . MULTIPLE TOOTH EXTRACTIONS  2017  . TONSILLECTOMY  1963  . VENTRAL HERNIA REPAIR  09/04/2011   Procedure: LAPAROSCOPIC VENTRAL HERNIA;  Surgeon: Gayland Curry, MD,FACS;  Location: [facility];  Service: General;  Laterality: N/A;  LAPAROSCOPIC incarcerated VENTRAL HERNIA  repair  Current Meds  Medication Sig  . acetaminophen (TYLENOL) 500 MG tablet Take 1,000 mg by mouth every 8 (eight) hours as needed (pain).   Marland Kitchen aspirin EC 81 MG EC tablet Take 1 tablet (81 mg total) by mouth daily.  Marland Kitchen atorvastatin (LIPITOR) 40 MG tablet Take 40 mg by mouth daily.  . CYMBALTA 30 MG capsule Take 60 mg by mouth daily.   Marland Kitchen ezetimibe (ZETIA) 10 MG tablet Take 1 tablet (10 mg total) by mouth daily.  Marland Kitchen glipiZIDE (GLUCOTROL XL) 5 MG 24 hr tablet Take 5 mg by mouth daily.  Marland Kitchen lisinopril-hydrochlorothiazide (PRINZIDE,ZESTORETIC) 20-25 MG  per tablet Take 1 tablet by mouth daily.  . metFORMIN (GLUCOPHAGE) 500 MG tablet Take 1,000 mg by mouth 2 (two) times daily with a meal.  . nitroGLYCERIN (NITROSTAT) 0.4 MG SL tablet Place 1 tablet (0.4 mg total) under the tongue every 5 (five) minutes x 3 doses as needed for chest pain.  Marland Kitchen omeprazole (PRILOSEC) 40 MG capsule Take 40 mg by mouth daily.  . [DISCONTINUED] clopidogrel (PLAVIX) 75 MG tablet Take 1 tablet (75 mg total) by mouth daily.  . [DISCONTINUED] gabapentin (NEURONTIN) 100 MG capsule Take 100 mg by mouth 3 (three) times daily.   . [DISCONTINUED] nitroGLYCERIN (NITROSTAT) 0.4 MG SL tablet Place 1 tablet (0.4 mg total) under the tongue every 5 (five) minutes x 3 doses as needed for chest pain.  . [DISCONTINUED] Vitamin D, Ergocalciferol, (DRISDOL) 50000 units CAPS capsule Take 50,000 Units by mouth every 7 (seven) days. Mondays     Allergies:   Patient has no known allergies.   Social History   Tobacco Use  . Smoking status: Current Every Day Smoker    Packs/day: 0.50    Years: 42.00    Pack years: 21.00    Types: Cigarettes  . Smokeless tobacco: Never Used  Vaping Use  . Vaping Use: Former  Substance Use Topics  . Alcohol use: No  . Drug use: No     Family Hx: The patient's family history includes Atrial fibrillation in her sister and sister; CAD (age of onset: 84) in her father; Cancer in her maternal grandfather; Cancer (age of onset: 53) in her sister; Dementia (age of onset: 42) in her mother; Lung cancer (age of onset: 42) in her brother; Premature CHD in her son.  ROS:   Please see the history of present illness.    All other systems reviewed and are negative.   Prior CV studies:   The following studies were reviewed today: Echo 03/13/19  1. Left ventricular ejection fraction, by visual estimation, is 65 to 70%. The left ventricle has hyperdynamic function. There is no left ventricular hypertrophy.  2. Left ventricular diastolic parameters are consistent  with Grade I diastolic dysfunction (impaired relaxation).  3. The left ventricle has no regional wall motion abnormalities.  4. Global right ventricle has normal systolic function.The right ventricular size is normal. No increase in right ventricular wall thickness.  5. Left atrial size was normal.  6. Right atrial size was normal.  7. The mitral valve is normal in structure. Trivial mitral valve regurgitation. No evidence of mitral stenosis.  8. The tricuspid valve is normal in structure.  9. The aortic valve is normal in structure. Aortic valve regurgitation is not visualized. No evidence of aortic valve sclerosis or stenosis. 10. The pulmonic valve was normal in structure. Pulmonic valve regurgitation is not visualized. 11. Normal pulmonary artery systolic pressure. 12. The inferior vena cava is normal  in size with greater than 50% respiratory variability, suggesting right atrial pressure of 3 mmHg.  Labs/Other Tests and Data Reviewed:    EKG:  An ECG dated 03/13/19 was personally reviewed today and demonstrated:  sinus rhythm with PAC  Recent Labs: 03/12/2019: B Natriuretic Peptide 53.9 03/15/2019: ALT 31 03/16/2019: BUN 14; Creatinine, Ser 0.58; Hemoglobin 10.6; Magnesium 1.7; Platelets 510; Potassium 3.9; Sodium 137   Recent Lipid Panel Lab Results  Component Value Date/Time   CHOL 166 09/03/2018 12:03 PM   TRIG 117 03/11/2019 10:04 PM   HDL 51 09/03/2018 12:03 PM   CHOLHDL 3.3 09/03/2018 12:03 PM   CHOLHDL 4.2 10/05/2017 04:34 PM   LDLCALC 64 09/03/2018 12:03 PM    Wt Readings from Last 3 Encounters:  11/29/19 204 lb (92.5 kg)  03/13/19 204 lb 2.3 oz (92.6 kg)  11/25/18 211 lb 9.6 oz (96 kg)     Objective:    Vital Signs:  BP 130/75   Pulse 62   Temp (!) 97.3 F (36.3 C)   Ht 5\' 5"  (1.651 m)   Wt 204 lb (92.5 kg)   SpO2 96%   BMI 33.95 kg/m    GEN: Well nourished, well developed in no acute distress HEENT: Normal, moist mucous membranes NECK: No JVD CARDIAC:  regular rhythm, normal S1 and S2, no rubs or gallops. No murmur. VASCULAR: Radial and DP pulses 2+ bilaterally. No carotid bruits RESPIRATORY:  Clear to auscultation without rales, wheezing or rhonchi  ABDOMEN: Soft, non-tender, non-distended MUSCULOSKELETAL:  Ambulates independently SKIN: Warm and dry, no edema NEUROLOGIC:  Alert and oriented x 3. No focal neuro deficits noted. PSYCHIATRIC:  Normal affect   ASSESSMENT & PLAN:    Coronary artery disease involving native coronary artery of native heart without angina pectoris - Plan: nitroGLYCERIN (NITROSTAT) 0.4 MG SL tablet  Mixed hyperlipidemia  Essential hypertension  Tobacco use  Type 2 diabetes mellitus with obesity (HCC)  Aortic atherosclerosis (HCC)  Obesity (BMI 30-39.9)  Tobacco abuse counseling   Hypertension: at goal today -does not think she is taking carvedilol, or at most taking once/day -will take carvedilol off -she will see if she can get a battery for her cuff. But will have her see PharmD to recheck BP. May need to add amlodipine or increase lisinopril if BP elevated on recheck -continue lisinopril-HCTZ.   CAD with prior PCI, aortic atherosclerosis: -no angina -has not required nitro but will refill today, prescription old -continue aspirin 81 mg. Stopping clopidogrel today as she is >12 mos from stent -continue atorvastatin 40 mg -counseled on red flag warning signs that need immediate medical attention  Tobacco abuse and counseling: The patient was counseled on tobacco cessation today for 4 minutes.  Counseling included reviewing the risks of smoking tobacco products, how it impacts the patient's current medical diagnoses and different strategies for quitting.  Pharmacotherapy to aid in tobacco cessation was not prescribed today.  HLD: goal LDL <70 -Taking both atorvastatin and ezetimibe -LDL last 93 but well controlled prior. Will work on lifestyle, recheck  Cardiac risk counseling and  prevention recommendations: -recommend heart healthy/Mediterranean diet, with whole grains, fruits, vegetable, fish, lean meats, nuts, and olive oil. Limit salt. -recommend moderate walking, 3-5 times/week for 30-50 minutes each session. Aim for at least 150 minutes.week. Goal should be pace of 3 miles/hours, or walking 1.5 miles in 30 minutes -recommend avoidance of tobacco products. Avoid excess alcohol. -Additional risk factor control:             -  Diabetes: A1c is 6.8.Discussed SLGT2i/GLP1RA, limited because of cost concerns.              -Weight: BMI ~34, would benefit from weigh loss  Patient Instructions  Medication Instructions:  Stop Plavix 75 mg daily Stop Carvedilol 12.5 mg twice a day  *If you need a refill on your cardiac medications before your next appointment, please call your pharmacy*   Lab Work: None ordered   Testing/Procedures: None ordered    Follow-Up: At [facility], you and your health needs are our priority.  As part of our continuing mission to provide you with exceptional heart care, we have created designated Provider Care Teams.  These Care Teams include your primary Cardiologist (physician) and Advanced Practice Providers (APPs -  Physician Assistants and Nurse Practitioners) who all work together to provide you with the care you need, when you need it.  We recommend signing up for the patient portal called "MyChart".  Sign up information is provided on this After Visit Summary.  MyChart is used to connect with patients for Virtual Visits (Telemedicine).  Patients are able to view lab/test results, encounter notes, upcoming appointments, etc.  Non-urgent messages can be sent to your provider as well.   To learn more about what you can do with MyChart, go to [email or web address].    Your next appointment:   6 month(s)  The format for your next appointment:   In Person  Provider:   Buford Dresser, MD  Your physician recommends that you  schedule a follow-up appointment in 2 weeks with Pharm D ( Hypertension clinic).    Signed, Buford Dresser, MD  11/29/2019 1:32 PM    [facility]

## 2019-12-13 ENCOUNTER — Other Ambulatory Visit: Payer: Self-pay

## 2019-12-13 ENCOUNTER — Ambulatory Visit (INDEPENDENT_AMBULATORY_CARE_PROVIDER_SITE_OTHER): Payer: Medicare Other | Admitting: Pharmacist

## 2019-12-13 VITALS — BP 124/68 | HR 83

## 2019-12-13 DIAGNOSIS — I10 Essential (primary) hypertension: Secondary | ICD-10-CM

## 2019-12-13 NOTE — Progress Notes (Signed)
Patient ID: Kathryn Bruce                 DOB: 1945/10/27                      MRN: [id number]     HPI: Kathryn Bruce is a 74 y.o. female referred by Dr. Harrell Gave to HTN clinic. PMH is significant for HTN, Aortic atherosclerosis, CAD with NSTEMI 09/2017 s/p DES to proximal LAD, T2DM, HLD, tobacco use, obesity, OSA (STOP-BANG 5). Discharged from [facility] 03/16/19 d/t COVID-19 Pt was started on Carvedilol 12.5mg  BID after NSTEMI (2019). Per Dr. Harrell Gave pt was not taking regularly, so the medication was discontinued 11/29/18. Pt previously reported that her home BP cuff was not working possibly due to needing new batteries.   Patient presents today to HTN clinic. She denies dizziness, lightheadedness, headache, blurred vision, SOB, palpitations or chest pain. She states she has some swelling around her ankles and feet. No swelling seen on exam today. Patient states that she know quitting smoking is important to her health, but does not think she will be successful. Not interested in quitting at this time.   She has not been checking her BP at home. She does not exercise due to back pain. Cooks with salt but does not add salt to food at table. Rarely eats canned foods, processed meats/deli meats.   Current HTN meds: Lisinopril - HCTZ 20-25mg   Previously tried: Carvedilol 12.5mg  BID  BP goal: <130/80  Family History:  Sister-  Atrial fibrillation, CAD (age of onset: 59) in her father, Cancer in her maternal grandfather Cancer (age of onset: 55) in her sister, Dementia (age of onset: 39) in her mother, Lung cancer (age of onset: 77) in her brother, Premature CHD in her son.  Social History:  - tobacco abuse: current  - 0.5-1 packs/day, 42 yr history - denies alcohol or drug use  Diet:  Breakfast: coffee, oatmeal, toast- doesn't always eat breakfast Lunch: vegetables,  Dinner: pork chop, chicken, ham rarely Snack: boiled eggs, cookies Drink: coffee (1 cup), buttermilk, tea, coke,  caffeine free diet coke (1/day)  Exercise: no exercise. States her back hurts when she walks  Home BP readings: none  Wt Readings from Last 3 Encounters:  11/29/19 204 lb (92.5 kg)  03/13/19 204 lb 2.3 oz (92.6 kg)  11/25/18 211 lb 9.6 oz (96 kg)   BP Readings from Last 3 Encounters:  11/29/19 130/75  03/16/19 134/61  11/25/18 131/72   Pulse Readings from Last 3 Encounters:  11/29/19 62  03/16/19 63  11/25/18 65    Renal function: - Last obtained 03/15/19  - K 3.9, Scr 0.58  Past Medical History:  Diagnosis Date   Aortic atherosclerosis ([place]) 10/05/2017   Arthritis    "spine, knees" (10/06/2017)   CAD (coronary artery disease), native coronary artery 10/05/2017   10/06/2016 cardiac catheterization showing 99% LAD stenosis, 20 to 30% RCA and circumflex stenosis, 3.5 x 15 mm Sierra stent in proximal LAD by Dr. Angelena Form   Cancer [facility])    SKIN CANCDER REMOVED FROM NOSE    Chronic lower back pain    Diabetes mellitus type 2, noninsulin dependent ([place]) 10/05/2017   GERD (gastroesophageal reflux disease)    Hidradenitis    Hypercholesteremia    Hyperlipidemia 10/05/2017   Hypertension    Obesity (BMI 30-39.9) 10/05/2017   Osteoarthritis (arthritis due to wear and tear of joints)    right knee   Sleep apnea  STOP BANG SCORE 5   Tobacco use 09/05/2011   Type II diabetes mellitus ([place])     Current Outpatient Medications on File Prior to Visit  Medication Sig Dispense Refill   acetaminophen (TYLENOL) 500 MG tablet Take 1,000 mg by mouth every 8 (eight) hours as needed (pain).      aspirin EC 81 MG EC tablet Take 1 tablet (81 mg total) by mouth daily.     atorvastatin (LIPITOR) 40 MG tablet Take 40 mg by mouth daily.  1   CYMBALTA 30 MG capsule Take 60 mg by mouth daily.      ezetimibe (ZETIA) 10 MG tablet Take 1 tablet (10 mg total) by mouth daily. 90 tablet 1   glipiZIDE (GLUCOTROL XL) 5 MG 24 hr tablet Take 5 mg by mouth daily.  1    lisinopril-hydrochlorothiazide (PRINZIDE,ZESTORETIC) 20-25 MG per tablet Take 1 tablet by mouth daily.     metFORMIN (GLUCOPHAGE) 500 MG tablet Take 1,000 mg by mouth 2 (two) times daily with a meal.     nitroGLYCERIN (NITROSTAT) 0.4 MG SL tablet Place 1 tablet (0.4 mg total) under the tongue every 5 (five) minutes x 3 doses as needed for chest pain. 25 tablet 12   omeprazole (PRILOSEC) 40 MG capsule Take 40 mg by mouth daily.  1   No current facility-administered medications on file prior to visit.    No Known Allergies  There were no vitals taken for this visit.   Assessment/Plan:  1. Hypertension - Blood pressure is at goal of <130/80 in clinic today. I have provided patient with blood pressure cuff from patient care fund. She is agreeable to checking her blood pressure at home. Continue lisinopril/HCTZ 20/25mg  daily. Check blood pressure daily. I will call pt in 1 month to see how her home readings are doing. I have encouraged her to purchase a under desk bike to exercise with since walking hurts her back. Also advised that she avoid foods high in sodium and to pay attention to food labels.  2. Tobacco abuse- patient is not ready to quite at this time. We discussed the importance of quitting and that when she is ready we are here to help.   Thank you  Ramond Dial, Pharm.D, BCPS, CPP [place]  [address], [place], [place] [address]  Phone: [phone]; Fax: [phone]

## 2019-12-13 NOTE — Patient Instructions (Addendum)
It was a pleasure to meet you today!  Your blood pressure today was 124/68. Goal is <130/80  Please start checking your blood pressure once a day and record the readings. I will call you in 1 month to see how its going.  Call me at [phone] with any questions  Before checking your blood pressure make sure: You are seated and quite for 5 min before checking Feet are flat on the floor Siting in chair with your back supported straight up and down Arm resting on table or arm of chair at heart level Bladder is empty You have NOT had caffeine or tobacco within the last 30 min  Check your blood pressure 2-3 times about 1-2 min apart. Usually the first reading will be the highest. Record these readings.  Only check your blood pressure once a day, unless otherwise directed Record your blood pressure readings and bring them to all your appointments. If your meter stores your readings in its memory, then you may bring your blood pressure meter with you to your appointments.  You can find a list of validated (accurate) blood pressure cuffs at PopPath.it  Lifestyle changes can make a world of difference and are even more important than medications: Try to keep your sodium intake to 2300 mg of sodium per day Get 6-8 uninterrupted hours of sleep per night Aim for a goal of 150 min of moderate aerobic exercise (ie brisk walking, bike riding) per week

## 2020-01-18 DIAGNOSIS — E785 Hyperlipidemia, unspecified: Secondary | ICD-10-CM | POA: Diagnosis not present

## 2020-01-18 DIAGNOSIS — I10 Essential (primary) hypertension: Secondary | ICD-10-CM | POA: Diagnosis not present

## 2020-01-18 DIAGNOSIS — E119 Type 2 diabetes mellitus without complications: Secondary | ICD-10-CM | POA: Diagnosis not present

## 2020-04-28 ENCOUNTER — Inpatient Hospital Stay (HOSPITAL_COMMUNITY)
Admission: EM | Admit: 2020-04-28 | Discharge: 2020-04-30 | DRG: 246 | Disposition: A | Discharge status: Home / Self Care | Payer: Medicare Other | Attending: Cardiovascular Disease | Admitting: Cardiovascular Disease

## 2020-04-28 ENCOUNTER — Encounter (HOSPITAL_COMMUNITY): Payer: Self-pay | Admitting: Physician Assistant

## 2020-04-28 ENCOUNTER — Inpatient Hospital Stay (HOSPITAL_COMMUNITY)
Admission: EM | Disposition: A | Discharge status: Home / Self Care | Payer: Self-pay | Source: Home / Self Care | Attending: Cardiovascular Disease

## 2020-04-28 ENCOUNTER — Other Ambulatory Visit: Payer: Self-pay

## 2020-04-28 DIAGNOSIS — Z8616 Personal history of COVID-19: Secondary | ICD-10-CM

## 2020-04-28 DIAGNOSIS — I440 Atrioventricular block, first degree: Secondary | ICD-10-CM | POA: Diagnosis not present

## 2020-04-28 DIAGNOSIS — Z720 Tobacco use: Secondary | ICD-10-CM | POA: Diagnosis present

## 2020-04-28 DIAGNOSIS — I2109 ST elevation (STEMI) myocardial infarction involving other coronary artery of anterior wall: Secondary | ICD-10-CM | POA: Diagnosis not present

## 2020-04-28 DIAGNOSIS — Z7982 Long term (current) use of aspirin: Secondary | ICD-10-CM

## 2020-04-28 DIAGNOSIS — E782 Mixed hyperlipidemia: Secondary | ICD-10-CM | POA: Diagnosis present

## 2020-04-28 DIAGNOSIS — Z79899 Other long term (current) drug therapy: Secondary | ICD-10-CM

## 2020-04-28 DIAGNOSIS — I21A9 Other myocardial infarction type: Secondary | ICD-10-CM | POA: Diagnosis not present

## 2020-04-28 DIAGNOSIS — I213 ST elevation (STEMI) myocardial infarction of unspecified site: Secondary | ICD-10-CM

## 2020-04-28 DIAGNOSIS — F17210 Nicotine dependence, cigarettes, uncomplicated: Secondary | ICD-10-CM | POA: Diagnosis present

## 2020-04-28 DIAGNOSIS — E119 Type 2 diabetes mellitus without complications: Secondary | ICD-10-CM | POA: Diagnosis not present

## 2020-04-28 DIAGNOSIS — Z7984 Long term (current) use of oral hypoglycemic drugs: Secondary | ICD-10-CM | POA: Diagnosis not present

## 2020-04-28 DIAGNOSIS — Y832 Surgical operation with anastomosis, bypass or graft as the cause of abnormal reaction of the patient, or of later complication, without mention of misadventure at the time of the procedure: Secondary | ICD-10-CM | POA: Diagnosis present

## 2020-04-28 DIAGNOSIS — D72828 Other elevated white blood cell count: Secondary | ICD-10-CM | POA: Diagnosis not present

## 2020-04-28 DIAGNOSIS — I2510 Atherosclerotic heart disease of native coronary artery without angina pectoris: Secondary | ICD-10-CM | POA: Diagnosis not present

## 2020-04-28 DIAGNOSIS — Z955 Presence of coronary angioplasty implant and graft: Secondary | ICD-10-CM | POA: Diagnosis not present

## 2020-04-28 DIAGNOSIS — E7800 Pure hypercholesterolemia, unspecified: Secondary | ICD-10-CM | POA: Diagnosis not present

## 2020-04-28 DIAGNOSIS — T82855A Stenosis of coronary artery stent, initial encounter: Secondary | ICD-10-CM | POA: Diagnosis not present

## 2020-04-28 DIAGNOSIS — E669 Obesity, unspecified: Secondary | ICD-10-CM | POA: Diagnosis present

## 2020-04-28 DIAGNOSIS — I97190 Other postprocedural cardiac functional disturbances following cardiac surgery: Secondary | ICD-10-CM | POA: Diagnosis not present

## 2020-04-28 DIAGNOSIS — R0789 Other chest pain: Secondary | ICD-10-CM | POA: Diagnosis not present

## 2020-04-28 DIAGNOSIS — I10 Essential (primary) hypertension: Secondary | ICD-10-CM | POA: Diagnosis present

## 2020-04-28 DIAGNOSIS — I499 Cardiac arrhythmia, unspecified: Secondary | ICD-10-CM | POA: Diagnosis not present

## 2020-04-28 DIAGNOSIS — E1169 Type 2 diabetes mellitus with other specified complication: Secondary | ICD-10-CM | POA: Diagnosis present

## 2020-04-28 DIAGNOSIS — K219 Gastro-esophageal reflux disease without esophagitis: Secondary | ICD-10-CM | POA: Diagnosis present

## 2020-04-28 DIAGNOSIS — Z9861 Coronary angioplasty status: Secondary | ICD-10-CM

## 2020-04-28 DIAGNOSIS — I2102 ST elevation (STEMI) myocardial infarction involving left anterior descending coronary artery: Secondary | ICD-10-CM | POA: Diagnosis present

## 2020-04-28 DIAGNOSIS — I491 Atrial premature depolarization: Secondary | ICD-10-CM | POA: Diagnosis not present

## 2020-04-28 HISTORY — PX: CORONARY/GRAFT ACUTE MI REVASCULARIZATION: CATH118305

## 2020-04-28 HISTORY — PX: LEFT HEART CATH AND CORONARY ANGIOGRAPHY: CATH118249

## 2020-04-28 HISTORY — DX: ST elevation (STEMI) myocardial infarction involving other coronary artery of anterior wall: I21.09

## 2020-04-28 LAB — COMPREHENSIVE METABOLIC PANEL
ALT: 11 U/L (ref 0–44)
AST: 14 U/L — ABNORMAL LOW (ref 15–41)
Albumin: 3.1 g/dL — ABNORMAL LOW (ref 3.5–5.0)
Alkaline Phosphatase: 71 U/L (ref 38–126)
Anion gap: 13 (ref 5–15)
BUN: 15 mg/dL (ref 8–23)
CO2: 25 mmol/L (ref 22–32)
Calcium: 8.7 mg/dL — ABNORMAL LOW (ref 8.9–10.3)
Chloride: 101 mmol/L (ref 98–111)
Creatinine, Ser: 0.91 mg/dL (ref 0.44–1.00)
GFR, Estimated: >60 mL/min (ref >60)
Glucose, Bld: 194 mg/dL — ABNORMAL HIGH (ref 70–99)
Potassium: 3.6 mmol/L (ref 3.5–5.1)
Sodium: 139 mmol/L (ref 135–145)
Total Bilirubin: 0.7 mg/dL (ref 0.3–1.2)
Total Protein: 5.9 g/dL — ABNORMAL LOW (ref 6.5–8.1)

## 2020-04-28 LAB — PROTIME-INR
INR: 0.9 (ref 0.8–1.2)
Prothrombin Time: 12 seconds (ref 11.4–15.2)

## 2020-04-28 LAB — HEMOGLOBIN A1C
Hgb A1c MFr Bld: 6.8 % — ABNORMAL HIGH (ref 4.8–5.6)
Mean Plasma Glucose: 148.46 mg/dL

## 2020-04-28 LAB — CBC WITH DIFFERENTIAL/PLATELET
Abs Immature Granulocytes: 0.13 10*3/uL — ABNORMAL HIGH (ref 0.00–0.07)
Basophils Absolute: 0 10*3/uL (ref 0.0–0.1)
Basophils Relative: 0 %
Eosinophils Absolute: 0.1 10*3/uL (ref 0.0–0.5)
Eosinophils Relative: 1 %
HCT: 41.3 % (ref 36.0–46.0)
Hemoglobin: 13.3 g/dL (ref 12.0–15.0)
Immature Granulocytes: 1 %
Lymphocytes Relative: 10 %
Lymphs Abs: 1.8 10*3/uL (ref 0.7–4.0)
MCH: 27.4 pg (ref 26.0–34.0)
MCHC: 32.2 g/dL (ref 30.0–36.0)
MCV: 85 fL (ref 80.0–100.0)
Monocytes Absolute: 1.3 10*3/uL — ABNORMAL HIGH (ref 0.1–1.0)
Monocytes Relative: 7 %
Neutro Abs: 15.5 10*3/uL — ABNORMAL HIGH (ref 1.7–7.7)
Neutrophils Relative %: 81 %
Platelets: 429 10*3/uL — ABNORMAL HIGH (ref 150–400)
RBC: 4.86 MIL/uL (ref 3.87–5.11)
RDW: 14.7 % (ref 11.5–15.5)
WBC: 18.9 10*3/uL — ABNORMAL HIGH (ref 4.0–10.5)
nRBC: 0 % (ref 0.0–0.2)

## 2020-04-28 LAB — LIPID PANEL
Cholesterol: 203 mg/dL — ABNORMAL HIGH (ref 0–200)
HDL: 52 mg/dL (ref >40)
LDL Cholesterol: 118 mg/dL — ABNORMAL HIGH (ref 0–99)
Total CHOL/HDL Ratio: 3.9 RATIO
Triglycerides: 165 mg/dL — ABNORMAL HIGH (ref <150)
VLDL: 33 mg/dL (ref 0–40)

## 2020-04-28 LAB — TROPONIN I (HIGH SENSITIVITY)
Troponin I (High Sensitivity): 7 ng/L (ref <18)
Troponin I (High Sensitivity): 7424 ng/L (ref <18)

## 2020-04-28 LAB — MRSA PCR SCREENING: MRSA by PCR: NEGATIVE

## 2020-04-28 LAB — CBG MONITORING, ED: Glucose-Capillary: 186 mg/dL — ABNORMAL HIGH (ref 70–99)

## 2020-04-28 LAB — GLUCOSE, CAPILLARY: Glucose-Capillary: 185 mg/dL — ABNORMAL HIGH (ref 70–99)

## 2020-04-28 LAB — APTT: aPTT: 26 seconds (ref 24–36)

## 2020-04-28 LAB — SARS CORONAVIRUS 2 (TAT 6-24 HRS): SARS Coronavirus 2: NEGATIVE

## 2020-04-28 SURGERY — CORONARY/GRAFT ACUTE MI REVASCULARIZATION
Anesthesia: LOCAL

## 2020-04-28 MED ORDER — TICAGRELOR 90 MG PO TABS
ORAL_TABLET | ORAL | Status: DC | PRN
  Administered 2020-04-28: 180 mg via ORAL

## 2020-04-28 MED ORDER — TIROFIBAN (AGGRASTAT) BOLUS VIA INFUSION
INTRAVENOUS | Status: DC | PRN
  Administered 2020-04-28: 3482.5 ug via INTRAVENOUS

## 2020-04-28 MED ORDER — DIAZEPAM 5 MG PO TABS: 5.0000 mg | ORAL_TABLET | Freq: Four times a day (QID) | ORAL | Status: DC | PRN

## 2020-04-28 MED ORDER — SODIUM CHLORIDE 0.9 % IV SOLN: INTRAVENOUS | Status: DC

## 2020-04-28 MED ORDER — IOHEXOL 350 MG/ML SOLN
INTRAVENOUS | Status: DC | PRN
  Administered 2020-04-28: 50 mL

## 2020-04-28 MED ORDER — VERAPAMIL HCL 2.5 MG/ML IV SOLN
INTRAVENOUS | Status: AC
  Filled 2020-04-28: qty 2

## 2020-04-28 MED ORDER — HEPARIN (PORCINE) IN NACL 1000-0.9 UT/500ML-% IV SOLN
INTRAVENOUS | Status: AC
  Filled 2020-04-28: qty 1500

## 2020-04-28 MED ORDER — IOHEXOL 350 MG/ML SOLN
INTRAVENOUS | Status: AC
  Filled 2020-04-28: qty 1

## 2020-04-28 MED ORDER — HEPARIN (PORCINE) IN NACL 1000-0.9 UT/500ML-% IV SOLN
INTRAVENOUS | Status: DC | PRN
  Administered 2020-04-28 (×2): 500 mL

## 2020-04-28 MED ORDER — HEPARIN SODIUM (PORCINE) 1000 UNIT/ML IJ SOLN
INTRAMUSCULAR | Status: AC
  Filled 2020-04-28: qty 1

## 2020-04-28 MED ORDER — LIDOCAINE HCL (PF) 1 % IJ SOLN
INTRAMUSCULAR | Status: AC
  Filled 2020-04-28: qty 30

## 2020-04-28 MED ORDER — FENTANYL CITRATE (PF) 100 MCG/2ML IJ SOLN
INTRAMUSCULAR | Status: DC | PRN
  Administered 2020-04-28: 25 ug via INTRAVENOUS

## 2020-04-28 MED ORDER — LIDOCAINE HCL (PF) 1 % IJ SOLN
INTRAMUSCULAR | Status: DC | PRN
  Administered 2020-04-28: 2 mL

## 2020-04-28 MED ORDER — SODIUM CHLORIDE 0.9% FLUSH
3.0000 mL | INTRAVENOUS | Status: DC | PRN
  Administered 2020-04-30: 3 mL via INTRAVENOUS

## 2020-04-28 MED ORDER — CHLORHEXIDINE GLUCONATE CLOTH 2 % EX PADS
6.0000 | MEDICATED_PAD | Freq: Every day | CUTANEOUS | Status: DC
  Administered 2020-04-28: 6 via TOPICAL

## 2020-04-28 MED ORDER — ATORVASTATIN CALCIUM 80 MG PO TABS
80.0000 mg | ORAL_TABLET | Freq: Every day | ORAL | Status: DC
  Administered 2020-04-28 – 2020-04-30 (×3): 80 mg via ORAL
  Filled 2020-04-28 (×3): qty 1

## 2020-04-28 MED ORDER — TICAGRELOR 90 MG PO TABS
90.0000 mg | ORAL_TABLET | Freq: Two times a day (BID) | ORAL | Status: DC
  Administered 2020-04-28 – 2020-04-30 (×4): 90 mg via ORAL
  Filled 2020-04-28 (×4): qty 1

## 2020-04-28 MED ORDER — TICAGRELOR 90 MG PO TABS
ORAL_TABLET | ORAL | Status: AC
  Filled 2020-04-28: qty 1

## 2020-04-28 MED ORDER — TIROFIBAN HCL IN NACL 5-0.9 MG/100ML-% IV SOLN
0.1500 ug/kg/min | INTRAVENOUS | Status: AC
  Administered 2020-04-28 (×3): 0.15 ug/kg/min via INTRAVENOUS
  Filled 2020-04-28 (×2): qty 100

## 2020-04-28 MED ORDER — FENTANYL CITRATE (PF) 100 MCG/2ML IJ SOLN
INTRAMUSCULAR | Status: AC
  Filled 2020-04-28: qty 2

## 2020-04-28 MED ORDER — VERAPAMIL HCL 2.5 MG/ML IV SOLN
INTRAVENOUS | Status: DC | PRN
  Administered 2020-04-28: 10 mL via INTRA_ARTERIAL

## 2020-04-28 MED ORDER — MIDAZOLAM HCL 2 MG/2ML IJ SOLN
INTRAMUSCULAR | Status: AC
  Filled 2020-04-28: qty 2

## 2020-04-28 MED ORDER — SODIUM CHLORIDE 0.9% FLUSH
3.0000 mL | Freq: Two times a day (BID) | INTRAVENOUS | Status: DC
  Administered 2020-04-28 – 2020-04-30 (×4): 3 mL via INTRAVENOUS

## 2020-04-28 MED ORDER — TIROFIBAN HCL IN NACL 5-0.9 MG/100ML-% IV SOLN
0.1500 ug/kg/min | INTRAVENOUS | Status: DC
  Administered 2020-04-28: 0.15 ug/kg/min via INTRAVENOUS
  Filled 2020-04-28: qty 100

## 2020-04-28 MED ORDER — ASPIRIN 81 MG PO CHEW
81.0000 mg | CHEWABLE_TABLET | Freq: Every day | ORAL | Status: DC
  Administered 2020-04-28 – 2020-04-30 (×3): 81 mg via ORAL
  Filled 2020-04-28 (×3): qty 1

## 2020-04-28 MED ORDER — NITROGLYCERIN 1 MG/10 ML FOR IR/CATH LAB
INTRA_ARTERIAL | Status: DC | PRN
  Administered 2020-04-28: 200 ug via INTRACORONARY

## 2020-04-28 MED ORDER — ENOXAPARIN SODIUM 40 MG/0.4ML ~~LOC~~ SOLN
40.0000 mg | SUBCUTANEOUS | Status: DC
  Administered 2020-04-29 – 2020-04-30 (×2): 40 mg via SUBCUTANEOUS
  Filled 2020-04-28 (×2): qty 0.4

## 2020-04-28 MED ORDER — HYDRALAZINE HCL 20 MG/ML IJ SOLN
10.0000 mg | INTRAMUSCULAR | Status: AC | PRN
Start: 2020-04-28 — End: 2020-04-28

## 2020-04-28 MED ORDER — HEPARIN SODIUM (PORCINE) 1000 UNIT/ML IJ SOLN
INTRAMUSCULAR | Status: DC | PRN
  Administered 2020-04-28: 8000 [IU] via INTRAVENOUS
  Administered 2020-04-28: 2000 [IU] via INTRAVENOUS

## 2020-04-28 MED ORDER — LABETALOL HCL 5 MG/ML IV SOLN: 10.0000 mg | INTRAVENOUS | Status: AC | PRN

## 2020-04-28 MED ORDER — ACETAMINOPHEN 325 MG PO TABS: 650.0000 mg | ORAL_TABLET | ORAL | Status: DC | PRN

## 2020-04-28 MED ORDER — TIROFIBAN HCL IN NACL 5-0.9 MG/100ML-% IV SOLN
INTRAVENOUS | Status: AC
  Filled 2020-04-28: qty 100

## 2020-04-28 MED ORDER — TIROFIBAN HCL IN NACL 5-0.9 MG/100ML-% IV SOLN
INTRAVENOUS | Status: AC | PRN
  Administered 2020-04-28: 0.15 ug/kg/min via INTRAVENOUS

## 2020-04-28 MED ORDER — METOPROLOL TARTRATE 12.5 MG HALF TABLET
12.5000 mg | ORAL_TABLET | Freq: Two times a day (BID) | ORAL | Status: DC
  Administered 2020-04-28 – 2020-04-29 (×4): 12.5 mg via ORAL
  Filled 2020-04-28 (×4): qty 1

## 2020-04-28 MED ORDER — MIDAZOLAM HCL 2 MG/2ML IJ SOLN
INTRAMUSCULAR | Status: DC | PRN
  Administered 2020-04-28: 1 mg via INTRAVENOUS

## 2020-04-28 MED ORDER — ONDANSETRON HCL 4 MG/2ML IJ SOLN: 4.0000 mg | Freq: Four times a day (QID) | INTRAMUSCULAR | Status: DC | PRN

## 2020-04-28 MED ORDER — SODIUM CHLORIDE 0.9 % IV SOLN
250.0000 mL | INTRAVENOUS | Status: DC | PRN
  Administered 2020-04-29: 250 mL via INTRAVENOUS

## 2020-04-28 SURGICAL SUPPLY — 15 items
BALLN ~~LOC~~ EUPHORA RX 3.5X12 (BALLOONS) ×2
BALLOON ~~LOC~~ EUPHORA RX 3.5X12 (BALLOONS) IMPLANT
CATH INFINITI JR4 5F (CATHETERS) ×1 IMPLANT
CATH LAUNCHER 5F EBU3.5 (CATHETERS) ×1 IMPLANT
CATH OPTITORQUE TIG 4.0 5F (CATHETERS) ×1 IMPLANT
CATH VISTA GUIDE 6FR XB3.5 (CATHETERS) ×1 IMPLANT
DEVICE RAD COMP TR BAND LRG (VASCULAR PRODUCTS) ×1 IMPLANT
GLIDESHEATH SLEND SS 6F .021 (SHEATH) ×1 IMPLANT
GUIDEWIRE INQWIRE 1.5J.035X260 (WIRE) IMPLANT
INQWIRE 1.5J .035X260CM (WIRE) ×2
KIT HEART LEFT (KITS) ×2 IMPLANT
PACK CARDIAC CATHETERIZATION (CUSTOM PROCEDURE TRAY) ×2 IMPLANT
TRANSDUCER W/STOPCOCK (MISCELLANEOUS) ×2 IMPLANT
TUBING CIL FLEX 10 FLL-RA (TUBING) ×2 IMPLANT
WIRE COUGAR XT STRL 190CM (WIRE) ×1 IMPLANT

## 2020-04-28 NOTE — Progress Notes (Signed)
Audelia Acton, Primary RN, made aware of lab call for critical troponin result [id number]

## 2020-04-28 NOTE — ED Notes (Signed)
4000 heparin given IV

## 2020-04-28 NOTE — ED Notes (Signed)
1 L NS given.

## 2020-04-28 NOTE — Progress Notes (Signed)
Called by nurse concerning TR band, and ecchymosis R hand forearm.  Pt is on aggrastat. Pt had difficult radial access and also significant mid forrearm radial artery spasm during procedure requiring additional verapamil as well as intra-arterial nitroglycerin.  In addition guiding catheter was reduced to 5 [place] with ultimate able to pass the catheter beyond the spasm and perform the intervention.  Patient is able to move her hand.  No significant paresthesias.  TR band may be minimally above the actual puncture site.  No recurrent chest pain.  If necessary may need to hold Aggrastat for 30 minutes.  Will reduce TR band pressure very gradually and arm elevation. Shelva Majestic, MD  11:55 AM

## 2020-04-28 NOTE — ED Notes (Signed)
Pt becoming diaphoretic. Cards at the bedside.

## 2020-04-28 NOTE — Consult Note (Signed)
Responded to page, pt unavailable, staff will call again if further chaplain services needed.   Rev. Jessy Calixte Chaplain   

## 2020-04-28 NOTE — H&P (Addendum)
Cardiology Admission History and Physical:   Patient ID: OYINKANSOLA TRUAX; MRN: [id number]; DOB: September 02, 1945   Admission date: 04/28/2020  Primary Care Provider: Maurice Small, MD Primary Cardiologist: Buford Dresser, MD  Primary Electrophysiologist: None    Chief Complaint:  STEMI  Patient Profile:   DALIANA LEVERETT is a 75 y.o. female with a history of NSTEMI 09/2017 s/p DES to proximal LAD, T2DM, HLD, tobacco use, obesity, OSA (STOP-BANG 5), Covid 03/2019.   History of Present Illness:   Ms. Lehnen Ms. Kearn had been having problems with diarrhea overnight but was otherwise in her usual state of health until 530 this morning.  At that time she had onset of substernal chest pain.  She had not had this before, her previous angina had been arm pain.  The chest pain was associated with arm pain and shortness of breath as well as some nausea.  911 was called.    She was given aspirin 81 mg x 4 and 1 sublingual nitroglycerin.  The nitroglycerin dropped her pressure into the 09T systolic.  She was started on IV fluids and her pressure improved.  In the emergency room, her initial ECG was abnormal, but has subsequently ECG showed clear anterior ST elevation of 3-4 mm.  She was continuing to have pain.  Systolic blood pressure was approximately 100 after a liter and a half of fluid.  She was given Zofran 4 mg for nausea and heparin 4000 units.  The Cath Lab team was called and she was taken up to the Cath Lab.  On arrival to the Cath Lab, she was still having 9/10 pain.   Past Medical History:  Diagnosis Date  . Acute ST elevation myocardial infarction (STEMI) of anterior wall ([place]) 04/28/2020  . Aortic atherosclerosis ([place]) 10/05/2017  . Arthritis    "spine, knees" (10/06/2017)  . CAD (coronary artery disease), native coronary artery 10/05/2017   10/06/2016 cardiac catheterization showing 99% LAD stenosis, 20 to 30% RCA and circumflex stenosis, 3.5 x 15 mm Sierra stent in proximal LAD by  Dr. Angelena Form  . Cancer ([place])    SKIN CANCDER REMOVED FROM NOSE   . Chronic lower back pain   . Diabetes mellitus type 2, noninsulin dependent ([place]) 10/05/2017  . GERD (gastroesophageal reflux disease)   . Hidradenitis   . Hypercholesteremia   . Hyperlipidemia 10/05/2017  . Hypertension   . Obesity (BMI 30-39.9) 10/05/2017  . Osteoarthritis (arthritis due to wear and tear of joints)    right knee  . Sleep apnea    STOP BANG SCORE 5  . Tobacco use 09/05/2011  . Type II diabetes mellitus ([place])     Past Surgical History:  Procedure Laterality Date  . CORONARY ANGIOPLASTY WITH STENT PLACEMENT  10/06/2017  . CORONARY STENT INTERVENTION N/A 10/06/2017   Procedure: CORONARY STENT INTERVENTION;  Surgeon: Burnell Blanks, MD;  Location: [facility];  Service: Cardiovascular;  Laterality: N/A;  . HERNIA REPAIR    . LEFT HEART CATH AND CORONARY ANGIOGRAPHY N/A 10/06/2017   Procedure: LEFT HEART CATH AND CORONARY ANGIOGRAPHY;  Surgeon: Burnell Blanks, MD;  Location: [place] [facility];  Service: Cardiovascular;  Laterality: N/A;  . MULTIPLE TOOTH EXTRACTIONS  2017  . TONSILLECTOMY  1963  . VENTRAL HERNIA REPAIR  09/04/2011   Procedure: LAPAROSCOPIC VENTRAL HERNIA;  Surgeon: Gayland Curry, MD,FACS;  Location: [facility];  Service: General;  Laterality: N/A;  LAPAROSCOPIC incarcerated VENTRAL HERNIA  repair      Medications  Prior to Admission: Prior to Admission medications   Medication Sig Start Date End Date Taking? Authorizing Provider  acetaminophen (TYLENOL) 500 MG tablet Take 1,000 mg by mouth every 8 (eight) hours as needed (pain).     [provider]  aspirin EC 81 MG EC tablet Take 1 tablet (81 mg total) by mouth daily. 10/07/17   Jacolyn Reedy, MD  atorvastatin (LIPITOR) 40 MG tablet Take 40 mg by mouth daily. 08/23/17   [provider]  CYMBALTA 30 MG capsule Take 60 mg by mouth daily.  10/29/11   [provider]  ezetimibe (ZETIA) 10 MG  tablet Take 1 tablet (10 mg total) by mouth daily. 11/21/19 02/19/20  Buford Dresser, MD  glipiZIDE (GLUCOTROL XL) 5 MG 24 hr tablet Take 5 mg by mouth daily. 08/24/17   [provider]  lisinopril-hydrochlorothiazide (PRINZIDE,ZESTORETIC) 20-25 MG per tablet Take 1 tablet by mouth daily.    [provider]  metFORMIN (GLUCOPHAGE) 500 MG tablet Take 1,000 mg by mouth 2 (two) times daily with a meal.    [provider]  nitroGLYCERIN (NITROSTAT) 0.4 MG SL tablet Place 1 tablet (0.4 mg total) under the tongue every 5 (five) minutes x 3 doses as needed for chest pain. 11/29/19   Buford Dresser, MD  omeprazole (PRILOSEC) 40 MG capsule Take 40 mg by mouth daily. 08/24/17   [provider]     Allergies:   No Known Allergies  Social History:   Social History   Socioeconomic History  . Marital status: Widowed    Spouse name: Not on file  . Number of children: Not on file  . Years of education: Not on file  . Highest education level: Not on file  Occupational History  . Not on file  Tobacco Use  . Smoking status: Current Every Day Smoker    Packs/day: 0.50    Years: 42.00    Pack years: 21.00    Types: Cigarettes  . Smokeless tobacco: Never Used  Vaping Use  . Vaping Use: Former  Substance and Sexual Activity  . Alcohol use: No  . Drug use: No  . Sexual activity: Not on file  Other Topics Concern  . Not on file  Social History Narrative  . Not on file   Social Determinants of Health   Financial Resource Strain: Not on file  Food Insecurity: Not on file  Transportation Needs: Not on file  Physical Activity: Not on file  Stress: Not on file  Social Connections: Not on file  Intimate Partner Violence: Not on file    Family History:  The patient's family history includes Atrial fibrillation in her sister and sister; CAD (age of onset: 71) in her father; Cancer in her maternal grandfather; Cancer (age of onset: 53) in her sister;  Dementia (age of onset: 37) in her mother; Lung cancer (age of onset: 32) in her brother; Premature CHD in her son.   The patient She indicated that her mother is deceased. She indicated that her father is deceased. She indicated that two of her three sisters are alive. She indicated that both of her brothers are deceased. She indicated that the status of her maternal grandfather is unknown. She indicated that the status of her son is unknown.    ROS:  Please see the history of present illness.  All other ROS reviewed and negative.     Physical Exam/Data:   Vitals:   04/28/20 1610 04/28/20 0720 04/28/20 9604 04/28/20 0725  BP: (!) 103/47 (!) 99/51  (!) 115/46  Pulse: 67 70  79  Resp: 13 14  11   Temp:   (!) 97.2 F (36.2 C)   TempSrc:   Oral   SpO2: 96% 96%  96%  Weight:      Height:       No intake or output data in the 24 hours ending 04/28/20 0750 Filed Weights   04/28/20 0702  Weight: (!) 139.3 kg   Body mass index is 51.09 kg/m.  General:  Well nourished, well developed, female in acute distress HEENT: normal Lymph: no adenopathy Neck:  JVD not elevated Endocrine:  No thryomegaly Vascular: No carotid bruits; 4/4 extremity pulses 2+ bilaterally  Cardiac:  normal S1, S2; RRR; no murmur, no rub or gallop  Lungs:  clear to auscultation bilaterally, no wheezing, rhonchi or rales  Abd: soft, nontender, no hepatomegaly  Ext: no edema Musculoskeletal:  No deformities, BUE and BLE strength normal and equal Skin: warm and dry  Neuro:  CNs 2-12 intact, no focal abnormalities noted Psych:  Normal affect    EKG:  The ECG was personally reviewed: Sinus rhythm with acute anterior ST elevation Telemetry: Sinus rhythm  Relevant CV Studies:  CATH:   Prox RCA lesion is 30% stenosed.  Prox RCA to Dist RCA lesion is 10% stenosed.  Ost Cx lesion is 20% stenosed.  Prox Cx to Mid Cx lesion is 20% stenosed.  Ost 3rd Mrg lesion is 30% stenosed.  Prox LAD lesion is 99%  stenosed.  A drug-eluting stent was successfully placed using a STENT SIERRA 3.50 X 15 MM.  Post intervention, there is a 0% residual stenosis.  The left ventricular systolic function is normal.  LV end diastolic pressure is normal.  The left ventricular ejection fraction is 55-65% by visual estimate.  There is no mitral valve regurgitation.   1. NSTEMI secondary to severe stenosis in the proximal LAD 2. Successful PTCA/DES x 1 proximal LAD 3. Mild non-obstructive disease in the RCA and Circumflex. 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  4. Normal LV systolic function.   Recommend uninterrupted dual antiplatelet therapy with Aspirin 81mg  daily and Ticagrelor 90mg  twice daily for a minimum of 12 months (ACS - Class I recommendation).   Laboratory Data:  ChemistryNo results for input(s): NA, K, CL, CO2, GLUCOSE, BUN, CREATININE, CALCIUM, GFRNONAA, GFRAA, ANIONGAP in the last 168 hours.  No results for input(s): PROT, ALBUMIN, AST, ALT, ALKPHOS, BILITOT in the last 168 hours. Hematology Recent Labs  Lab 04/28/20 0701  WBC 18.9*  RBC 4.86  HGB 13.3  HCT 41.3  MCV 85.0  MCH 27.4  MCHC 32.2  RDW 14.7  PLT 429*   Cardiac Enzymes  High Sensitivity Troponin:  No results for input(s): TROPONINIHS in the last 720 hours.   BNPNo results for input(s): BNP, PROBNP in the last 168 hours.  DDimer No results for input(s): DDIMER in the last 168 hours. Lipids:  Lab Results  Component Value Date   CHOL 166 09/03/2018   HDL 51 09/03/2018   LDLCALC 64 09/03/2018   TRIG 117 03/11/2019   CHOLHDL 3.3 09/03/2018   INR:  Lab Results  Component Value Date   INR 0.9 04/28/2020   A1c:  Lab Results  Component Value Date   HGBA1C 6.8 (H) 04/28/2020   Thyroid:  Lab Results  Component Value Date   TSH 2.677 10/05/2017    Radiology/Studies:  No results found.  Assessment and Plan:  1.  Anterior STEMI: -She is being taken urgently to the Cath Lab with  further evaluation and treatment depending on the results -She will be screened for cardiac risk factors and their control and continued on other preadmission medications.  Principal Problem:   Acute ST elevation myocardial infarction (STEMI) of anterior wall [facility])     For questions or updates, please contact [facility] Please consult [email or web address] for contact info under Cardiology/STEMI.    Signed, Rosaria Ferries, PA-C  04/28/2020 7:50 AM   Patient seen and examined. Agree with assessment and plan.  Ms. Hollye Pritt is a 75 year old female patient who previously had been followed by Dr. Wynonia Lawman.  She has a history of type 2 diabetes mellitus, hyperlipidemia, sleep apnea, and tobacco use.  In 2019 and to have subtotal stenosis of her proximal LAD and underwent stenting Dr. Angelena Form with insertion of a 3.5 x 15 mm Xience [place] stent.  She had been maintained on DAPT.  More recently, her Plavix was discontinued.  She had been without recurrent anginal symptomatology.  January 2021 she was hospitalized with Covid pneumonia for a week.  Last evening she did not sleep well.  At approximately 530 this morning she awakened and was having diarrhea.  At this time she also became nauseated and developed chest tightness and pressure.  EMS was called.  Upon their arrival her ECG showed early ST elevation precordially.  Upon arrival to the emergency room she now had 3 to 4 mm of acute ST segment inferior injury current anterolaterally.  She was hypotensive upon arrival and was given normal saline fluid bolus.  She received heparin 4000 units in the emergency room.  She was taken urgently to the catheterization laboratory.  Her chest pain had mildly improved and upon arrival to the Cath Lab telemetry just with some improvement in her previous 3 to 4 mm ST elevation.  Emergent catheterization revealed thrombus in the previously placed LAD stent.  I suspect her initial thrombotic occlusion had reperfused  following heparinization such that the initial angiogram showed this but there was TIMI II flow on the LAD with suggestion of apical embolization.  She underwent successful acute intervention to her LAD with high-pressure balloon dilatation.  In the Cath Lab Aggrastat was administered with plans for continuation of Aggrastat for 18 hours post procedure.  Left ventriculography revealed hyperdynamic LV function without wall motion abnormality.  We will admit to the ICU.  Obtain high-sensitivity troponin.  Initiate beta-blocker.  Aggressive lipid-lowering therapy with target LDL less than 70.  Smoking cessation is imperative.  Anticipate discharge in 2 to 3 days.  Troy Sine, MD, [facility] 04/28/2020 9:56 AM

## 2020-04-28 NOTE — ED Provider Notes (Signed)
[facility] EMERGENCY DEPARTMENT Provider Note   CSN: [id number] Arrival date & time: 04/28/20  1829     History Chief Complaint  Patient presents with  . Code STEMI    Kathryn Bruce is a 75 y.o. female.  HPI Level 5 caveat secondary to acuity of condition  75 year old female woke this morning with diarrhea followed by nausea and vomiting.  Around 5:30 AM she began having substernal chest pain she described this as similar to her prior cardiac pain.  EMS gave her aspirin, 1 sublingual nitroglycerin, and Zofran in route.  Pain has continued to worsen here in the emergency department.  She describes her symptoms as including being sweaty.  Code STEMI was activated prehospital Prehospital EKG ED ECG REPORT   Date: 04/28/2020  Rate: 60  Rhythm: normal sinus rhythm  QRS Axis: left  Intervals: normal  ST/T Wave abnormalities: nonspecific ST changes  Conduction Disutrbances:first-degree A-V block   Narrative Interpretation:   Old EKG Reviewed: none available   I have personally reviewed the EKG tracing and agree with the computerized printout as noted.    Past Medical History:  Diagnosis Date  . Aortic atherosclerosis ([place]) 10/05/2017  . Arthritis    "spine, knees" (10/06/2017)  . CAD (coronary artery disease), native coronary artery 10/05/2017   10/06/2016 cardiac catheterization showing 99% LAD stenosis, 20 to 30% RCA and circumflex stenosis, 3.5 x 15 mm Sierra stent in proximal LAD by Dr. Angelena Form  . Cancer ([place])    SKIN CANCDER REMOVED FROM NOSE   . Chronic lower back pain   . Diabetes mellitus type 2, noninsulin dependent ([place]) 10/05/2017  . GERD (gastroesophageal reflux disease)   . Hidradenitis   . Hypercholesteremia   . Hyperlipidemia 10/05/2017  . Hypertension   . Obesity (BMI 30-39.9) 10/05/2017  . Osteoarthritis (arthritis due to wear and tear of joints)    right knee  . Sleep apnea    STOP BANG SCORE 5  . Tobacco use 09/05/2011  . Type II  diabetes mellitus [facility])     Patient Active Problem List   Diagnosis Date Noted  . Clinical diagnosis of COVID-19 03/12/2019  . Acute respiratory failure with hypoxia ([place]) 03/12/2019  . Aortic atherosclerosis ([place]) 10/05/2017  . Mixed hyperlipidemia 10/05/2017  . Obesity (BMI 30-39.9) 10/05/2017  . Type 2 diabetes mellitus with obesity ([place]) 10/05/2017  . CAD (coronary artery disease), native coronary artery 10/05/2017  . Hypertension 09/05/2011  . Tobacco use 09/05/2011    Past Surgical History:  Procedure Laterality Date  . CORONARY ANGIOPLASTY WITH STENT PLACEMENT  10/06/2017  . CORONARY STENT INTERVENTION N/A 10/06/2017   Procedure: CORONARY STENT INTERVENTION;  Surgeon: Burnell Blanks, MD;  Location: [place] [facility];  Service: Cardiovascular;  Laterality: N/A;  . HERNIA REPAIR    . LEFT HEART CATH AND CORONARY ANGIOGRAPHY N/A 10/06/2017   Procedure: LEFT HEART CATH AND CORONARY ANGIOGRAPHY;  Surgeon: Burnell Blanks, MD;  Location: [place] [facility];  Service: Cardiovascular;  Laterality: N/A;  . MULTIPLE TOOTH EXTRACTIONS  2017  . TONSILLECTOMY  1963  . VENTRAL HERNIA REPAIR  09/04/2011   Procedure: LAPAROSCOPIC VENTRAL HERNIA;  Surgeon: Gayland Curry, MD,FACS;  Location: [facility];  Service: General;  Laterality: N/A;  LAPAROSCOPIC incarcerated VENTRAL HERNIA  repair      OB History   No obstetric history on file.     Family History  Problem Relation Age of Onset  . Dementia Mother 54  .  CAD Father 60  . Cancer Sister 3       lung  . Lung cancer Brother 64  . Cancer Maternal Grandfather        colon  . Premature CHD Son   . Atrial fibrillation Sister   . Atrial fibrillation Sister     Social History   Tobacco Use  . Smoking status: Current Every Day Smoker    Packs/day: 0.50    Years: 42.00    Pack years: 21.00    Types: Cigarettes  . Smokeless tobacco: Never Used  Vaping Use  . Vaping Use: Former  Substance Use Topics  . Alcohol  use: No  . Drug use: No    Home Medications Prior to Admission medications   Medication Sig Start Date End Date Taking? Authorizing Provider  acetaminophen (TYLENOL) 500 MG tablet Take 1,000 mg by mouth every 8 (eight) hours as needed (pain).     [provider]  aspirin EC 81 MG EC tablet Take 1 tablet (81 mg total) by mouth daily. 10/07/17   Jacolyn Reedy, MD  atorvastatin (LIPITOR) 40 MG tablet Take 40 mg by mouth daily. 08/23/17   [provider]  CYMBALTA 30 MG capsule Take 60 mg by mouth daily.  10/29/11   [provider]  ezetimibe (ZETIA) 10 MG tablet Take 1 tablet (10 mg total) by mouth daily. 11/21/19 02/19/20  Buford Dresser, MD  glipiZIDE (GLUCOTROL XL) 5 MG 24 hr tablet Take 5 mg by mouth daily. 08/24/17   [provider]  lisinopril-hydrochlorothiazide (PRINZIDE,ZESTORETIC) 20-25 MG per tablet Take 1 tablet by mouth daily.    [provider]  metFORMIN (GLUCOPHAGE) 500 MG tablet Take 1,000 mg by mouth 2 (two) times daily with a meal.    [provider]  nitroGLYCERIN (NITROSTAT) 0.4 MG SL tablet Place 1 tablet (0.4 mg total) under the tongue every 5 (five) minutes x 3 doses as needed for chest pain. 11/29/19   Buford Dresser, MD  omeprazole (PRILOSEC) 40 MG capsule Take 40 mg by mouth daily. 08/24/17   [provider]    Allergies    Patient has no known allergies.  Review of Systems   Review of Systems  All other systems reviewed and are negative.   Physical Exam Updated Vital Signs BP (!) 70/51   Pulse (!) 56   Resp 17   Ht 1.651 m (5\' 5" )   Wt (!) 139.3 kg   SpO2 96%   BMI 51.09 kg/m   Physical Exam Vitals and nursing note reviewed.  Constitutional:      General: She is in acute distress.     Appearance: Normal appearance. She is ill-appearing.  HENT:     Head: Normocephalic.     Right Ear: External ear normal.     Left Ear: External ear normal.     Nose: Nose normal.      Mouth/Throat:     Mouth: Mucous membranes are moist.  Eyes:     Pupils: Pupils are equal, round, and reactive to light.  Cardiovascular:     Rate and Rhythm: Normal rate and regular rhythm.     Pulses: Normal pulses.  Pulmonary:     Effort: Pulmonary effort is normal.     Breath sounds: Normal breath sounds.  Abdominal:     Palpations: Abdomen is soft.  Musculoskeletal:        General: Normal range of motion.     Cervical back: Normal range of motion.  Skin:    General: Skin is warm.     Comments: Patient is diaphoretic  Neurological:     General: No focal deficit present.     Mental Status: She is alert and oriented to person, place, and time.  Psychiatric:        Mood and Affect: Mood normal.     ED Results / Procedures / Treatments   Labs (all labs ordered are listed, but only abnormal results are displayed) Labs Reviewed  CBG MONITORING, ED - Abnormal; Notable for the following components:      Result Value   Glucose-Capillary 186 (*)    All other components within normal limits  SARS CORONAVIRUS 2 (TAT 6-24 HRS)  HEMOGLOBIN A1C  CBC WITH DIFFERENTIAL/PLATELET  PROTIME-INR  APTT  COMPREHENSIVE METABOLIC PANEL  LIPID PANEL  TROPONIN I (HIGH SENSITIVITY)    EKG None  ED ECG REPORT   Date: 04/28/2020 4010  Rate: 60  Rhythm: normal sinus rhythm  QRS Axis: right  Intervals: normal  ST/T Wave abnormalities: ST elevations anteriorly  Conduction Disutrbances:none  Narrative Interpretation:   Old EKG Reviewed: none available  I have personally reviewed the EKG tracing and agree with the computerized printout as noted.  ED ECG REPORT   Date: 04/28/2020  Rate: 61  Rhythm: normal sinus rhythm  QRS Axis: left  Intervals: normal  ST/T Wave abnormalities: ST elevations anteriorly and ST depressions laterally  Conduction Disutrbances:none  Narrative Interpretation:   Old EKG Reviewed: changes noted  I have personally reviewed the EKG tracing and agree with  the computerized printout as noted.  Radiology No results found.  Procedures .Critical Care Performed by: Pattricia Boss, MD Authorized by: Pattricia Boss, MD   Critical care provider statement:    Critical care time (minutes):  45   Critical care end time:  04/28/2020 7:35 AM   Critical care was time spent personally by me on the following activities:  Discussions with consultants, evaluation of patient's response to treatment, examination of patient, ordering and performing treatments and interventions, ordering and review of laboratory studies, ordering and review of radiographic studies, pulse oximetry, re-evaluation of patient's condition, obtaining history from patient or surrogate and review of old charts   Patient with evolving STEMI Hypotensive initially to 70/40 500 cc given blood pressure improving to 100/60 Patient continues to have 10 out of 10 chest pain.  She is given heparin here in the ED.  Blood sugars are checked and 180.  Cardiology at bedside and awaiting Cath Lab  Medications Ordered in ED Medications  0.9 %  sodium chloride infusion (has no administration in time range)    ED Course  I have reviewed the triage vital signs and the nursing notes.  Pertinent labs & imaging results that were available during my care of the patient were reviewed by me and considered in my medical decision making (see chart for details). Patient had repeat EKGs in the ED as her chest pain involved.  EKG #1 in the ED done at 0659   MDM Rules/Calculators/A&P                           Final Clinical Impression(s) / ED Diagnoses Final diagnoses:  ST elevation myocardial infarction (STEMI), unspecified artery [facility])    Rx / DC Orders ED Discharge Orders    None       Pattricia Boss, MD 04/28/20 [phone]

## 2020-04-28 NOTE — ED Triage Notes (Signed)
Pt bib ems with reports of sudden onset cp approx 0500 today. Pt with EKG changes consistent with stemi, activated in field. Pt with nausea. Given 324mg  asa, 1 SL nitro and 4mg  zofran en route. EDP to bedside on arrival. BP 100/60, HR 80s. 20g LAC.

## 2020-04-28 NOTE — ED Notes (Signed)
500cc NS bolus initiated.  

## 2020-04-29 ENCOUNTER — Inpatient Hospital Stay (HOSPITAL_COMMUNITY): Payer: Medicare Other

## 2020-04-29 DIAGNOSIS — I2109 ST elevation (STEMI) myocardial infarction involving other coronary artery of anterior wall: Secondary | ICD-10-CM

## 2020-04-29 LAB — BASIC METABOLIC PANEL
Anion gap: 8 (ref 5–15)
BUN: 7 mg/dL — ABNORMAL LOW (ref 8–23)
CO2: 26 mmol/L (ref 22–32)
Calcium: 8.2 mg/dL — ABNORMAL LOW (ref 8.9–10.3)
Chloride: 107 mmol/L (ref 98–111)
Creatinine, Ser: 0.59 mg/dL (ref 0.44–1.00)
GFR, Estimated: >60 mL/min (ref >60)
Glucose, Bld: 122 mg/dL — ABNORMAL HIGH (ref 70–99)
Potassium: 3.1 mmol/L — ABNORMAL LOW (ref 3.5–5.1)
Sodium: 141 mmol/L (ref 135–145)

## 2020-04-29 LAB — POCT I-STAT, CHEM 8
BUN: 16 mg/dL (ref 8–23)
Calcium, Ion: 1.04 mmol/L — ABNORMAL LOW (ref 1.15–1.40)
Chloride: 102 mmol/L (ref 98–111)
Creatinine, Ser: 0.5 mg/dL (ref 0.44–1.00)
Glucose, Bld: 179 mg/dL — ABNORMAL HIGH (ref 70–99)
HCT: 39 % (ref 36.0–46.0)
Hemoglobin: 13.3 g/dL (ref 12.0–15.0)
Potassium: 2.9 mmol/L — ABNORMAL LOW (ref 3.5–5.1)
Sodium: 142 mmol/L (ref 135–145)
TCO2: 26 mmol/L (ref 22–32)

## 2020-04-29 LAB — CBC
HCT: 37.6 % (ref 36.0–46.0)
Hemoglobin: 11.9 g/dL — ABNORMAL LOW (ref 12.0–15.0)
MCH: 26.4 pg (ref 26.0–34.0)
MCHC: 31.6 g/dL (ref 30.0–36.0)
MCV: 83.6 fL (ref 80.0–100.0)
Platelets: 374 10*3/uL (ref 150–400)
RBC: 4.5 MIL/uL (ref 3.87–5.11)
RDW: 14.7 % (ref 11.5–15.5)
WBC: 12.2 10*3/uL — ABNORMAL HIGH (ref 4.0–10.5)
nRBC: 0 % (ref 0.0–0.2)

## 2020-04-29 LAB — GLUCOSE, CAPILLARY
Glucose-Capillary: 197 mg/dL — ABNORMAL HIGH (ref 70–99)
Glucose-Capillary: 129 mg/dL — ABNORMAL HIGH (ref 70–99)
Glucose-Capillary: 122 mg/dL — ABNORMAL HIGH (ref 70–99)
Glucose-Capillary: 130 mg/dL — ABNORMAL HIGH (ref 70–99)
Glucose-Capillary: 128 mg/dL — ABNORMAL HIGH (ref 70–99)

## 2020-04-29 LAB — ECHOCARDIOGRAM COMPLETE
AR max vel: 1.58 cm2
AV Area VTI: 1.93 cm2
AV Area mean vel: 1.59 cm2
AV Mean grad: 8 mmHg
AV Peak grad: 16.8 mmHg
Ao pk vel: 2.05 m/s
Area-P 1/2: 3.37 cm2
Height: 65 in
S' Lateral: 2.9 cm
Single Plane A4C EF: 62.4 %
Weight: 3360 oz

## 2020-04-29 MED ORDER — POTASSIUM CHLORIDE CRYS ER 10 MEQ PO TBCR
40.0000 meq | EXTENDED_RELEASE_TABLET | Freq: Once | ORAL | Status: AC
  Administered 2020-04-29: 40 meq via ORAL
  Filled 2020-04-29: qty 4

## 2020-04-29 MED ORDER — INSULIN ASPART 100 UNIT/ML ~~LOC~~ SOLN: 0.0000 [IU] | Freq: Every day | SUBCUTANEOUS | Status: DC

## 2020-04-29 MED ORDER — PERFLUTREN LIPID MICROSPHERE
1.0000 mL | INTRAVENOUS | Status: DC | PRN
  Administered 2020-04-29: 2 mL via INTRAVENOUS
  Filled 2020-04-29: qty 10

## 2020-04-29 MED ORDER — EZETIMIBE 10 MG PO TABS
10.0000 mg | ORAL_TABLET | Freq: Every day | ORAL | Status: DC
  Administered 2020-04-29 – 2020-04-30 (×2): 10 mg via ORAL
  Filled 2020-04-29 (×2): qty 1

## 2020-04-29 MED ORDER — POTASSIUM CHLORIDE CRYS ER 20 MEQ PO TBCR
40.0000 meq | EXTENDED_RELEASE_TABLET | Freq: Two times a day (BID) | ORAL | Status: DC
  Administered 2020-04-29 – 2020-04-30 (×3): 40 meq via ORAL
  Filled 2020-04-29 (×3): qty 2

## 2020-04-29 MED ORDER — PERFLUTREN PROTEIN A MICROSPH IV SUSP
0.5000 mL | Freq: Once | INTRAVENOUS | Status: DC
  Filled 2020-04-29 (×2): qty 3

## 2020-04-29 MED ORDER — LISINOPRIL 5 MG PO TABS
5.0000 mg | ORAL_TABLET | Freq: Every day | ORAL | Status: DC
Start: 2020-04-29 — End: 2020-04-30
  Administered 2020-04-29: 5 mg via ORAL
  Filled 2020-04-29: qty 1

## 2020-04-29 MED ORDER — PERFLUTREN LIPID MICROSPHERE: 1.0000 mL | INTRAVENOUS | Status: AC | PRN

## 2020-04-29 MED ORDER — INSULIN ASPART 100 UNIT/ML ~~LOC~~ SOLN
0.0000 [IU] | Freq: Three times a day (TID) | SUBCUTANEOUS | Status: DC
  Administered 2020-04-29 – 2020-04-30 (×3): 2 [IU] via SUBCUTANEOUS

## 2020-04-29 NOTE — Progress Notes (Signed)
Progress Note  Patient Name: Kathryn Bruce Date of Encounter: 04/29/2020  Primary Cardiologist: Buford Dresser, MD   Subjective   Resting, no CP.   Inpatient Medications    Scheduled Meds: . aspirin  81 mg Oral Daily  . atorvastatin  80 mg Oral Daily  . Chlorhexidine Gluconate Cloth  6 each Topical Daily  . enoxaparin (LOVENOX) injection  40 mg Subcutaneous Q24H  . insulin aspart  0-15 Units Subcutaneous TID WC  . insulin aspart  0-5 Units Subcutaneous QHS  . metoprolol tartrate  12.5 mg Oral BID  . sodium chloride flush  3 mL Intravenous Q12H  . ticagrelor  90 mg Oral BID   Continuous Infusions: . sodium chloride    . sodium chloride Stopped (04/29/20 0425)  . sodium chloride 10 mL/hr at 04/29/20 0700   PRN Meds: sodium chloride, acetaminophen, diazepam, ondansetron (ZOFRAN) IV, sodium chloride flush   Vital Signs    Vitals:   04/29/20 0700 04/29/20 0811 04/29/20 0900 04/29/20 1000  BP: (!) 157/66     Pulse: (!) 55  69 63  Resp: 18  20 17   Temp:  98.7 F (37.1 C)    TempSrc:      SpO2: 90%  98% 97%  Weight:      Height:        Intake/Output Summary (Last 24 hours) at 04/29/2020 1115 Last data filed at 04/29/2020 0700 Gross per 24 hour  Intake 2399.71 ml  Output 1300 ml  Net 1099.71 ml   Filed Weights   04/28/20 0702 04/28/20 1100  Weight: (!) 139.3 kg 95.3 kg    Telemetry    SR with AIVR yesterday  - Personally Reviewed  ECG    SR, anterior ST elevations and lateral T wave inversions, significantly improved from STEMI ECG.- Personally Reviewed  Physical Exam   GEN: No acute distress.   Neck: No JVD Cardiac: regular rhythm, normal rate, no murmurs, rubs, or gallops.  There is a clot at the radial cath site dressed with Tegaderm.  Large ecchymosis over right forearm, stable and by report is improving.  Distal hand and fingers are well-perfused with grossly normal sensation. Respiratory: Clear to auscultation bilaterally. GI: Soft,  nontender, non-distended  MS: No edema; No deformity. Neuro:  Nonfocal  Psych: Normal affect   Labs    Chemistry Recent Labs  Lab 04/28/20 0701 04/28/20 0809 04/29/20 0140  NA 139 142 141  K 3.6 2.9* 3.1*  CL 101 102 107  CO2 25  --  26  GLUCOSE 194* 179* 122*  BUN 15 16 7*  CREATININE 0.91 0.50 0.59  CALCIUM 8.7*  --  8.2*  PROT 5.9*  --   --   ALBUMIN 3.1*  --   --   AST 14*  --   --   ALT 11  --   --   ALKPHOS 71  --   --   BILITOT 0.7  --   --   GFRNONAA >60  --  >60  ANIONGAP 13  --  8     Hematology Recent Labs  Lab 04/28/20 0701 04/28/20 0809 04/29/20 0140  WBC 18.9*  --  12.2*  RBC 4.86  --  4.50  HGB 13.3 13.3 11.9*  HCT 41.3 39.0 37.6  MCV 85.0  --  83.6  MCH 27.4  --  26.4  MCHC 32.2  --  31.6  RDW 14.7  --  14.7  PLT 429*  --  374  Cardiac EnzymesNo results for input(s): TROPONINI in the last 168 hours. No results for input(s): TROPIPOC in the last 168 hours.   BNPNo results for input(s): BNP, PROBNP in the last 168 hours.   DDimer No results for input(s): DDIMER in the last 168 hours.   Radiology    CARDIAC CATHETERIZATION  Addendum Date: 04/28/2020    Dist Cx lesion is 30% stenosed.  1st Mrg lesion is 20% stenosed.  Prox RCA lesion is 10% stenosed.  Dist LAD lesion is 100% stenosed.  Post intervention, there is a 0% residual stenosis.  Prox LAD to Mid LAD lesion is 80% stenosed.  The left ventricular systolic function is normal.  LV end diastolic pressure is normal.  The left ventricular ejection fraction is greater than 65% by visual estimate.  Acute ST segment elevation myocardial infarction secondary to thrombotic subtotal occlusion within the previously placed proximal LAD stent with probable apical distal embolization. Mild concomitant nonobstructive CAD involving the left circumflex 20% mild narrowings in the OM1 vessel and AV groove and mild irregularity in the mid RCA. Successful PCI to the thrombotic lesion in the previously  placed Xience 3.5 x 15 mm stent treated with pliant balloon dilatation up to 3.5 mm.  Residual stenosis 0 with brisk TIMI-3 flow.  However there is still probable abrupt cut off at the very apex of the LAD due to initial thrombus embolization Hyperdynamic LV function with EF estimate greater than 65% without focal wall motion abnormality. LVEDP 12 mm. RECOMMENDATION: DAPT indefinitely in this patient who had previously undergone stenting of her proximal LAD with subsequent Plavix discontinuation.  Continue Aggrastat 18 hours post procedure.  Aggressive lipid-lowering therapy and optimal blood pressure control.  Smoking cessation is essential.   Result Date: 04/28/2020  Dist Cx lesion is 30% stenosed.  1st Mrg lesion is 20% stenosed.  Prox RCA lesion is 10% stenosed.  Dist LAD lesion is 100% stenosed.  Post intervention, there is a 0% residual stenosis.  Prox LAD to Mid LAD lesion is 80% stenosed.  The left ventricular systolic function is normal.  LV end diastolic pressure is normal.  The left ventricular ejection fraction is greater than 65% by visual estimate.  Acute ST segment elevation myocardial infarction secondary to thrombotic subtotal occlusion within the previously placed proximal LAD stent with probable apical distal embolization. Mild concomitant nonobstructive CAD involving the left circumflex 20% mild narrowings in the OM1 vessel and AV groove and mild irregularity in the mid RCA. Successful PCI to the thrombotic lesion in the previously placed Xience 3.5 x 15 mm stent treated with pliant balloon dilatation up to 3.5 mm.  Residual stenosis 0 with brisk TIMI-3 flow.  However there is still probable abrupt cut off at the very apex of the LAD due to initial thrombus embolization Hyperdynamic LV function with EF estimate greater than 65% without focal wall motion abnormality. LVEDP 12 mm. RECOMMENDATION: DAPT indefinitely in this patient who had previously undergone stenting of her proximal LAD  with subsequent Plavix discontinuation.  Aggressive lipid-lowering therapy and optimal blood pressure control.    Cardiac Studies   Echo pending  Patient Profile       Kathryn Bruce is a 75 y.o. female with a history of NSTEMI 09/2017 s/p DES to proximal LAD, T2DM, HLD, tobacco use, obesity, OSA (STOP-BANG 5), Covid 03/2019. s/p Anterior STEMI.   Assessment & Plan   Principal Problem:   Acute ST elevation myocardial infarction (STEMI) of anterior wall (HCC) Active Problems:  STEMI involving left anterior descending coronary artery (HCC)   Anterior stemi with history of prior PCI and thrombotic subtotal occlusion within previously placed stent- Per cath note -  "DAPT indefinitely in this patient who had previously undergone stenting of her proximal LAD with subsequent Plavix discontinuation. Continue Aggrastat 18 hours post procedure. Aggressive lipid-lowering therapy and optimal blood pressure control. Smoking cessation is essential. " - ASA 81 mg daily, Brilinta 90 mg BID indefinitely - Atorva 80 mg daily - metoprolol tartrate 12.5 mg BID  HTN - start lisinopril 5 mg daily  HLD - LDL 118. If she was compliant with atorvastatin and zetia at home, would recommend [id number] at this point. Defer to Dr. Harrell Gave as outpatient.   For questions or updates, please contact [place] Please consult [email or web address] for contact info under      Signed, Elouise Munroe, MD  04/29/2020, 11:15 AM    CRITICAL CARE Performed by: Cherlynn Kaiser, MD   Total critical care time: 35 minutes   Critical care time was exclusive of separately billable procedures and treating other patients. Critical care was necessary to treat or prevent imminent or life-threatening deterioration. Critical care was time spent personally by me (independent of APPs or residents) on the following activities: development of treatment plan with patient and/or surrogate as well as nursing, discussions with  consultants, evaluation of patient's response to treatment, examination of patient, obtaining history from patient or surrogate, ordering and performing treatments and interventions, ordering and review of laboratory studies, ordering and review of radiographic studies, pulse oximetry and re-evaluation of patient's condition.

## 2020-04-29 NOTE — Progress Notes (Signed)
  Echocardiogram 2D Echocardiogram with contrast added to previous images.  Merrie Roof F 04/29/2020, 4:44 PM

## 2020-04-29 NOTE — Progress Notes (Signed)
  Echocardiogram 2D Echocardiogram with contrast has been performed.  Kathryn Bruce F 04/29/2020, 12:59 PM

## 2020-04-30 ENCOUNTER — Encounter (HOSPITAL_COMMUNITY): Payer: Self-pay | Admitting: Cardiovascular Disease

## 2020-04-30 DIAGNOSIS — E7800 Pure hypercholesterolemia, unspecified: Secondary | ICD-10-CM

## 2020-04-30 DIAGNOSIS — I10 Essential (primary) hypertension: Secondary | ICD-10-CM

## 2020-04-30 DIAGNOSIS — I2109 ST elevation (STEMI) myocardial infarction involving other coronary artery of anterior wall: Secondary | ICD-10-CM | POA: Diagnosis not present

## 2020-04-30 LAB — BASIC METABOLIC PANEL
Anion gap: 10 (ref 5–15)
BUN: 11 mg/dL (ref 8–23)
CO2: 21 mmol/L — ABNORMAL LOW (ref 22–32)
Calcium: 8.6 mg/dL — ABNORMAL LOW (ref 8.9–10.3)
Chloride: 109 mmol/L (ref 98–111)
Creatinine, Ser: 0.6 mg/dL (ref 0.44–1.00)
GFR, Estimated: >60 mL/min (ref >60)
Glucose, Bld: 123 mg/dL — ABNORMAL HIGH (ref 70–99)
Potassium: 4 mmol/L (ref 3.5–5.1)
Sodium: 140 mmol/L (ref 135–145)

## 2020-04-30 LAB — CBC
HCT: 36.1 % (ref 36.0–46.0)
Hemoglobin: 12 g/dL (ref 12.0–15.0)
MCH: 27.6 pg (ref 26.0–34.0)
MCHC: 33.2 g/dL (ref 30.0–36.0)
MCV: 83 fL (ref 80.0–100.0)
Platelets: 320 10*3/uL (ref 150–400)
RBC: 4.35 MIL/uL (ref 3.87–5.11)
RDW: 14.9 % (ref 11.5–15.5)
WBC: 9.6 10*3/uL (ref 4.0–10.5)
nRBC: 0 % (ref 0.0–0.2)

## 2020-04-30 LAB — MAGNESIUM: Magnesium: 1.7 mg/dL (ref 1.7–2.4)

## 2020-04-30 LAB — POCT ACTIVATED CLOTTING TIME: Activated Clotting Time: 309 seconds

## 2020-04-30 LAB — GLUCOSE, CAPILLARY: Glucose-Capillary: 136 mg/dL — ABNORMAL HIGH (ref 70–99)

## 2020-04-30 MED ORDER — NICOTINE 21 MG/24HR TD PT24: 21.0000 mg | MEDICATED_PATCH | Freq: Every day | TRANSDERMAL | 0 refills | Status: DC

## 2020-04-30 MED ORDER — NICOTINE 7 MG/24HR TD PT24: 7.0000 mg | MEDICATED_PATCH | Freq: Every day | TRANSDERMAL | 0 refills | Status: DC

## 2020-04-30 MED ORDER — METOPROLOL TARTRATE 25 MG PO TABS
25.0000 mg | ORAL_TABLET | Freq: Two times a day (BID) | ORAL | Status: DC
  Administered 2020-04-30: 25 mg via ORAL
  Filled 2020-04-30: qty 1

## 2020-04-30 MED ORDER — METOPROLOL TARTRATE 25 MG PO TABS: 25.0000 mg | ORAL_TABLET | Freq: Two times a day (BID) | ORAL | 2 refills | Status: DC

## 2020-04-30 MED ORDER — NICOTINE 21 MG/24HR TD PT24
21.0000 mg | MEDICATED_PATCH | Freq: Every day | TRANSDERMAL | Status: DC
  Administered 2020-04-30: 21 mg via TRANSDERMAL
  Filled 2020-04-30: qty 1

## 2020-04-30 MED ORDER — NICOTINE 7 MG/24HR TD PT24: 7.0000 mg | MEDICATED_PATCH | Freq: Every day | TRANSDERMAL | Status: DC

## 2020-04-30 MED ORDER — NICOTINE 14 MG/24HR TD PT24: 14.0000 mg | MEDICATED_PATCH | Freq: Every day | TRANSDERMAL | 0 refills | Status: DC

## 2020-04-30 MED ORDER — TICAGRELOR 90 MG PO TABS: 90.0000 mg | ORAL_TABLET | Freq: Two times a day (BID) | ORAL | 3 refills | Status: DC

## 2020-04-30 MED ORDER — ATORVASTATIN CALCIUM 80 MG PO TABS: 80.0000 mg | ORAL_TABLET | Freq: Every day | ORAL | 2 refills | Status: DC

## 2020-04-30 MED ORDER — LISINOPRIL 10 MG PO TABS
10.0000 mg | ORAL_TABLET | Freq: Every day | ORAL | Status: DC
  Administered 2020-04-30: 10 mg via ORAL
  Filled 2020-04-30: qty 1

## 2020-04-30 MED ORDER — LISINOPRIL 10 MG PO TABS: 10.0000 mg | ORAL_TABLET | Freq: Every day | ORAL | 2 refills | Status: DC

## 2020-04-30 MED ORDER — NICOTINE 14 MG/24HR TD PT24: 14.0000 mg | MEDICATED_PATCH | Freq: Every day | TRANSDERMAL | Status: DC

## 2020-04-30 NOTE — Discharge Summary (Signed)
Discharge Summary    Patient ID: Kathryn Bruce MRN: [id number]; DOB: [birth year]  Admit date: 04/28/2020 Discharge date: 04/30/2020  PCP:  Kathryn Bruce, [place]  Cardiologist:  Kathryn Dresser, MD   Discharge Diagnoses    Principal Problem:   Acute ST elevation myocardial infarction (STEMI) of anterior wall [facility]) Active Problems:   Hypertension   Tobacco use   Mixed hyperlipidemia   Obesity (BMI 30-39.9)   Type 2 diabetes mellitus with obesity ([place])   STEMI involving left anterior descending coronary artery [facility])  Diagnostic Studies/Procedures    LHC 04/28/2020:   Dist Cx lesion is 30% stenosed.  1st Mrg lesion is 20% stenosed.  Prox RCA lesion is 10% stenosed.  Dist LAD lesion is 100% stenosed.  Post intervention, there is a 0% residual stenosis.  Prox LAD to Mid LAD lesion is 80% stenosed.  The left ventricular systolic function is normal.  LV end diastolic pressure is normal.  The left ventricular ejection fraction is greater than 65% by visual estimate.  Acute ST segment elevation myocardial infarction secondary to thrombotic subtotal occlusion within the previously placed proximal LAD stent with probable apical distal embolization.  Mild concomitant nonobstructive CAD involving the left circumflex 20% mild narrowings in the OM1 vessel and AV groove and mild irregularity in the mid RCA.  Successful PCI to the thrombotic lesion in the previously placed Xience 3.5 x 15 mm stent treated with pliant balloon dilatation up to 3.5 mm. Residual stenosis 0 with brisk TIMI-3 flow. However there is still probable abrupt cut off at the very apex of the LAD due to initial thrombus embolization  Hyperdynamic LV function with EF estimate greater than 65% without focal wall motion abnormality. LVEDP 12 mm.  RECOMMENDATION: DAPT indefinitely in this patient who had previously undergone stenting of her proximal LAD with  subsequent Plavix discontinuation. Continue Aggrastat 18 hours post procedure. Aggressive lipid-lowering therapy and optimal blood pressure control. Smoking cessation is essential.  Diagnostic Dominance: Right    Intervention        Echo 04/29/20:  1. Left ventricular ejection fraction, by estimation, is 50 to 55%. The  left ventricle has low normal function. The left ventricle demonstrates  regional wall motion abnormalities (see scoring diagram/findings for  description). Akinesis of apex. There is  mild left ventricular hypertrophy. Left ventricular diastolic parameters  are indeterminate.  2. Right ventricular systolic function is normal. The right ventricular  size is normal. Tricuspid regurgitation signal is inadequate for assessing  PA pressure.  3. The mitral valve is abnormal. Trivial mitral valve regurgitation. No  evidence of mitral stenosis. Moderate mitral annular calcification.  4. The aortic valve is tricuspid. Aortic valve regurgitation is not  visualized. Mild aortic valve sclerosis is present, with no evidence of  aortic valve stenosis.  5. Aortic dilatation noted. There is mild dilatation of the ascending  aorta, measuring 38 mm.  6. The inferior vena cava is normal in size with greater than 50%  respiratory variability, suggesting right atrial pressure of 3 mmHg.  _____________   History of Present Illness     Kathryn Bruce is a 75 y.o. female with with a history of NSTEMI 09/2017 s/p DES to proximal LAD, T2DM, HLD, tobacco use, obesity, OSA (STOP-BANG 5), Covid 03/2019.   [name] presented to [facility] on 04/28/2020 with acute onset of a substernal chest pain similar to prior angina with left arm involvement along with shortness of breath and nausea.  Given her history, patient called EMS for transport to the ED for further evaluation.  At that time, she was given ASA 81x4 and 1 sublingual nitroglycerin however nitro caused significant  hypotension.  She was started on IV fluids with improvement.  EKG showed clear anterior ST elevation at 3-4 mm with persistent chest pain therefore STEMI team was called in for emergent LHC.  Hospital Course     Cardiac catheterization performed 04/28/2020 which showed a thrombotic subtotal occlusion within the previously placed proximal LAD stent with probable apical distal embolization.  There was concomitant nonobstructive CAD involving the LCx a 20% with mild narrowings in the OM1 vessel in the AV groove with mild irregularities in the mid RCA.  Successful PCI to the left thrombotic lesion in the previously placed stent was performed.  Recommendations were for DAPT indefinitely due to thrombosis in the proximal LAD after Plavix discontinuation.  She was continued on Aggrastat for 18 hours post procedure.  Echocardiogram performed 04/29/2020 with normal LVEF of 55 to 55% with akinesis of the apex, mild LVH, trivial MR, mild aortic sclerosis with no stenosis and mild aortic dilatation at 38 mm.  There is significant right anterior arm ecchymosis at cath site with no signs or symptoms of hematoma.  Will need close outpatient follow-up of CBC which is stable today on discharge.  Hospital issues include:  Acute anterior STEMI: -Patient presented with acute anterior STEMI with  successful PCI to the thrombotic lesion in the previously placed Xience 3.5 x 15 mm stent treated with pliant balloon dilatation up to 3.5 mm.  -Plan for indefinite DAPT due to thrombosis  -Echo with normal LVEF and akinesis of apex with LVH, and moderate aortic dilation at 11mm.  -Continue ASA, Plavix, statin and beta blocker -Needs aggressive risk factor modification as below  -Creatinine, 0.60 on 04/30/20>>BMET at follow-up.  HTN: -Elevated, 155/94>>133/78>>145/83 -Lisinopril 5 mg p.o. daily added to regimen along with metoprolol tartrate 12.5 mg twice daily>>up titrate metoprolol to 25mg  BID. Lisinopril ultimately  uptitrated to 10 mg p.o. daily on day of discharge. -Creatinine stable at 0.60 with plans for outpatient BMET    HLD: -LDL this admission, 118 with an LDL goal <70 -Unclear if patient was compliant with atorvastatin and Zetia. Restarted this admission however may benefit from lipid clinic referral for PCSK9 inhibitor -Repeat labs in 6 to 8 weeks -Any high intensity atorvastatin  DM2: -HbA1c, 6.8 -SSI while inpatient -On PTA metformin -Needs close outpatient PCP follow-up  Tobacco Use: -Cessation strongly encouraged -Will supply nicotine patch at d/c   Right cath site ecchymosis: -Stable with no hematoma -CBC 13.3>>>11.9 on 04/29/20 -No s/s of active bleeding  -Follow CBC trend  Leukocytosis: -No fever>>suspect int he setting of acute STEMI -Follow labs>>monitor for fever  Consultants: None   The patient has been seen and examined by Dr. Oval Linsey who feels that she is stable and ready for discharge today.  She is ambulated with cardiac rehabilitation without angina.  Cath site continues to have significant ecchymosis however there is no hematoma present.  We will need to follow CBC in the outpatient setting.  Did the patient have an acute coronary syndrome (MI, NSTEMI, STEMI, etc) this admission?:  Yes                               AHA/ACC Clinical Performance & Quality Measures: 1. Aspirin prescribed? - Yes 2. ADP Receptor Inhibitor (Plavix/Clopidogrel, Brilinta/Ticagrelor or  Effient/Prasugrel) prescribed (includes medically managed patients)? - Yes 3. Beta Blocker prescribed? - Yes 4. High Intensity Statin (Lipitor 40-80mg  or Crestor 20-40mg ) prescribed? - Yes 5. EF assessed during THIS hospitalization? - Yes 6. For EF <40%, was ACEI/ARB prescribed? - Not Applicable (EF >/= 96%) 7. For EF <40%, Aldosterone Antagonist (Spironolactone or Eplerenone) prescribed? - Not Applicable (EF >/= 75%) 8. Cardiac Rehab Phase II ordered (including medically managed patients)? - Yes   ____________  Discharge Vitals Blood pressure (!) 149/66, pulse 64, temperature 98.1 F (36.7 C), resp. rate (!) 22, height 5\' 5"  (1.651 m), weight 95.3 kg, SpO2 100 %.  Filed Weights   04/28/20 0702 04/28/20 1100  Weight: (!) 139.3 kg 95.3 kg    Labs & Radiologic Studies    CBC Recent Labs    04/28/20 0701 04/28/20 0809 04/29/20 0140 04/30/20 0922  WBC 18.9*  --  12.2* 9.6  NEUTROABS 15.5*  --   --   --   HGB 13.3   < > 11.9* 12.0  HCT 41.3   < > 37.6 36.1  MCV 85.0  --  83.6 83.0  PLT 429*  --  374 320   < > = values in this interval not displayed.   Basic Metabolic Panel Recent Labs    04/29/20 0140 04/30/20 0051  NA 141 140  K 3.1* 4.0  CL 107 109  CO2 26 21*  GLUCOSE 122* 123*  BUN 7* 11  CREATININE 0.59 0.60  CALCIUM 8.2* 8.6*  MG  --  1.7   Liver Function Tests Recent Labs    04/28/20 0701  AST 14*  ALT 11  ALKPHOS 71  BILITOT 0.7  PROT 5.9*  ALBUMIN 3.1*   No results for input(s): LIPASE, AMYLASE in the last 72 hours. High Sensitivity Troponin:   Recent Labs  Lab 04/28/20 0701 04/28/20 1141  TROPONINIHS 7 7,424*    BNP Invalid input(s): POCBNP D-Dimer No results for input(s): DDIMER in the last 72 hours. Hemoglobin A1C Recent Labs    04/28/20 0701  HGBA1C 6.8*   Fasting Lipid Panel Recent Labs    04/28/20 0701  CHOL 203*  HDL 52  LDLCALC 118*  TRIG 165*  CHOLHDL 3.9   Thyroid Function Tests No results for input(s): TSH, T4TOTAL, T3FREE, THYROIDAB in the last 72 hours.  Invalid input(s): FREET3 _____________  CARDIAC CATHETERIZATION  Addendum Date: 04/28/2020    Dist Cx lesion is 30% stenosed.  1st Mrg lesion is 20% stenosed.  Prox RCA lesion is 10% stenosed.  Dist LAD lesion is 100% stenosed.  Post intervention, there is a 0% residual stenosis.  Prox LAD to Mid LAD lesion is 80% stenosed.  The left ventricular systolic function is normal.  LV end diastolic pressure is normal.  The left ventricular ejection  fraction is greater than 65% by visual estimate.  Acute ST segment elevation myocardial infarction secondary to thrombotic subtotal occlusion within the previously placed proximal LAD stent with probable apical distal embolization. Mild concomitant nonobstructive CAD involving the left circumflex 20% mild narrowings in the OM1 vessel and AV groove and mild irregularity in the mid RCA. Successful PCI to the thrombotic lesion in the previously placed Xience 3.5 x 15 mm stent treated with pliant balloon dilatation up to 3.5 mm.  Residual stenosis 0 with brisk TIMI-3 flow.  However there is still probable abrupt cut off at the very apex of the LAD due to initial thrombus embolization Hyperdynamic LV function with EF estimate greater than  65% without focal wall motion abnormality. LVEDP 12 mm. RECOMMENDATION: DAPT indefinitely in this patient who had previously undergone stenting of her proximal LAD with subsequent Plavix discontinuation.  Continue Aggrastat 18 hours post procedure.  Aggressive lipid-lowering therapy and optimal blood pressure control.  Smoking cessation is essential.   Result Date: 04/28/2020  Dist Cx lesion is 30% stenosed.  1st Mrg lesion is 20% stenosed.  Prox RCA lesion is 10% stenosed.  Dist LAD lesion is 100% stenosed.  Post intervention, there is a 0% residual stenosis.  Prox LAD to Mid LAD lesion is 80% stenosed.  The left ventricular systolic function is normal.  LV end diastolic pressure is normal.  The left ventricular ejection fraction is greater than 65% by visual estimate.  Acute ST segment elevation myocardial infarction secondary to thrombotic subtotal occlusion within the previously placed proximal LAD stent with probable apical distal embolization. Mild concomitant nonobstructive CAD involving the left circumflex 20% mild narrowings in the OM1 vessel and AV groove and mild irregularity in the mid RCA. Successful PCI to the thrombotic lesion in the previously placed Xience  3.5 x 15 mm stent treated with pliant balloon dilatation up to 3.5 mm.  Residual stenosis 0 with brisk TIMI-3 flow.  However there is still probable abrupt cut off at the very apex of the LAD due to initial thrombus embolization Hyperdynamic LV function with EF estimate greater than 65% without focal wall motion abnormality. LVEDP 12 mm. RECOMMENDATION: DAPT indefinitely in this patient who had previously undergone stenting of her proximal LAD with subsequent Plavix discontinuation.  Aggressive lipid-lowering therapy and optimal blood pressure control.   ECHOCARDIOGRAM COMPLETE  Result Date: 04/29/2020    ECHOCARDIOGRAM REPORT   Patient Name:   Kathryn Bruce Date of Exam: 04/29/2020 Medical Rec #:  [id number]      Height:       65.0 in Accession #:    [id number]     Weight:       210.0 lb Date of Birth:  [birth year]     BSA:          2.020 m Patient Age:    9 years       BP:           157/66 mmHg Patient Gender: F              HR:           56 bpm. Exam Location:  Inpatient Procedure: 2D Echo, Color Doppler and Limited Color Doppler Indications:    STEMI  History:        Patient has prior history of Echocardiogram examinations, most                 recent 03/13/2019. H/O Covid 19.  Sonographer:    Merrie Roof RDCS Referring Phys: [id number] [place]  1. Left ventricular ejection fraction, by estimation, is 50 to 55%. The left ventricle has low normal function. The left ventricle demonstrates regional wall motion abnormalities (see scoring diagram/findings for description). Akinesis of apex. There is  mild left ventricular hypertrophy. Left ventricular diastolic parameters are indeterminate.  2. Right ventricular systolic function is normal. The right ventricular size is normal. Tricuspid regurgitation signal is inadequate for assessing PA pressure.  3. The mitral valve is abnormal. Trivial mitral valve regurgitation. No evidence of mitral stenosis. Moderate mitral annular calcification.  4. The  aortic valve is tricuspid. Aortic valve regurgitation is not visualized. Mild aortic valve sclerosis is present,  with no evidence of aortic valve stenosis.  5. Aortic dilatation noted. There is mild dilatation of the ascending aorta, measuring 38 mm.  6. The inferior vena cava is normal in size with greater than 50% respiratory variability, suggesting right atrial pressure of 3 mmHg. FINDINGS  Left Ventricle: Left ventricular ejection fraction, by estimation, is 50 to 55%. The left ventricle has low normal function. The left ventricle demonstrates regional wall motion abnormalities. The left ventricular internal cavity size was normal in size. There is mild left ventricular hypertrophy. Left ventricular diastolic parameters are indeterminate.  LV Wall Scoring: The apical septal segment, apical anterior segment, apical inferior segment, and apex are akinetic. The anterior wall, entire lateral wall, anterior septum, inferior wall, mid inferoseptal segment, and basal inferoseptal segment are normal. Right Ventricle: The right ventricular size is normal. No increase in right ventricular wall thickness. Right ventricular systolic function is normal. Tricuspid regurgitation signal is inadequate for assessing PA pressure. Left Atrium: Left atrial size was normal in size. Right Atrium: Right atrial size was normal in size. Pericardium: There is no evidence of pericardial effusion. Mitral Valve: The mitral valve is abnormal. Moderate mitral annular calcification. Trivial mitral valve regurgitation. No evidence of mitral valve stenosis. Tricuspid Valve: The tricuspid valve is normal in structure. Tricuspid valve regurgitation is trivial. Aortic Valve: The aortic valve is tricuspid. Aortic valve regurgitation is not visualized. Mild aortic valve sclerosis is present, with no evidence of aortic valve stenosis. Aortic valve mean gradient measures 8.0 mmHg. Aortic valve peak gradient measures 16.8 mmHg. Aortic valve area, by VTI  measures 1.93 cm. Pulmonic Valve: The pulmonic valve was not well visualized. Pulmonic valve regurgitation is trivial. Aorta: The aortic root is normal in size and structure and aortic dilatation noted. There is mild dilatation of the ascending aorta, measuring 38 mm. Venous: The inferior vena cava is normal in size with greater than 50% respiratory variability, suggesting right atrial pressure of 3 mmHg. IAS/Shunts: The interatrial septum was not well visualized.  LEFT VENTRICLE PLAX 2D LVIDd:         4.50 cm     Diastology LVIDs:         2.90 cm     LV e' medial:    8.27 cm/s LV PW:         1.20 cm     LV E/e' medial:  14.3 LV IVS:        1.10 cm     LV e' lateral:   7.07 cm/s LVOT diam:     1.90 cm     LV E/e' lateral: 16.7 LV SV:         81 LV SV Index:   40 LVOT Area:     2.84 cm  LV Volumes (MOD) LV vol d, MOD A4C: 69.9 ml LV vol s, MOD A4C: 26.3 ml LV SV MOD A4C:     69.9 ml RIGHT VENTRICLE          IVC RV Basal diam:  3.40 cm  IVC diam: 1.60 cm LEFT ATRIUM             Index       RIGHT ATRIUM           Index LA diam:        3.70 cm 1.83 cm/m  RA Area:     15.20 cm LA Vol (A2C):   64.4 ml 31.89 ml/m RA Volume:   38.10 ml  18.87 ml/m LA Vol (A4C):  55.0 ml 27.23 ml/m LA Biplane Vol: 59.4 ml 29.41 ml/m  AORTIC VALVE AV Area (Vmax):    1.58 cm AV Area (Vmean):   1.59 cm AV Area (VTI):     1.93 cm AV Vmax:           205.00 cm/s AV Vmean:          135.000 cm/s AV VTI:            0.422 m AV Peak Grad:      16.8 mmHg AV Mean Grad:      8.0 mmHg LVOT Vmax:         114.00 cm/s LVOT Vmean:        75.600 cm/s LVOT VTI:          0.287 m LVOT/AV VTI ratio: 0.68  AORTA Ao Root diam: 3.40 cm Ao Asc diam:  3.80 cm MITRAL VALVE MV Area (PHT): 3.37 cm     SHUNTS MV Decel Time: 225 msec     Systemic VTI:  0.29 m MV E velocity: 118.00 cm/s  Systemic Diam: 1.90 cm MV A velocity: 124.00 cm/s MV E/A ratio:  0.95 Oswaldo Milian MD Electronically signed by Oswaldo Milian MD Signature Date/Time:  04/29/2020/5:02:53 PM    Final    Disposition   Pt is being discharged home today in good condition.  Follow-up Plans & Appointments     Follow-up Information    Kathryn Dresser, MD Follow up on 05/03/2020.   Specialty: Cardiology Why: at [facility] information: [place] [address] [phone]              Discharge Instructions    Call MD for:  difficulty breathing, headache or visual disturbances   Complete by: As directed    Call MD for:  extreme fatigue   Complete by: As directed    Call MD for:  hives   Complete by: As directed    Call MD for:  persistant dizziness or light-headedness   Complete by: As directed    Call MD for:  persistant nausea and vomiting   Complete by: As directed    Call MD for:  redness, tenderness, or signs of infection (pain, swelling, redness, odor or green/yellow discharge around incision site)   Complete by: As directed    Call MD for:  severe uncontrolled pain   Complete by: As directed    Call MD for:  temperature >100.4   Complete by: As directed    Diet - low sodium heart healthy   Complete by: As directed    Discharge instructions   Complete by: As directed    PLEASE DO NOT [place]!!!! Also keep a log of you blood pressures and bring back to your follow up appt. Please call the office with any questions.   Patients taking blood thinners should generally stay away from medicines like ibuprofen, Advil, Motrin, naproxen, and Aleve due to risk of stomach bleeding. You may take Tylenol as directed or talk to your primary doctor about alternatives.  PLEASE ENSURE THAT YOU DO NOT RUN OUT OF YOUR BRILINTA. IF you have issues obtaining this medication due to cost please CALL the office 3-5 business days prior to running out in order to prevent missing doses of this medication.   No driving for 5 days. No lifting over 5 lbs for 1 week. No sexual activity for 1 week. Keep  procedure site clean & dry. If  you notice increased pain, swelling, bleeding or pus, call/return!  You may shower, but no soaking baths/hot tubs/pools for 1 week.   Increase activity slowly   Complete by: As directed       Discharge Medications   Allergies as of 04/30/2020   No Known Allergies     Medication List    STOP taking these medications   lisinopril-hydrochlorothiazide 20-25 MG tablet Commonly known as: ZESTORETIC     TAKE these medications   acetaminophen 500 MG tablet Commonly known as: TYLENOL Take 1,000 mg by mouth every 8 (eight) hours as needed (pain).   aspirin 81 MG EC tablet Take 1 tablet (81 mg total) by mouth daily.   atorvastatin 80 MG tablet Commonly known as: LIPITOR Take 1 tablet (80 mg total) by mouth daily. Start taking on: May 01, 2020 What changed:   medication strength  how much to take   DULoxetine 30 MG capsule Commonly known as: CYMBALTA Take 60 mg by mouth daily.   ezetimibe 10 MG tablet Commonly known as: ZETIA Take 1 tablet (10 mg total) by mouth daily.   glipiZIDE 5 MG 24 hr tablet Commonly known as: GLUCOTROL XL Take 5 mg by mouth daily.   lisinopril 10 MG tablet Commonly known as: ZESTRIL Take 1 tablet (10 mg total) by mouth daily. Start taking on: May 01, 2020   metFORMIN 500 MG tablet Commonly known as: GLUCOPHAGE Take 1,000 mg by mouth 2 (two) times daily with a meal.   metoprolol tartrate 25 MG tablet Commonly known as: LOPRESSOR Take 1 tablet (25 mg total) by mouth 2 (two) times daily.   nicotine 21 mg/24hr patch Commonly known as: NICODERM CQ - dosed in mg/24 hours Place 1 patch (21 mg total) onto the skin daily.   nicotine 14 mg/24hr patch Commonly known as: NICODERM CQ - dosed in mg/24 hours Place 1 patch (14 mg total) onto the skin daily. Start taking on: June 11, 2020   nicotine 7 mg/24hr patch Commonly known as: NICODERM CQ - dosed in mg/24 hr Place 1 patch (7 mg total) onto the skin  daily. Start taking on: June 25, 2020   nitroGLYCERIN 0.4 MG SL tablet Commonly known as: NITROSTAT Place 1 tablet (0.4 mg total) under the tongue every 5 (five) minutes x 3 doses as needed for chest pain.   omeprazole 40 MG capsule Commonly known as: PRILOSEC Take 40 mg by mouth daily.   ticagrelor 90 MG Tabs tablet Commonly known as: BRILINTA Take 1 tablet (90 mg total) by mouth 2 (two) times daily.          Outstanding Labs/Studies   BMET, CBC   Duration of Discharge Encounter   Greater than 30 minutes including physician time.  Signed, Kathyrn Drown, NP 04/30/2020, 10:57 AM

## 2020-04-30 NOTE — Progress Notes (Signed)
CARDIAC REHAB PHASE I   PRE:  Rate/Rhythm: 62 SR    BP: sitting 149/66    SaO2: 100 RA  MODE:  Ambulation: 290 ft   POST:  Rate/Rhythm: 83 SR    BP: sitting 164/69     SaO2: 98 RA  Pt able to ambulate with min assist, gait belt support. Slow and steady. Denied CP or SOB. To BR then recliner. VSS. In depth discussion of MI, angioplasty, Brilinta importance, restrictions, smoking cessation, diet, exercise, NTG and CRPII. Pt very receptive. She sts she quit smoking on arrival and wants to go home on patches. Resources given. Pt now admits that she did not take her cholesterol med for approximately 1 week before admit. Will refer to [place].   [phone]  [place], ACSM 04/30/2020 11:07 AM

## 2020-04-30 NOTE — Progress Notes (Signed)
Patient discharged to home w/son.   Transported to vehicle via wheelchair. Pt. Awake, alert and oriented at time of discharge.  Discharge instructions given w/all questions and concerns addressed and answered.  PIVs d/c'd and dressings in place.

## 2020-04-30 NOTE — Progress Notes (Signed)
Progress Note  Patient Name: ARLYNN STARE Date of Encounter: 04/30/2020  Primary Cardiologist: Buford Dresser, MD   Subjective   Denies recurrent chest pain. Has ambulated in the room. Will transfer to tele today   Inpatient Medications    Scheduled Meds: . aspirin  81 mg Oral Daily  . atorvastatin  80 mg Oral Daily  . Chlorhexidine Gluconate Cloth  6 each Topical Daily  . enoxaparin (LOVENOX) injection  40 mg Subcutaneous Q24H  . ezetimibe  10 mg Oral Daily  . insulin aspart  0-15 Units Subcutaneous TID WC  . insulin aspart  0-5 Units Subcutaneous QHS  . lisinopril  5 mg Oral Daily  . metoprolol tartrate  12.5 mg Oral BID  . potassium chloride  40 mEq Oral BID  . sodium chloride flush  3 mL Intravenous Q12H  . ticagrelor  90 mg Oral BID   Continuous Infusions: . sodium chloride    . sodium chloride Stopped (04/29/20 0425)  . sodium chloride 10 mL/hr at 04/29/20 0700   PRN Meds: sodium chloride, acetaminophen, diazepam, ondansetron (ZOFRAN) IV, sodium chloride flush   Vital Signs    Vitals:   04/30/20 0400 04/30/20 0500 04/30/20 0600 04/30/20 0700  BP: (!) 146/61 (!) 145/83 133/78 (!) 155/94  Pulse: 62 (!) 54 61 72  Resp: (!) 26 17    Temp: 97.7 F (36.5 C)     TempSrc: Oral     SpO2: 95% 100% 96% 90%  Weight:      Height:        Intake/Output Summary (Last 24 hours) at 04/30/2020 0742 Last data filed at 04/30/2020 0700 Gross per 24 hour  Intake 960 ml  Output 1500 ml  Net -540 ml   Filed Weights   04/28/20 0702 04/28/20 1100  Weight: (!) 139.3 kg 95.3 kg    Physical Exam   General: Well developed, well nourished, NAD Lungs:Clear to ausculation bilaterally. Breathing is unlabored. Cardiovascular: RRR with S1 S2. No murmurs Abdomen: Soft, non-tender, non-distended. No obvious abdominal masses. Extremities: No edema. Large right arm ecchymosis>>no hematoma. Neuro: Alert and oriented. No focal deficits. No facial asymmetry. MAE  spontaneously. Psych: Responds to questions appropriately with normal affect.    Labs    Chemistry Recent Labs  Lab 04/28/20 0701 04/28/20 0809 04/29/20 0140 04/30/20 0051  NA 139 142 141 140  K 3.6 2.9* 3.1* 4.0  CL 101 102 107 109  CO2 25  --  26 21*  GLUCOSE 194* 179* 122* 123*  BUN 15 16 7* 11  CREATININE 0.91 0.50 0.59 0.60  CALCIUM 8.7*  --  8.2* 8.6*  PROT 5.9*  --   --   --   ALBUMIN 3.1*  --   --   --   AST 14*  --   --   --   ALT 11  --   --   --   ALKPHOS 71  --   --   --   BILITOT 0.7  --   --   --   GFRNONAA >60  --  >60 >60  ANIONGAP 13  --  8 10     Hematology Recent Labs  Lab 04/28/20 0701 04/28/20 0809 04/29/20 0140  WBC 18.9*  --  12.2*  RBC 4.86  --  4.50  HGB 13.3 13.3 11.9*  HCT 41.3 39.0 37.6  MCV 85.0  --  83.6  MCH 27.4  --  26.4  MCHC 32.2  --  31.6  RDW 14.7  --  14.7  PLT 429*  --  374    Cardiac EnzymesNo results for input(s): TROPONINI in the last 168 hours. No results for input(s): TROPIPOC in the last 168 hours.   BNPNo results for input(s): BNP, PROBNP in the last 168 hours.   DDimer No results for input(s): DDIMER in the last 168 hours.   Radiology    CARDIAC CATHETERIZATION  Addendum Date: 04/28/2020    Dist Cx lesion is 30% stenosed.  1st Mrg lesion is 20% stenosed.  Prox RCA lesion is 10% stenosed.  Dist LAD lesion is 100% stenosed.  Post intervention, there is a 0% residual stenosis.  Prox LAD to Mid LAD lesion is 80% stenosed.  The left ventricular systolic function is normal.  LV end diastolic pressure is normal.  The left ventricular ejection fraction is greater than 65% by visual estimate.  Acute ST segment elevation myocardial infarction secondary to thrombotic subtotal occlusion within the previously placed proximal LAD stent with probable apical distal embolization. Mild concomitant nonobstructive CAD involving the left circumflex 20% mild narrowings in the OM1 vessel and AV groove and mild irregularity in  the mid RCA. Successful PCI to the thrombotic lesion in the previously placed Xience 3.5 x 15 mm stent treated with pliant balloon dilatation up to 3.5 mm.  Residual stenosis 0 with brisk TIMI-3 flow.  However there is still probable abrupt cut off at the very apex of the LAD due to initial thrombus embolization Hyperdynamic LV function with EF estimate greater than 65% without focal wall motion abnormality. LVEDP 12 mm. RECOMMENDATION: DAPT indefinitely in this patient who had previously undergone stenting of her proximal LAD with subsequent Plavix discontinuation.  Continue Aggrastat 18 hours post procedure.  Aggressive lipid-lowering therapy and optimal blood pressure control.  Smoking cessation is essential.   Result Date: 04/28/2020  Dist Cx lesion is 30% stenosed.  1st Mrg lesion is 20% stenosed.  Prox RCA lesion is 10% stenosed.  Dist LAD lesion is 100% stenosed.  Post intervention, there is a 0% residual stenosis.  Prox LAD to Mid LAD lesion is 80% stenosed.  The left ventricular systolic function is normal.  LV end diastolic pressure is normal.  The left ventricular ejection fraction is greater than 65% by visual estimate.  Acute ST segment elevation myocardial infarction secondary to thrombotic subtotal occlusion within the previously placed proximal LAD stent with probable apical distal embolization. Mild concomitant nonobstructive CAD involving the left circumflex 20% mild narrowings in the OM1 vessel and AV groove and mild irregularity in the mid RCA. Successful PCI to the thrombotic lesion in the previously placed Xience 3.5 x 15 mm stent treated with pliant balloon dilatation up to 3.5 mm.  Residual stenosis 0 with brisk TIMI-3 flow.  However there is still probable abrupt cut off at the very apex of the LAD due to initial thrombus embolization Hyperdynamic LV function with EF estimate greater than 65% without focal wall motion abnormality. LVEDP 12 mm. RECOMMENDATION: DAPT indefinitely in  this patient who had previously undergone stenting of her proximal LAD with subsequent Plavix discontinuation.  Aggressive lipid-lowering therapy and optimal blood pressure control.   ECHOCARDIOGRAM COMPLETE  Result Date: 04/29/2020    ECHOCARDIOGRAM REPORT   Patient Name:   ESMAY AMSPACHER Date of Exam: 04/29/2020 Medical Rec #:  [id number]      Height:       65.0 in Accession #:    [id number]     Weight:  210.0 lb Date of Birth:  March 19, 1945     BSA:          2.020 m Patient Age:    75 years       BP:           157/66 mmHg Patient Gender: F              HR:           56 bpm. Exam Location:  Inpatient Procedure: 2D Echo, Color Doppler and Limited Color Doppler Indications:    STEMI  History:        Patient has prior history of Echocardiogram examinations, most                 recent 03/13/2019. H/O Covid 19.  Sonographer:    Merrie Roof RDCS Referring Phys: [id number] [place]  1. Left ventricular ejection fraction, by estimation, is 50 to 55%. The left ventricle has low normal function. The left ventricle demonstrates regional wall motion abnormalities (see scoring diagram/findings for description). Akinesis of apex. There is  mild left ventricular hypertrophy. Left ventricular diastolic parameters are indeterminate.  2. Right ventricular systolic function is normal. The right ventricular size is normal. Tricuspid regurgitation signal is inadequate for assessing PA pressure.  3. The mitral valve is abnormal. Trivial mitral valve regurgitation. No evidence of mitral stenosis. Moderate mitral annular calcification.  4. The aortic valve is tricuspid. Aortic valve regurgitation is not visualized. Mild aortic valve sclerosis is present, with no evidence of aortic valve stenosis.  5. Aortic dilatation noted. There is mild dilatation of the ascending aorta, measuring 38 mm.  6. The inferior vena cava is normal in size with greater than 50% respiratory variability, suggesting right atrial pressure  of 3 mmHg. FINDINGS  Left Ventricle: Left ventricular ejection fraction, by estimation, is 50 to 55%. The left ventricle has low normal function. The left ventricle demonstrates regional wall motion abnormalities. The left ventricular internal cavity size was normal in size. There is mild left ventricular hypertrophy. Left ventricular diastolic parameters are indeterminate.  LV Wall Scoring: The apical septal segment, apical anterior segment, apical inferior segment, and apex are akinetic. The anterior wall, entire lateral wall, anterior septum, inferior wall, mid inferoseptal segment, and basal inferoseptal segment are normal. Right Ventricle: The right ventricular size is normal. No increase in right ventricular wall thickness. Right ventricular systolic function is normal. Tricuspid regurgitation signal is inadequate for assessing PA pressure. Left Atrium: Left atrial size was normal in size. Right Atrium: Right atrial size was normal in size. Pericardium: There is no evidence of pericardial effusion. Mitral Valve: The mitral valve is abnormal. Moderate mitral annular calcification. Trivial mitral valve regurgitation. No evidence of mitral valve stenosis. Tricuspid Valve: The tricuspid valve is normal in structure. Tricuspid valve regurgitation is trivial. Aortic Valve: The aortic valve is tricuspid. Aortic valve regurgitation is not visualized. Mild aortic valve sclerosis is present, with no evidence of aortic valve stenosis. Aortic valve mean gradient measures 8.0 mmHg. Aortic valve peak gradient measures 16.8 mmHg. Aortic valve area, by VTI measures 1.93 cm. Pulmonic Valve: The pulmonic valve was not well visualized. Pulmonic valve regurgitation is trivial. Aorta: The aortic root is normal in size and structure and aortic dilatation noted. There is mild dilatation of the ascending aorta, measuring 38 mm. Venous: The inferior vena cava is normal in size with greater than 50% respiratory variability, suggesting  right atrial pressure of 3 mmHg. IAS/Shunts: The interatrial septum was  not well visualized.  LEFT VENTRICLE PLAX 2D LVIDd:         4.50 cm     Diastology LVIDs:         2.90 cm     LV e' medial:    8.27 cm/s LV PW:         1.20 cm     LV E/e' medial:  14.3 LV IVS:        1.10 cm     LV e' lateral:   7.07 cm/s LVOT diam:     1.90 cm     LV E/e' lateral: 16.7 LV SV:         81 LV SV Index:   40 LVOT Area:     2.84 cm  LV Volumes (MOD) LV vol d, MOD A4C: 69.9 ml LV vol s, MOD A4C: 26.3 ml LV SV MOD A4C:     69.9 ml RIGHT VENTRICLE          IVC RV Basal diam:  3.40 cm  IVC diam: 1.60 cm LEFT ATRIUM             Index       RIGHT ATRIUM           Index LA diam:        3.70 cm 1.83 cm/m  RA Area:     15.20 cm LA Vol (A2C):   64.4 ml 31.89 ml/m RA Volume:   38.10 ml  18.87 ml/m LA Vol (A4C):   55.0 ml 27.23 ml/m LA Biplane Vol: 59.4 ml 29.41 ml/m  AORTIC VALVE AV Area (Vmax):    1.58 cm AV Area (Vmean):   1.59 cm AV Area (VTI):     1.93 cm AV Vmax:           205.00 cm/s AV Vmean:          135.000 cm/s AV VTI:            0.422 m AV Peak Grad:      16.8 mmHg AV Mean Grad:      8.0 mmHg LVOT Vmax:         114.00 cm/s LVOT Vmean:        75.600 cm/s LVOT VTI:          0.287 m LVOT/AV VTI ratio: 0.68  AORTA Ao Root diam: 3.40 cm Ao Asc diam:  3.80 cm MITRAL VALVE MV Area (PHT): 3.37 cm     SHUNTS MV Decel Time: 225 msec     Systemic VTI:  0.29 m MV E velocity: 118.00 cm/s  Systemic Diam: 1.90 cm MV A velocity: 124.00 cm/s MV E/A ratio:  0.95 Oswaldo Milian MD Electronically signed by Oswaldo Milian MD Signature Date/Time: 04/29/2020/5:02:53 PM    Final    Telemetry    04/30/20 NSR with HR in the 70's  - Personally Reviewed  ECG    No new tracing as of 04/30/20 - Personally Reviewed  Cardiac Studies   LHC:   Dist Cx lesion is 30% stenosed.  1st Mrg lesion is 20% stenosed.  Prox RCA lesion is 10% stenosed.  Dist LAD lesion is 100% stenosed.  Post intervention, there is a 0% residual  stenosis.  Prox LAD to Mid LAD lesion is 80% stenosed.  The left ventricular systolic function is normal.  LV end diastolic pressure is normal.  The left ventricular ejection fraction is greater than 65% by visual estimate.   Acute ST segment elevation myocardial infarction  secondary to thrombotic subtotal occlusion within the previously placed proximal LAD stent with probable apical distal embolization.  Mild concomitant nonobstructive CAD involving the left circumflex 20% mild narrowings in the OM1 vessel and AV groove and mild irregularity in the mid RCA.  Successful PCI to the thrombotic lesion in the previously placed Xience 3.5 x 15 mm stent treated with pliant balloon dilatation up to 3.5 mm.  Residual stenosis 0 with brisk TIMI-3 flow.  However there is still probable abrupt cut off at the very apex of the LAD due to initial thrombus embolization  Hyperdynamic LV function with EF estimate greater than 65% without focal wall motion abnormality. LVEDP 12 mm.  RECOMMENDATION: DAPT indefinitely in this patient who had previously undergone stenting of her proximal LAD with subsequent Plavix discontinuation.  Continue Aggrastat 18 hours post procedure.  Aggressive lipid-lowering therapy and optimal blood pressure control.  Smoking cessation is essential.   Echo 04/29/20:  1. Left ventricular ejection fraction, by estimation, is 50 to 55%. The  left ventricle has low normal function. The left ventricle demonstrates  regional wall motion abnormalities (see scoring diagram/findings for  description). Akinesis of apex. There is  mild left ventricular hypertrophy. Left ventricular diastolic parameters  are indeterminate.  2. Right ventricular systolic function is normal. The right ventricular  size is normal. Tricuspid regurgitation signal is inadequate for assessing  PA pressure.  3. The mitral valve is abnormal. Trivial mitral valve regurgitation. No  evidence of mitral  stenosis. Moderate mitral annular calcification.  4. The aortic valve is tricuspid. Aortic valve regurgitation is not  visualized. Mild aortic valve sclerosis is present, with no evidence of  aortic valve stenosis.  5. Aortic dilatation noted. There is mild dilatation of the ascending  aorta, measuring 38 mm.  6. The inferior vena cava is normal in size with greater than 50%  respiratory variability, suggesting right atrial pressure of 3 mmHg.   Patient Profile     75 y.o. female with a history of NSTEMI 09/2017 s/p DES to proximal LAD, T2DM, HLD, tobacco use, obesity, OSA,  Covid 03/2019 s/p Anterior STEMI.  Assessment & Plan    1.  Acute anterior STEMI: -Patient presented with acute anterior STEMI with  successful PCI to the thrombotic lesion in the previously placed Xience 3.5 x 15 mm stent treated with pliant balloon dilatation up to 3.5 mm.  -Plan for indefinite DAPT due to thrombosis  -Echo with normal LVEF and akinesis of apex with LVH, and moderate aortic dilation at 58mm.  -Continue ASA, Plavix, statin and beta blocker -Needs aggressive risk factor modification as below   2.  HTN: -Elevated, 155/94>>133/78>>145/83 -Lisinopril 5 mg p.o. daily added to regimen along with metoprolol tartrate 12.5 mg twice daily>>up titrate metoprolol to 25mg  BID. Likely will need further titration of antihypertensives   3.  HLD: -LDL this admission, 118 with an LDL goal <70 -Unclear if patient was compliant with atorvastatin and Zetia.  Restarted this admission however may benefit from lipid clinic referral for PCSK9 inhibitor -Repeat labs in 6 to 8 weeks  4. DM2: -HbA1c, 6.8 -SSI while inpatient -On PTA metformin -Would benefit from SGLT2  5. Tobacco Use: -Cessation strongly encouraged -Will supply nicotine patch   6. Right cath site ecchymosis: -Stable with no hematoma -CBC 13.3>>>11.9 on 04/29/20 -Follow CBC trend  7. Leukocytosis: -No fever>>suspect int he setting of acute  STEMI -Follow labs>>monitor for fever   Signed, Kathyrn Drown NP-C [place] Pager: [phone] 04/30/2020, 7:42 AM  For questions or updates, please contact   Please consult [email or web address] for contact info under Cardiology/STEMI.

## 2020-05-01 MED FILL — Verapamil HCl IV Soln 2.5 MG/ML: INTRAVENOUS | Qty: 2 | Status: AC

## 2020-05-01 MED FILL — Heparin Sod (Porcine)-NaCl IV Soln 1000 Unit/500ML-0.9%: INTRAVENOUS | Qty: 500 | Status: AC

## 2020-05-03 ENCOUNTER — Encounter: Payer: Self-pay | Admitting: Cardiology

## 2020-05-03 ENCOUNTER — Ambulatory Visit (INDEPENDENT_AMBULATORY_CARE_PROVIDER_SITE_OTHER): Payer: Medicare Other | Admitting: Cardiology

## 2020-05-03 ENCOUNTER — Other Ambulatory Visit: Payer: Self-pay

## 2020-05-03 VITALS — BP 150/80 | HR 56 | Ht 65.0 in | Wt 205.8 lb

## 2020-05-03 DIAGNOSIS — Z09 Encounter for follow-up examination after completed treatment for conditions other than malignant neoplasm: Secondary | ICD-10-CM

## 2020-05-03 DIAGNOSIS — I2510 Atherosclerotic heart disease of native coronary artery without angina pectoris: Secondary | ICD-10-CM

## 2020-05-03 DIAGNOSIS — I10 Essential (primary) hypertension: Secondary | ICD-10-CM | POA: Diagnosis not present

## 2020-05-03 DIAGNOSIS — E1169 Type 2 diabetes mellitus with other specified complication: Secondary | ICD-10-CM

## 2020-05-03 DIAGNOSIS — Z716 Tobacco abuse counseling: Secondary | ICD-10-CM | POA: Diagnosis not present

## 2020-05-03 DIAGNOSIS — Z720 Tobacco use: Secondary | ICD-10-CM | POA: Diagnosis not present

## 2020-05-03 DIAGNOSIS — E669 Obesity, unspecified: Secondary | ICD-10-CM

## 2020-05-03 DIAGNOSIS — E782 Mixed hyperlipidemia: Secondary | ICD-10-CM

## 2020-05-03 NOTE — Progress Notes (Signed)
Cardiology Office Note:    Date:  05/03/2020   ID:  Fleta, Borgeson 04-02-1945, MRN [id number]  PCP:  Maurice Small, MD  Cardiologist:  Buford Dresser, MD  Referring MD: Maurice Small, MD   Chief Complaint:  Follow up  History of Present Illness:    Kathryn Bruce is a 75 y.o. female with a history of prior COVID infection, CAD with NSTEMI in 09/2017 s/p DES to proximal LAD followed by STEMI 04/2020 due to in stent thrombosis, hypertension, hyperlipidemia, type 2 diabetes mellitus, obesity, and tobacco use.   Today: Reviewed recent hospitalization. She presented with STEMI 04/28/20 after sudden onset of chest pain. Cath showed thrombotic subtotal occlusion within prior proximal LAD stent with apical distal embolization. Recommended for indefinite DAPT, smoking cessation, and risk factor control. Echo showed low normal EF with apical akinesis.  She is doing well since her STEMI. No further chest pain. We reviewed her hospitalization and cath findings. I agree with the recommendation for lifelong DAPT. Ticagrelor is expensive for her, so would consider changing to clopidogrel long term rather than dropping to aspirin alone if cost becomes limiting.   She feels well overall. Has mild shortness of breath with significant exertion, but no severe symptoms.   We reviewed at length how smoking can make thrombosis more likely. She is working to quit, down to 2 cigarettes/day.   We also spent time today discussing the recommendations for some of the newer agents in diabetes medications and how they can improve long term outcomes in people with heart disease, see below. She has not had any UTIs in many years. She is more interested in pills than injections.   Denies chest pain, shortness of breath at rest. No PND, orthopnea, LE edema or unexpected weight gain. No syncope or palpitations.  Past Medical History:  Diagnosis Date  . Acute ST elevation myocardial infarction (STEMI) of anterior  wall ([place]) 04/28/2020  . Aortic atherosclerosis ([place]) 10/05/2017  . Arthritis    "spine, knees" (10/06/2017)  . CAD (coronary artery disease), native coronary artery 10/05/2017   10/06/2016 cardiac catheterization showing 99% LAD stenosis, 20 to 30% RCA and circumflex stenosis, 3.5 x 15 mm Sierra stent in proximal LAD by Dr. Angelena Form  . Cancer ([place])    SKIN CANCDER REMOVED FROM NOSE   . Chronic lower back pain   . Diabetes mellitus type 2, noninsulin dependent ([place]) 10/05/2017  . GERD (gastroesophageal reflux disease)   . Hidradenitis   . Hypercholesteremia   . Hyperlipidemia 10/05/2017  . Hypertension   . Obesity (BMI 30-39.9) 10/05/2017  . Osteoarthritis (arthritis due to wear and tear of joints)    right knee  . Sleep apnea    STOP BANG SCORE 5  . Tobacco use 09/05/2011  . Type II diabetes mellitus ([place])    Past Surgical History:  Procedure Laterality Date  . CORONARY ANGIOPLASTY WITH STENT PLACEMENT  10/06/2017  . CORONARY STENT INTERVENTION N/A 10/06/2017   Procedure: CORONARY STENT INTERVENTION;  Surgeon: Burnell Blanks, MD;  Location: [facility];  Service: Cardiovascular;  Laterality: N/A;  . CORONARY/GRAFT ACUTE MI REVASCULARIZATION N/A 04/28/2020   Procedure: Coronary/Graft Acute MI Revascularization;  Surgeon: Troy Sine, MD;  Location: [facility];  Service: Cardiovascular;  Laterality: N/A;  . HERNIA REPAIR    . LEFT HEART CATH AND CORONARY ANGIOGRAPHY N/A 10/06/2017   Procedure: LEFT HEART CATH AND CORONARY ANGIOGRAPHY;  Surgeon: Burnell Blanks, MD;  Location: [facility];  Service: Cardiovascular;  Laterality: N/A;  . LEFT HEART CATH AND CORONARY ANGIOGRAPHY N/A 04/28/2020   Procedure: LEFT HEART CATH AND CORONARY ANGIOGRAPHY;  Surgeon: Troy Sine, MD;  Location: [facility];  Service: Cardiovascular;  Laterality: N/A;  . MULTIPLE TOOTH EXTRACTIONS  2017  . TONSILLECTOMY  1963  . VENTRAL HERNIA REPAIR  09/04/2011    Procedure: LAPAROSCOPIC VENTRAL HERNIA;  Surgeon: Gayland Curry, MD,FACS;  Location: [facility];  Service: General;  Laterality: N/A;  LAPAROSCOPIC incarcerated VENTRAL HERNIA  repair      Current Meds  Medication Sig  . acetaminophen (TYLENOL) 500 MG tablet Take 1,000 mg by mouth every 8 (eight) hours as needed (pain).   Marland Kitchen aspirin EC 81 MG EC tablet Take 1 tablet (81 mg total) by mouth daily.  Marland Kitchen atorvastatin (LIPITOR) 80 MG tablet Take 1 tablet (80 mg total) by mouth daily.  . DULoxetine (CYMBALTA) 30 MG capsule Take 60 mg by mouth daily.  Marland Kitchen ezetimibe (ZETIA) 10 MG tablet Take 1 tablet (10 mg total) by mouth daily.  Marland Kitchen glipiZIDE (GLUCOTROL XL) 5 MG 24 hr tablet Take 5 mg by mouth daily.  Marland Kitchen lisinopril (ZESTRIL) 10 MG tablet Take 1 tablet (10 mg total) by mouth daily.  . metFORMIN (GLUCOPHAGE) 500 MG tablet Take 1,000 mg by mouth 2 (two) times daily with a meal.  . metoprolol tartrate (LOPRESSOR) 25 MG tablet Take 1 tablet (25 mg total) by mouth 2 (two) times daily.  Derrill Memo ON 06/11/2020] nicotine (NICODERM CQ - DOSED IN MG/24 HOURS) 14 mg/24hr patch Place 1 patch (14 mg total) onto the skin daily.  . nicotine (NICODERM CQ - DOSED IN MG/24 HOURS) 21 mg/24hr patch Place 1 patch (21 mg total) onto the skin daily.  Derrill Memo ON 06/25/2020] nicotine (NICODERM CQ - DOSED IN MG/24 HR) 7 mg/24hr patch Place 1 patch (7 mg total) onto the skin daily.  . nitroGLYCERIN (NITROSTAT) 0.4 MG SL tablet Place 1 tablet (0.4 mg total) under the tongue every 5 (five) minutes x 3 doses as needed for chest pain.  Marland Kitchen omeprazole (PRILOSEC) 40 MG capsule Take 40 mg by mouth daily.  . ticagrelor (BRILINTA) 90 MG TABS tablet Take 1 tablet (90 mg total) by mouth 2 (two) times daily.  . traMADol (ULTRAM) 50 MG tablet Take by mouth every 6 (six) hours as needed.     Allergies:   Patient has no known allergies.   Social History   Tobacco Use  . Smoking status: Current Every Day Smoker    Packs/day: 0.50    Years: 42.00     Pack years: 21.00    Types: Cigarettes  . Smokeless tobacco: Never Used  Vaping Use  . Vaping Use: Former  Substance Use Topics  . Alcohol use: No  . Drug use: No     Family Hx: The patient's family history includes Atrial fibrillation in her sister and sister; CAD (age of onset: 81) in her father; Cancer in her maternal grandfather; Cancer (age of onset: 76) in her sister; Dementia (age of onset: 40) in her mother; Lung cancer (age of onset: 59) in her brother; Premature CHD in her son.  ROS:   Please see the history of present illness.    All other systems reviewed and are negative.   Prior CV studies:   The following studies were reviewed today: Echo 04/29/20 1. Left ventricular ejection fraction, by estimation, is 50 to 55%. The  left ventricle  has low normal function. The left ventricle demonstrates  regional wall motion abnormalities (see scoring diagram/findings for  description). Akinesis of apex. There is  mild left ventricular hypertrophy. Left ventricular diastolic parameters  are indeterminate.  2. Right ventricular systolic function is normal. The right ventricular  size is normal. Tricuspid regurgitation signal is inadequate for assessing  PA pressure.  3. The mitral valve is abnormal. Trivial mitral valve regurgitation. No  evidence of mitral stenosis. Moderate mitral annular calcification.  4. The aortic valve is tricuspid. Aortic valve regurgitation is not  visualized. Mild aortic valve sclerosis is present, with no evidence of  aortic valve stenosis.  5. Aortic dilatation noted. There is mild dilatation of the ascending  aorta, measuring 38 mm.  6. The inferior vena cava is normal in size with greater than 50%  respiratory variability, suggesting right atrial pressure of 3 mmHg.   Cath/PCI 04/28/20  Dist Cx lesion is 30% stenosed.  1st Mrg lesion is 20% stenosed.  Prox RCA lesion is 10% stenosed.  Dist LAD lesion is 100% stenosed.  Post  intervention, there is a 0% residual stenosis.  Prox LAD to Mid LAD lesion is 80% stenosed.  The left ventricular systolic function is normal.  LV end diastolic pressure is normal.  The left ventricular ejection fraction is greater than 65% by visual estimate.   Acute ST segment elevation myocardial infarction secondary to thrombotic subtotal occlusion within the previously placed proximal LAD stent with probable apical distal embolization.  Mild concomitant nonobstructive CAD involving the left circumflex 20% mild narrowings in the OM1 vessel and AV groove and mild irregularity in the mid RCA.  Successful PCI to the thrombotic lesion in the previously placed Xience 3.5 x 15 mm stent treated with pliant balloon dilatation up to 3.5 mm.  Residual stenosis 0 with brisk TIMI-3 flow.  However there is still probable abrupt cut off at the very apex of the LAD due to initial thrombus embolization  Hyperdynamic LV function with EF estimate greater than 65% without focal wall motion abnormality. LVEDP 12 mm.  RECOMMENDATION: DAPT indefinitely in this patient who had previously undergone stenting of her proximal LAD with subsequent Plavix discontinuation.  Continue Aggrastat 18 hours post procedure.  Aggressive lipid-lowering therapy and optimal blood pressure control.  Smoking cessation is essential.   Echo 03/13/19  1. Left ventricular ejection fraction, by visual estimation, is 65 to 70%. The left ventricle has hyperdynamic function. There is no left ventricular hypertrophy.  2. Left ventricular diastolic parameters are consistent with Grade I diastolic dysfunction (impaired relaxation).  3. The left ventricle has no regional wall motion abnormalities.  4. Global right ventricle has normal systolic function.The right ventricular size is normal. No increase in right ventricular wall thickness.  5. Left atrial size was normal.  6. Right atrial size was normal.  7. The mitral valve is normal in  structure. Trivial mitral valve regurgitation. No evidence of mitral stenosis.  8. The tricuspid valve is normal in structure.  9. The aortic valve is normal in structure. Aortic valve regurgitation is not visualized. No evidence of aortic valve sclerosis or stenosis. 10. The pulmonic valve was normal in structure. Pulmonic valve regurgitation is not visualized. 11. Normal pulmonary artery systolic pressure. 12. The inferior vena cava is normal in size with greater than 50% respiratory variability, suggesting right atrial pressure of 3 mmHg.  Labs/Other Tests and Data Reviewed:    EKG:  An ECG dated 05/03/20 was personally reviewed today and demonstrated:  sinus bradycardia at 56 bpm, poor R wave progression  Recent Labs: 04/28/2020: ALT 11 04/30/2020: BUN 11; Creatinine, Ser 0.60; Hemoglobin 12.0; Magnesium 1.7; Platelets 320; Potassium 4.0; Sodium 140   Recent Lipid Panel Lab Results  Component Value Date/Time   CHOL 203 (H) 04/28/2020 07:01 AM   CHOL 166 09/03/2018 12:03 PM   TRIG 165 (H) 04/28/2020 07:01 AM   HDL 52 04/28/2020 07:01 AM   HDL 51 09/03/2018 12:03 PM   CHOLHDL 3.9 04/28/2020 07:01 AM   LDLCALC 118 (H) 04/28/2020 07:01 AM   LDLCALC 64 09/03/2018 12:03 PM    Wt Readings from Last 3 Encounters:  05/03/20 205 lb 12.8 oz (93.4 kg)  04/28/20 210 lb (95.3 kg)  11/29/19 204 lb (92.5 kg)     Objective:    Vital Signs:  BP (!) 150/80   Pulse (!) 56   Ht 5\' 5"  (1.651 m)   Wt 205 lb 12.8 oz (93.4 kg)   SpO2 96%   BMI 34.25 kg/m    GEN: Well nourished, well developed in no acute distress HEENT: Normal, moist mucous membranes NECK: No JVD CARDIAC: regular rhythm, normal S1 and S2, no rubs or gallops. No murmur. VASCULAR: Radial and DP pulses 2+ bilaterally. No carotid bruits. Her right radial tegaderm was still in place from her cath. There was adhered dried blood. This was removed and the area cleaned. Site is intact without bruising or swelling. RESPIRATORY:  Clear  to auscultation without rales, wheezing or rhonchi  ABDOMEN: Soft, non-tender, non-distended MUSCULOSKELETAL:  Ambulates independently SKIN: Warm and dry, no edema. Significant resolving bruising over upper extremities NEUROLOGIC:  Alert and oriented x 3. No focal neuro deficits noted. PSYCHIATRIC:  Normal affect   ASSESSMENT & PLAN:    Hospital discharge follow-up  Coronary artery disease involving native coronary artery of native heart without angina pectoris - Plan: EKG 12-Lead  Essential hypertension  Tobacco use  Tobacco abuse counseling  Type 2 diabetes mellitus with obesity (HCC)  Mixed hyperlipidemia  Obesity (BMI 30-39.9)   CAD with prior PCI for NSTEMI and now recent STEMI, aortic atherosclerosis: -reviewed recent hospitalization, cath -continue aspirin 81 mg and ticagrelor 90 mg BID. Given in stent thrombosis more than 1 year since prior PCI, would continue DAPT indefinitely. If cost is an issue, may need to change to clopidogrel in the future -continue atorvastatin 40 mg -counseled on red flag warning signs that need immediate medical attention  Tobacco abuse and counseling: The patient was counseled on tobacco cessation today for 7 minutes.  Counseling included reviewing the risks of smoking tobacco products, how it impacts the patient's current medical diagnoses and different strategies for quitting.  Pharmacotherapy to aid in tobacco cessation was not prescribed today.  HLD: goal LDL <70 -Taking both atorvastatin and ezetimibe -LDL last 118 in the hospital. Had been 64 prior. Atorvastatin was increased to 80 mg during her hospitalization. Recheck in 2-3 months.  Hypertension: elevated today but recently low in the hospital -continue metoprolol 25 mg BID given recent STEMI -continue lisinopril 10 mg daily  Type II diabetes, with CAD history -most recent A1c 6.8 on metformin and glipizide -discussed both SGLT2 inhibitors and GLP1-RA today. Reviewed CV risk  reduction as a benefit to medications, as well as weight loss with both. Reviewed contraindications and side effects to monitor for. We specifically discussed risk of urinary tract/genital infections with SGLT2 inhibitors and risk of nausea/pancreatitis with GLP1-RA. -her biggest concern is cost. She would also prefer pills  over injections.  -we also discussed the Coordinate-diabetes trial today. She would be interested -we will continue to address at follow up. Would like start Jardiance if she is amenable  Cardiac risk counseling and prevention recommendations: -recommend heart healthy/Mediterranean diet, with whole grains, fruits, vegetable, fish, lean meats, nuts, and olive oil. Limit salt. -recommend moderate walking, 3-5 times/week for 30-50 minutes each session. Aim for at least 150 minutes.week. Goal should be pace of 3 miles/hours, or walking 1.5 miles in 30 minutes -recommend avoidance of tobacco products. Avoid excess alcohol.  Follow up: 2-3 weeks, virtual or in person  Total time of encounter: 45 minutes total time of encounter, including 38 minutes spent in face-to-face patient care. This time includes coordination of care and counseling regarding recent hospitalization, results, guideline recommended treatment. Remainder of non-face-to-face time involved reviewing chart documents/testing relevant to the patient encounter and documentation in the medical record.  Buford Dresser, MD, PhD, [facility]  [address] [place], [place] [address] [phone]  Patient Instructions  Medication Instructions:  Your Physician recommend you continue on your current medication as directed.    *If you need a refill on your cardiac medications before your next appointment, please call your pharmacy*   Lab Work: None   Testing/Procedures: None   Follow-Up: At [facility], you and your health needs are our priority.  As part of our  continuing mission to provide you with exceptional heart care, we have created designated Provider Care Teams.  These Care Teams include your primary Cardiologist (physician) and Advanced Practice Providers (APPs -  Physician Assistants and Nurse Practitioners) who all work together to provide you with the care you need, when you need it.  We recommend signing up for the patient portal called "MyChart".  Sign up information is provided on this After Visit Summary.  MyChart is used to connect with patients for Virtual Visits (Telemedicine).  Patients are able to view lab/test results, encounter notes, upcoming appointments, etc.  Non-urgent messages can be sent to your provider as well.   To learn more about what you can do with MyChart, go to [email or web address].    Your next appointment:   2-3 week(s)  The format for your next appointment:   Virtual Visit   Provider:   Buford Dresser, MD      Signed, Buford Dresser, MD  05/03/2020 2:34 PM    [place]

## 2020-05-03 NOTE — Patient Instructions (Signed)
Medication Instructions:  Your Physician recommend you continue on your current medication as directed.    *If you need a refill on your cardiac medications before your next appointment, please call your pharmacy*   Lab Work: None   Testing/Procedures: None   Follow-Up: At [facility], you and your health needs are our priority.  As part of our continuing mission to provide you with exceptional heart care, we have created designated Provider Care Teams.  These Care Teams include your primary Cardiologist (physician) and Advanced Practice Providers (APPs -  Physician Assistants and Nurse Practitioners) who all work together to provide you with the care you need, when you need it.  We recommend signing up for the patient portal called "MyChart".  Sign up information is provided on this After Visit Summary.  MyChart is used to connect with patients for Virtual Visits (Telemedicine).  Patients are able to view lab/test results, encounter notes, upcoming appointments, etc.  Non-urgent messages can be sent to your provider as well.   To learn more about what you can do with MyChart, go to [email or web address].    Your next appointment:   2-3 week(s)  The format for your next appointment:   Virtual Visit   Provider:   Buford Dresser, MD

## 2020-05-11 ENCOUNTER — Other Ambulatory Visit: Payer: Self-pay | Admitting: Cardiology

## 2020-05-16 DIAGNOSIS — Z006 Encounter for examination for normal comparison and control in clinical research program: Secondary | ICD-10-CM

## 2020-05-17 ENCOUNTER — Encounter: Payer: Self-pay | Admitting: Cardiology

## 2020-05-17 ENCOUNTER — Telehealth (INDEPENDENT_AMBULATORY_CARE_PROVIDER_SITE_OTHER): Payer: Medicare Other | Admitting: Cardiology

## 2020-05-17 DIAGNOSIS — I2510 Atherosclerotic heart disease of native coronary artery without angina pectoris: Secondary | ICD-10-CM | POA: Diagnosis not present

## 2020-05-17 DIAGNOSIS — E782 Mixed hyperlipidemia: Secondary | ICD-10-CM

## 2020-05-17 DIAGNOSIS — E1169 Type 2 diabetes mellitus with other specified complication: Secondary | ICD-10-CM

## 2020-05-17 DIAGNOSIS — E669 Obesity, unspecified: Secondary | ICD-10-CM

## 2020-05-17 DIAGNOSIS — Z716 Tobacco abuse counseling: Secondary | ICD-10-CM

## 2020-05-17 DIAGNOSIS — Z006 Encounter for examination for normal comparison and control in clinical research program: Secondary | ICD-10-CM

## 2020-05-17 DIAGNOSIS — I10 Essential (primary) hypertension: Secondary | ICD-10-CM

## 2020-05-17 MED ORDER — BLOOD GLUCOSE MONITOR KIT: PACK | 0 refills | Status: AC

## 2020-05-17 MED ORDER — CANAGLIFLOZIN 300 MG PO TABS: 300.0000 mg | ORAL_TABLET | Freq: Every day | ORAL | 11 refills | Status: DC

## 2020-05-17 NOTE — Research (Signed)
COORDINATE DM Informed Consent   Subject Name: Kathryn Bruce  Subject met inclusion and exclusion criteria.  The informed consent form, study requirements and expectations were reviewed with the subject and questions and concerns were addressed prior to the signing of the consent form.  The subject verbalized understanding of the trial requirements.  The subject agreed to participate in the COORDINATE DM trial and signed the informed consent at 1553 on 05/16/2020.  The informed consent was obtained prior to performance of any protocol-specific procedures for the subject.  A copy of the signed informed consent was given to the subject and a copy was placed in the subject's medical record.   Sherlyn Lees

## 2020-05-17 NOTE — Patient Instructions (Addendum)
Medication Instructions:  Stop glipizide to avoid hypoglycemia Continue Metformin 1,000 mg twice a day If you can tolerate, start Invokana 300 mg daily before breakfast  -Willl recheck A1c at follow up.  -We sent in a script for a glucometer  *If you need a refill on your cardiac medications before your next appointment, please call your pharmacy*   Lab Work: None   Testing/Procedures: None   Follow-Up: At [facility], you and your health needs are our priority.  As part of our continuing mission to provide you with exceptional heart care, we have created designated Provider Care Teams.  These Care Teams include your primary Cardiologist (physician) and Advanced Practice Providers (APPs -  Physician Assistants and Nurse Practitioners) who all work together to provide you with the care you need, when you need it.  We recommend signing up for the patient portal called "MyChart".  Sign up information is provided on this After Visit Summary.  MyChart is used to connect with patients for Virtual Visits (Telemedicine).  Patients are able to view lab/test results, encounter notes, upcoming appointments, etc.  Non-urgent messages can be sent to your provider as well.   To learn more about what you can do with MyChart, go to [email or web address].    Your next appointment:   2 month(s)  The format for your next appointment:   In Person  Provider:   Buford Dresser, MD    -if you tolerates Invokana, we will stop glipizide to avoid hypoglycemia -Continue metformin -we will recheck A1c at follow up.  -send in a script for a glucometer

## 2020-05-17 NOTE — Research (Signed)
COORDINATE-Diabetes BASELINE CASE REPORT FORM (Intervention) Site #:  [id number]              Patient ID:  [id number]   INCLUSION CRITERIA  1. Is patient 18 years or older? '[]' No   '[x]' Yes  2. Based on the clinical record, does the patient have a history of type 2 diabetes mellitus? '[]' No   '[x]' Yes  3. Based on the clinical record, does the patient have a history of atherosclerotic cardiovascular disease, as defined by at least one of the clinical criteria below? '[]' No   '[x]' Yes   IF YES: Coronary Artery Disease Prior myocardial infarction Prior coronary artery bypass graft surgery Prior percutaneous coronary intervention Documented obstructive (i.e. ?50%) coronary artery disease (by angiography or CT) '[]' No  '[x]' Yes '[x]' No  '[]' Yes '[]' No  '[x]' Yes '[]' No  '[x]' Yes IF YES: date of MOST RECENT event: 04/28/2020 mm dd yyyy   Cerebrovascular Disease Prior ischemic stroke Carotid artery stenosis (?50%) '[x]' No '[]'  Yes '[x]' No '[]'  Yes IF YES: date of MOST RECENT event:          / /          mm dd yyyy   Peripheral Arterial Disease Prior peripheral revascularization Prior amputation due to poor circulation History of Claudication with Ankle-brachial index <0.9 '[x]' No '[]'  Yes '[x]' No '[]'  Yes '[x]' No '[]'  Yes IF YES: date of MOST RECENT event:          / /           mm dd yyyy  4. Patient's baseline guideline-based therapy score:    1 point Currently prescribed angiotensin converting enzyme inhibitor (ACEi), angiotensin receptor blocker (ARB) or Angiotensin Receptor Neprilysin inhibitor (ARNi) therapy '[]' No '[x]'  Yes   1 point Currently prescribed high intensity statin therapy (atorvastatin 40-80mg daily OR rosuvastatin 20-40mg daily) '[]' No '[x]'  Yes   1 point Most recent HbA1c <7% on metformin monotherapy? '[x]' No '[]'  Yes    Calculated score:    EXCLUSION CRITERIA  REVIEW EACH CONDITION AND SELECT YES IF THE STATEMENT IS TRUE OR NO IF THE STATEMENT IS FALSE.  1. Determined to be highly unlikely to survive and/or to continue follow-up  in that clinic for at least 1 year, as identified by site investigator '[x]' No '[]' Yes  2. eGFR ? 30 ml/min/1.67m '[x]' No '[]' Yes  3. Baseline composite score of 3 for guideline recommended therapy '[x]' No '[]' Yes  4. Absolute contraindication to any of the 3 guideline recommended therapies    ACEi AND ARB therapy '[x]' No '[]'  Yes    High intensity statin therapy '[x]' No  '[]' Yes    SGLT2I AND GLP1RA therapy '[x]' No  '[]' Yes   5. Already taking SGLT2i or GLP1RA therapy at baseline '[x]' No '[]' Yes     Subject Name: Kathryn Bruce Subject met inclusion and exclusion criteria.  The informed consent form, study requirements and expectations were reviewed with the subject and questions and concerns were addressed prior to the signing of the consent form.  The subject verbalized understanding of the trial requirements.  The subject agreed to participate in the [place] and signed the informed consent on 05/16/2020 at 1553.The informed consent was obtained prior to performance of any protocol-specific procedures for the subject.  A copy of the signed informed consent was given to the subject and a copy was placed in the subject's medical record.   [place]  Other Cardiovascular  Atrial Fibrillation or Atrial Flutter '[x]' No '[]' Yes  Hypertension '[]' No '[x]' Yes  Hyperlipidemia '[]' No '[x]' Yes  Cigarette  smoking '[x]' Current '[]' Former '[]' Never  Non-Cardiovascular  Anemia (Hgb<lower limit normal) '[x]' No '[]' Yes  Asthma '[x]' No '[]' Yes  Chronic lung disease '[x]' No '[]' Yes  Cancer - leukemia, lymphoma, or localized solid tumor '[x]' No '[]' Yes  Cancer - metastatic solid tumor '[x]' No '[]' Yes  Mild Liver Disease (Cirrhosis without portal hypertension) '[x]' No '[]' Yes  Moderate/Severe Liver Disease (Cirrhosis with portal hypertension) '[x]' No '[]' Yes  Connective Tissue Disease '[x]' No '[]' Yes  Dementia '[x]' No '[]' Yes  Depression '[x]' No '[]' Yes  Hemiplegia or paraplegia '[x]' No '[]' Yes  Human immunodeficiency virus '[x]' No '[]' Yes   Obstructive Sleep Apnea '[]' No '[x]' Yes  Peptic Ulcer Disease '[x]' No '[]' Yes  Renal insufficiency '[x]' No '[]' Yes  Drug or Alcohol Abuse '[]' Current '[]' Former '[x]' Never  Diabetes History  Year of diagnosis of type 2 diabetes 2019  History of diabetic ketoacidosis? '[x]' No '[]' Yes   Are any of the following complications of diabetes present (Select all that apply): '[]' Retinopathy '[]' Neuropathy '[]' Foot complications      (including ulcers, calluses,         amputation) '[]' Gastroparesis '[x]' None of the above        '[]' No '[x]' Yes  Subject educated on Coordinate diabetes and all educational materials given to subject.

## 2020-05-17 NOTE — Progress Notes (Signed)
Virtual Visit via Telephone Note   This visit type was conducted due to national recommendations for restrictions regarding the COVID-19 Pandemic (e.g. social distancing) in an effort to limit this patient's exposure and mitigate transmission in our community.  Due to her co-morbid illnesses, this patient is at least at moderate risk for complications without adequate follow up.  This format is felt to be most appropriate for this patient at this time.  The patient did not have access to video technology/had technical difficulties with video requiring transitioning to audio format only (telephone).  All issues noted in this document were discussed and addressed.  No physical exam could be performed with this format.  Please refer to the patient's chart for her  consent to telehealth for [facility].   The patient was identified using 2 identifiers.  Patient Location: Home Provider Location: Office/Clinic Cardiology Office Note:    Date:  05/17/2020   ID:  Kathryn Bruce, DOB 10-03-1945, MRN [id number]  PCP:  Maurice Small, MD  Cardiologist:  Buford Dresser, MD  Referring MD: Maurice Small, MD   Chief Complaint:  Follow up  History of Present Illness:    Kathryn Bruce is a 75 y.o. female with a history of prior COVID infection, CAD with NSTEMI in 09/2017 s/p DES to proximal LAD followed by STEMI 04/2020 due to in stent thrombosis, hypertension, hyperlipidemia, type 2 diabetes mellitus, obesity, and tobacco use.   Today: Noted recently that ticagrelor is expensive for her. Her out of pocket is about $75. She is also concerned that SLGT2i will be expensive for her as well. [name] and Wilder Glade are not formulary for her, but Invokana (canagliflozin) is based on her insurance information. I will ask our PharmD team to see if they can assist [place] with cost for her. She also enrolled in the Coordinate-diabetes study since our last visit.   Tolerating medications, no side effects that she  is aware of. Overall feeling low energy, but tries to stay active.   We again discussed tobacco cessation, smoking 3-4 cigarettes/day, down from 1 ppd. Going to pick up the patch from the pharmacy today.  We discussed diabetes medications today. Last A1c was 6.8, blood glucose was generally well controlled. Given this, if she tolerates Invokana, we will stop glipizide to avoid hypoglycemia, continue metformin, and recheck A1c at follow up. She does not have a glucometer, I have attempted to send in a script for this.  Denies chest pain. No PND, orthopnea, LE edema or unexpected weight gain. No syncope or palpitations. Has occasional shortness of breath, more with exertion than at rest, lasts a few seconds, better with rest.  Cardiac rehab is also very expensive for her, she is trying to increase activity on her own.  Past Medical History:  Diagnosis Date  . Acute ST elevation myocardial infarction (STEMI) of anterior wall ([place]) 04/28/2020  . Aortic atherosclerosis ([place]) 10/05/2017  . Arthritis    "spine, knees" (10/06/2017)  . CAD (coronary artery disease), native coronary artery 10/05/2017   10/06/2016 cardiac catheterization showing 99% LAD stenosis, 20 to 30% RCA and circumflex stenosis, 3.5 x 15 mm [place] stent in proximal LAD by Dr. Angelena Form  . Cancer ([place])    SKIN CANCDER REMOVED FROM NOSE   . Chronic lower back pain   . Diabetes mellitus type 2, noninsulin dependent ([place]) 10/05/2017  . GERD (gastroesophageal reflux disease)   . Hidradenitis   . Hypercholesteremia   . Hyperlipidemia 10/05/2017  . Hypertension   .  Obesity (BMI 30-39.9) 10/05/2017  . Osteoarthritis (arthritis due to wear and tear of joints)    right knee  . Sleep apnea    STOP BANG SCORE 5  . Tobacco use 09/05/2011  . Type II diabetes mellitus ([place])    Past Surgical History:  Procedure Laterality Date  . CORONARY ANGIOPLASTY WITH STENT PLACEMENT  10/06/2017  . CORONARY STENT INTERVENTION N/A 10/06/2017   Procedure:  CORONARY STENT INTERVENTION;  Surgeon: Burnell Blanks, MD;  Location: [place] [facility];  Service: Cardiovascular;  Laterality: N/A;  . CORONARY/GRAFT ACUTE MI REVASCULARIZATION N/A 04/28/2020   Procedure: Coronary/Graft Acute MI Revascularization;  Surgeon: Troy Sine, MD;  Location: [place] [facility];  Service: Cardiovascular;  Laterality: N/A;  . HERNIA REPAIR    . LEFT HEART CATH AND CORONARY ANGIOGRAPHY N/A 10/06/2017   Procedure: LEFT HEART CATH AND CORONARY ANGIOGRAPHY;  Surgeon: Burnell Blanks, MD;  Location: [place] [facility];  Service: Cardiovascular;  Laterality: N/A;  . LEFT HEART CATH AND CORONARY ANGIOGRAPHY N/A 04/28/2020   Procedure: LEFT HEART CATH AND CORONARY ANGIOGRAPHY;  Surgeon: Troy Sine, MD;  Location: [place] [facility];  Service: Cardiovascular;  Laterality: N/A;  . MULTIPLE TOOTH EXTRACTIONS  2017  . TONSILLECTOMY  1963  . VENTRAL HERNIA REPAIR  09/04/2011   Procedure: LAPAROSCOPIC VENTRAL HERNIA;  Surgeon: Gayland Curry, MD,FACS;  Location: [facility];  Service: General;  Laterality: N/A;  LAPAROSCOPIC incarcerated VENTRAL HERNIA  repair      Current Meds  Medication Sig  . acetaminophen (TYLENOL) 500 MG tablet Take 1,000 mg by mouth every 8 (eight) hours as needed (pain).   Marland Kitchen aspirin EC 81 MG EC tablet Take 1 tablet (81 mg total) by mouth daily.  Marland Kitchen atorvastatin (LIPITOR) 80 MG tablet Take 1 tablet (80 mg total) by mouth daily.  . blood glucose meter kit and supplies KIT Dispense based on patient and insurance preference. Use up to four times daily as directed.  . canagliflozin (INVOKANA) 300 MG TABS tablet Take 1 tablet (300 mg total) by mouth daily before breakfast.  . DULoxetine (CYMBALTA) 30 MG capsule Take 60 mg by mouth daily.  Marland Kitchen ezetimibe (ZETIA) 10 MG tablet TAKE 1 TABLET(10 MG) BY MOUTH DAILY  . lisinopril (ZESTRIL) 10 MG tablet Take 1 tablet (10 mg total) by mouth daily.  . metFORMIN (GLUCOPHAGE) 500 MG tablet Take 1,000 mg by  mouth 2 (two) times daily with a meal.  . metoprolol tartrate (LOPRESSOR) 25 MG tablet Take 1 tablet (25 mg total) by mouth 2 (two) times daily.  Derrill Memo ON 06/11/2020] nicotine (NICODERM CQ - DOSED IN MG/24 HOURS) 14 mg/24hr patch Place 1 patch (14 mg total) onto the skin daily.  . nicotine (NICODERM CQ - DOSED IN MG/24 HOURS) 21 mg/24hr patch Place 1 patch (21 mg total) onto the skin daily.  Derrill Memo ON 06/25/2020] nicotine (NICODERM CQ - DOSED IN MG/24 HR) 7 mg/24hr patch Place 1 patch (7 mg total) onto the skin daily.  . nitroGLYCERIN (NITROSTAT) 0.4 MG SL tablet Place 1 tablet (0.4 mg total) under the tongue every 5 (five) minutes x 3 doses as needed for chest pain.  Marland Kitchen omeprazole (PRILOSEC) 40 MG capsule Take 40 mg by mouth daily.  . ticagrelor (BRILINTA) 90 MG TABS tablet Take 1 tablet (90 mg total) by mouth 2 (two) times daily.  . traMADol (ULTRAM) 50 MG tablet Take by mouth every 6 (six) hours as needed.  . [DISCONTINUED]  glipiZIDE (GLUCOTROL XL) 5 MG 24 hr tablet Take 5 mg by mouth daily.     Allergies:   Patient has no known allergies.   Social History   Tobacco Use  . Smoking status: Current Every Day Smoker    Packs/day: 0.50    Years: 42.00    Pack years: 21.00    Types: Cigarettes  . Smokeless tobacco: Never Used  Vaping Use  . Vaping Use: Former  Substance Use Topics  . Alcohol use: No  . Drug use: No     Family Hx: The patient's family history includes Atrial fibrillation in her sister and sister; CAD (age of onset: 75) in her father; Cancer in her maternal grandfather; Cancer (age of onset: 74) in her sister; Dementia (age of onset: 42) in her mother; Lung cancer (age of onset: 49) in her brother; Premature CHD in her son.  ROS:   Please see the history of present illness.    All other systems reviewed and are negative.   Prior CV studies:   The following studies were reviewed today: Echo 04/29/20 1. Left ventricular ejection fraction, by estimation, is 50 to  55%. The  left ventricle has low normal function. The left ventricle demonstrates  regional wall motion abnormalities (see scoring diagram/findings for  description). Akinesis of apex. There is  mild left ventricular hypertrophy. Left ventricular diastolic parameters  are indeterminate.  2. Right ventricular systolic function is normal. The right ventricular  size is normal. Tricuspid regurgitation signal is inadequate for assessing  PA pressure.  3. The mitral valve is abnormal. Trivial mitral valve regurgitation. No  evidence of mitral stenosis. Moderate mitral annular calcification.  4. The aortic valve is tricuspid. Aortic valve regurgitation is not  visualized. Mild aortic valve sclerosis is present, with no evidence of  aortic valve stenosis.  5. Aortic dilatation noted. There is mild dilatation of the ascending  aorta, measuring 38 mm.  6. The inferior vena cava is normal in size with greater than 50%  respiratory variability, suggesting right atrial pressure of 3 mmHg.   Cath/PCI 04/28/20  Dist Cx lesion is 30% stenosed.  1st Mrg lesion is 20% stenosed.  Prox RCA lesion is 10% stenosed.  Dist LAD lesion is 100% stenosed.  Post intervention, there is a 0% residual stenosis.  Prox LAD to Mid LAD lesion is 80% stenosed.  The left ventricular systolic function is normal.  LV end diastolic pressure is normal.  The left ventricular ejection fraction is greater than 65% by visual estimate.   Acute ST segment elevation myocardial infarction secondary to thrombotic subtotal occlusion within the previously placed proximal LAD stent with probable apical distal embolization.  Mild concomitant nonobstructive CAD involving the left circumflex 20% mild narrowings in the OM1 vessel and AV groove and mild irregularity in the mid RCA.  Successful PCI to the thrombotic lesion in the previously placed Xience 3.5 x 15 mm stent treated with pliant balloon dilatation up to 3.5 mm.   Residual stenosis 0 with brisk TIMI-3 flow.  However there is still probable abrupt cut off at the very apex of the LAD due to initial thrombus embolization  Hyperdynamic LV function with EF estimate greater than 65% without focal wall motion abnormality. LVEDP 12 mm.  RECOMMENDATION: DAPT indefinitely in this patient who had previously undergone stenting of her proximal LAD with subsequent Plavix discontinuation.  Continue Aggrastat 18 hours post procedure.  Aggressive lipid-lowering therapy and optimal blood pressure control.  Smoking cessation is essential.  Echo 03/13/19  1. Left ventricular ejection fraction, by visual estimation, is 65 to 70%. The left ventricle has hyperdynamic function. There is no left ventricular hypertrophy.  2. Left ventricular diastolic parameters are consistent with Grade I diastolic dysfunction (impaired relaxation).  3. The left ventricle has no regional wall motion abnormalities.  4. Global right ventricle has normal systolic function.The right ventricular size is normal. No increase in right ventricular wall thickness.  5. Left atrial size was normal.  6. Right atrial size was normal.  7. The mitral valve is normal in structure. Trivial mitral valve regurgitation. No evidence of mitral stenosis.  8. The tricuspid valve is normal in structure.  9. The aortic valve is normal in structure. Aortic valve regurgitation is not visualized. No evidence of aortic valve sclerosis or stenosis. 10. The pulmonic valve was normal in structure. Pulmonic valve regurgitation is not visualized. 11. Normal pulmonary artery systolic pressure. 12. The inferior vena cava is normal in size with greater than 50% respiratory variability, suggesting right atrial pressure of 3 mmHg.  Labs/Other Tests and Data Reviewed:    EKG:  An ECG dated 05/03/20 was personally reviewed today and demonstrated:  sinus bradycardia at 56 bpm, poor R wave progression  Recent Labs: 04/28/2020: ALT  11 04/30/2020: BUN 11; Creatinine, Ser 0.60; Hemoglobin 12.0; Magnesium 1.7; Platelets 320; Potassium 4.0; Sodium 140   Recent Lipid Panel Lab Results  Component Value Date/Time   CHOL 203 (H) 04/28/2020 07:01 AM   CHOL 166 09/03/2018 12:03 PM   TRIG 165 (H) 04/28/2020 07:01 AM   HDL 52 04/28/2020 07:01 AM   HDL 51 09/03/2018 12:03 PM   CHOLHDL 3.9 04/28/2020 07:01 AM   LDLCALC 118 (H) 04/28/2020 07:01 AM   LDLCALC 64 09/03/2018 12:03 PM    Wt Readings from Last 3 Encounters:  05/03/20 205 lb 12.8 oz (93.4 kg)  04/28/20 210 lb (95.3 kg)  11/29/19 204 lb (92.5 kg)     Objective:    Vital Signs:  There were no vitals taken for this visit.   Speaking comfortably on the phone, no audible wheezing In no acute distress Alert and oriented Normal affect Normal speech  ASSESSMENT & PLAN:    Coronary artery disease involving native coronary artery of native heart without angina pectoris - Plan: canagliflozin (INVOKANA) 300 MG TABS tablet  Essential hypertension  Type 2 diabetes mellitus with obesity ([place]) - Plan: canagliflozin (INVOKANA) 300 MG TABS tablet, blood glucose meter kit and supplies KIT  Mixed hyperlipidemia  Tobacco abuse counseling  CAD with prior PCI for NSTEMI and now recent STEMI, aortic atherosclerosis: -reviewed recent hospitalization, cath -continue aspirin 81 mg and ticagrelor 90 mg BID. Given in stent thrombosis more than 1 year since prior PCI, would continue DAPT indefinitely. If cost is an issue, may need to change to clopidogrel in the future. We will see if we can assist with ticagrelor cost. -continue atorvastatin 40 mg -counseled on red flag warning signs that need immediate medical attention -cardiac rehab is expensive for her, working on doing activity on her own  Tobacco abuse and counseling: The patient was counseled on tobacco cessation today for 3 minutes.  Counseling included reviewing the risks of smoking tobacco products, how it impacts  the patient's current medical diagnoses and different strategies for quitting.  Pharmacotherapy to aid in tobacco cessation was not prescribed today--already prescribed.  HLD: goal LDL <70 -Taking both atorvastatin and ezetimibe -LDL last 118 in the hospital. Had been 64 prior. Atorvastatin  was increased to 80 mg during her hospitalization. Recheck at follow up  Hypertension: no vitals available today -continue metoprolol 25 mg BID given recent STEMI -continue lisinopril 10 mg daily  Type II diabetes, with CAD history -most recent A1c 6.8 on metformin and glipizide -see prior note for discussion of medications. I prefer Jardiance or Wilder Glade, but Anastasio Auerbach is what is preferred based on her insurance information. -she is enrolled in the Coordinate-diabetes trial -if she tolerates Invokana, we will stop glipizide to avoid hypoglycemia, continue metformin, and recheck A1c at follow up. She does not have a glucometer, I have attempted to send in a script for this.  Cardiac risk counseling and prevention recommendations: -recommend heart healthy/Mediterranean diet, with whole grains, fruits, vegetable, fish, lean meats, nuts, and olive oil. Limit salt. -recommend moderate walking, 3-5 times/week for 30-50 minutes each session. Aim for at least 150 minutes.week. Goal should be pace of 3 miles/hours, or walking 1.5 miles in 30 minutes -recommend avoidance of tobacco products. Avoid excess alcohol.  Follow up: 2 mos, check lipids, A1c, BMET at that time  Today, I have spent 24 minutes with the patient with telehealth technology discussing the above problems.  Additional time spent in chart review, documentation, and communication.  Buford Dresser, MD, PhD, [place]  [address], [place], [place] [address] [phone]  Meds ordered this encounter  Medications  . canagliflozin (INVOKANA) 300 MG TABS tablet    Sig: Take 1 tablet (300 mg total) by  mouth daily before breakfast.    Dispense:  30 tablet    Refill:  11  . blood glucose meter kit and supplies KIT    Sig: Dispense based on patient and insurance preference. Use up to four times daily as directed.    Dispense:  1 each    Refill:  0    Order Specific Question:   Number of strips    Answer:   43    Order Specific Question:   Number of lancets    Answer:   90   No orders of the defined types were placed in this encounter.  Patient Instructions  Medication Instructions:  Stop glipizide to avoid hypoglycemia Continue Metformin 1,000 mg twice a day If you can tolerate, start Invokana 300 mg daily before breakfast  -Willl recheck A1c at follow up.  -We sent in a script for a glucometer  *If you need a refill on your cardiac medications before your next appointment, please call your pharmacy*   Lab Work: None   Testing/Procedures: None   Follow-Up: At [facility], you and your health needs are our priority.  As part of our continuing mission to provide you with exceptional heart care, we have created designated Provider Care Teams.  These Care Teams include your primary Cardiologist (physician) and Advanced Practice Providers (APPs -  Physician Assistants and Nurse Practitioners) who all work together to provide you with the care you need, when you need it.  We recommend signing up for the patient portal called "MyChart".  Sign up information is provided on this After Visit Summary.  MyChart is used to connect with patients for Virtual Visits (Telemedicine).  Patients are able to view lab/test results, encounter notes, upcoming appointments, etc.  Non-urgent messages can be sent to your provider as well.   To learn more about what you can do with MyChart, go to [email or web address].    Your next appointment:   2 month(s)  The format  for your next appointment:   In Person  Provider:   Buford Dresser, MD    -if you tolerates Invokana, we will stop  glipizide to avoid hypoglycemia -Continue metformin -we will recheck A1c at follow up.  -send in a script for a glucometer    Signed, Buford Dresser, MD  05/17/2020 10:39 AM    [place]

## 2020-05-25 ENCOUNTER — Ambulatory Visit: Payer: Medicare Other | Admitting: Cardiology

## 2020-07-04 ENCOUNTER — Telehealth: Payer: Self-pay | Admitting: Cardiology

## 2020-07-04 NOTE — Telephone Encounter (Signed)
Called patient, advised I did not see any recent calls or results.  Patient verbalized understanding, thankful for call back.  Confirmed upcoming appointment.

## 2020-07-04 NOTE — Telephone Encounter (Signed)
Kathryn Bruce is calling stating she is returning a call from last week. I was unable to find documentation as to what the call was in regards to. [name] states they did not leave a VM because her number does not give that option. States she believes it would have been in regards to scheduling her yearly MRI, but I did not find a active request for this on file. Please advise.

## 2020-07-19 ENCOUNTER — Encounter: Payer: Self-pay | Admitting: Cardiology

## 2020-07-23 ENCOUNTER — Ambulatory Visit: Payer: Medicare Other | Admitting: Cardiology

## 2020-07-23 NOTE — Progress Notes (Incomplete)
Cardiology Office Note:    Date:  07/23/2020   ID:  Kathryn Bruce, DOB 1945-12-24, MRN [id number]  PCP:  Maurice Small, MD  Cardiologist:  Buford Dresser, MD  Referring MD: Maurice Small, MD   Chief Complaint:  Follow up  History of Present Illness:    Kathryn Bruce is a 75 y.o. female with a history of prior COVID infection, CAD with NSTEMI in 09/2017 s/p DES to proximal LAD followed by STEMI 04/2020 due to in stent thrombosis, hypertension, hyperlipidemia, type 2 diabetes mellitus, obesity, and tobacco use.   Prior Visit: Noted recently that ticagrelor is expensive for her. Her out of pocket is about $75. She is also concerned that SLGT2i will be expensive for her as well. [name] and Wilder Glade are not formulary for her, but Invokana (canagliflozin) is based on her insurance information. I will ask our PharmD team to see if they can assist [place] with cost for her. She also enrolled in the Coordinate-diabetes study since our last visit.   Tolerating medications, no side effects that she is aware of. Overall feeling low energy, but tries to stay active.   We again discussed tobacco cessation, smoking 3-4 cigarettes/day, down from 1 ppd. Going to pick up the patch from the pharmacy today.  We discussed diabetes medications today. Last A1c was 6.8, blood glucose was generally well controlled. Given this, if she tolerates Invokana, we will stop glipizide to avoid hypoglycemia, continue metformin, and recheck A1c at follow up. She does not have a glucometer, I have attempted to send in a script for this.  Denies chest pain. No PND, orthopnea, LE edema or unexpected weight gain. No syncope or palpitations. Has occasional shortness of breath, more with exertion than at rest, lasts a few seconds, better with rest.  Cardiac rehab is also very expensive for her, she is trying to increase activity on her own.  Today:  She denies any chest pain, shortness of breath, palpitations, or  exertional symptoms. No headaches, lightheadedness, or syncope to report. Also has no lower extremity edema, orthopnea or PND.   Past Medical History:  Diagnosis Date   Acute ST elevation myocardial infarction (STEMI) of anterior wall ([place]) 04/28/2020   Aortic atherosclerosis ([place]) 10/05/2017   Arthritis    "spine, knees" (10/06/2017)   CAD (coronary artery disease), native coronary artery 10/05/2017   10/06/2016 cardiac catheterization showing 99% LAD stenosis, 20 to 30% RCA and circumflex stenosis, 3.5 x 15 mm [place] stent in proximal LAD by Dr. Angelena Form   Cancer [facility])    SKIN CANCDER REMOVED FROM NOSE    Chronic lower back pain    Diabetes mellitus type 2, noninsulin dependent ([place]) 10/05/2017   GERD (gastroesophageal reflux disease)    Hidradenitis    Hypercholesteremia    Hyperlipidemia 10/05/2017   Hypertension    Obesity (BMI 30-39.9) 10/05/2017   Osteoarthritis (arthritis due to wear and tear of joints)    right knee   Sleep apnea    STOP BANG SCORE 5   Tobacco use 09/05/2011   Type II diabetes mellitus ([place])    Past Surgical History:  Procedure Laterality Date   CORONARY ANGIOPLASTY WITH STENT PLACEMENT  10/06/2017   CORONARY STENT INTERVENTION N/A 10/06/2017   Procedure: CORONARY STENT INTERVENTION;  Surgeon: Burnell Blanks, MD;  Location: [place] [facility];  Service: Cardiovascular;  Laterality: N/A;   CORONARY/GRAFT ACUTE MI REVASCULARIZATION N/A 04/28/2020   Procedure: Coronary/Graft Acute MI Revascularization;  Surgeon: Shelva Majestic  A, MD;  Location: [place] [facility];  Service: Cardiovascular;  Laterality: N/A;   HERNIA REPAIR     LEFT HEART CATH AND CORONARY ANGIOGRAPHY N/A 10/06/2017   Procedure: LEFT HEART CATH AND CORONARY ANGIOGRAPHY;  Surgeon: Burnell Blanks, MD;  Location: [place] [facility];  Service: Cardiovascular;  Laterality: N/A;   LEFT HEART CATH AND CORONARY ANGIOGRAPHY N/A 04/28/2020   Procedure: LEFT HEART CATH AND  CORONARY ANGIOGRAPHY;  Surgeon: Troy Sine, MD;  Location: [place] [facility];  Service: Cardiovascular;  Laterality: N/A;   MULTIPLE TOOTH EXTRACTIONS  2017   TONSILLECTOMY  1963   VENTRAL HERNIA REPAIR  09/04/2011   Procedure: LAPAROSCOPIC VENTRAL HERNIA;  Surgeon: Gayland Curry, MD,FACS;  Location: [facility];  Service: General;  Laterality: N/A;  LAPAROSCOPIC incarcerated VENTRAL HERNIA  repair      No outpatient medications have been marked as taking for the 07/25/20 encounter (Appointment) with Buford Dresser, MD.     Allergies:   Patient has no known allergies.   Social History   Tobacco Use   Smoking status: Current Every Day Smoker    Packs/day: 0.50    Years: 42.00    Pack years: 21.00    Types: Cigarettes   Smokeless tobacco: Never Used  Scientific laboratory technician Use: Former  Substance Use Topics   Alcohol use: No   Drug use: No     Family Hx: The patient's family history includes Atrial fibrillation in her sister and sister; CAD (age of onset: 17) in her father; Cancer in her maternal grandfather; Cancer (age of onset: 25) in her sister; Dementia (age of onset: 51) in her mother; Lung cancer (age of onset: 31) in her brother; Premature CHD in her son.  ROS:   Please see the history of present illness.    (+) All other systems reviewed and are negative.   Prior CV studies:   The following studies were reviewed today:  Echo 04/29/20 1. Left ventricular ejection fraction, by estimation, is 50 to 55%. The  left ventricle has low normal function. The left ventricle demonstrates  regional wall motion abnormalities (see scoring diagram/findings for  description). Akinesis of apex. There is  mild left ventricular hypertrophy. Left ventricular diastolic parameters  are indeterminate.  2. Right ventricular systolic function is normal. The right ventricular  size is normal. Tricuspid regurgitation signal is inadequate for assessing  PA pressure.  3. The  mitral valve is abnormal. Trivial mitral valve regurgitation. No  evidence of mitral stenosis. Moderate mitral annular calcification.  4. The aortic valve is tricuspid. Aortic valve regurgitation is not  visualized. Mild aortic valve sclerosis is present, with no evidence of  aortic valve stenosis.  5. Aortic dilatation noted. There is mild dilatation of the ascending  aorta, measuring 38 mm.  6. The inferior vena cava is normal in size with greater than 50%  respiratory variability, suggesting right atrial pressure of 3 mmHg.   Cath/PCI 04/28/20  Dist Cx lesion is 30% stenosed.  1st Mrg lesion is 20% stenosed.  Prox RCA lesion is 10% stenosed.  Dist LAD lesion is 100% stenosed.  Post intervention, there is a 0% residual stenosis.  Prox LAD to Mid LAD lesion is 80% stenosed.  The left ventricular systolic function is normal.  LV end diastolic pressure is normal.  The left ventricular ejection fraction is greater than 65% by visual estimate.   Acute ST segment elevation myocardial infarction secondary to thrombotic subtotal occlusion  within the previously placed proximal LAD stent with probable apical distal embolization.  Mild concomitant nonobstructive CAD involving the left circumflex 20% mild narrowings in the OM1 vessel and AV groove and mild irregularity in the mid RCA.  Successful PCI to the thrombotic lesion in the previously placed Xience 3.5 x 15 mm stent treated with pliant balloon dilatation up to 3.5 mm.  Residual stenosis 0 with brisk TIMI-3 flow.  However there is still probable abrupt cut off at the very apex of the LAD due to initial thrombus embolization  Hyperdynamic LV function with EF estimate greater than 65% without focal wall motion abnormality. LVEDP 12 mm.  RECOMMENDATION: DAPT indefinitely in this patient who had previously undergone stenting of her proximal LAD with subsequent Plavix discontinuation.  Continue Aggrastat 18 hours post procedure.   Aggressive lipid-lowering therapy and optimal blood pressure control.  Smoking cessation is essential.   Echo 03/13/19  1. Left ventricular ejection fraction, by visual estimation, is 65 to 70%. The left ventricle has hyperdynamic function. There is no left ventricular hypertrophy.  2. Left ventricular diastolic parameters are consistent with Grade I diastolic dysfunction (impaired relaxation).  3. The left ventricle has no regional wall motion abnormalities.  4. Global right ventricle has normal systolic function.The right ventricular size is normal. No increase in right ventricular wall thickness.  5. Left atrial size was normal.  6. Right atrial size was normal.  7. The mitral valve is normal in structure. Trivial mitral valve regurgitation. No evidence of mitral stenosis.  8. The tricuspid valve is normal in structure.  9. The aortic valve is normal in structure. Aortic valve regurgitation is not visualized. No evidence of aortic valve sclerosis or stenosis. 10. The pulmonic valve was normal in structure. Pulmonic valve regurgitation is not visualized. 11. Normal pulmonary artery systolic pressure. 12. The inferior vena cava is normal in size with greater than 50% respiratory variability, suggesting right atrial pressure of 3 mmHg.  Labs/Other Tests and Data Reviewed:    EKG:   07/25/2020: *** 05/03/2020: sinus bradycardia, rate 56 bpm, poor R wave progression.  Recent Labs: 04/28/2020: ALT 11 04/30/2020: BUN 11; Creatinine, Ser 0.60; Hemoglobin 12.0; Magnesium 1.7; Platelets 320; Potassium 4.0; Sodium 140   Recent Lipid Panel Lab Results  Component Value Date/Time   CHOL 203 (H) 04/28/2020 07:01 AM   CHOL 166 09/03/2018 12:03 PM   TRIG 165 (H) 04/28/2020 07:01 AM   HDL 52 04/28/2020 07:01 AM   HDL 51 09/03/2018 12:03 PM   CHOLHDL 3.9 04/28/2020 07:01 AM   LDLCALC 118 (H) 04/28/2020 07:01 AM   LDLCALC 64 09/03/2018 12:03 PM    Wt Readings from Last 3 Encounters:  05/03/20 205  lb 12.8 oz (93.4 kg)  04/28/20 210 lb (95.3 kg)  11/29/19 204 lb (92.5 kg)     Objective:    Vital Signs:  There were no vitals taken for this visit.   GEN: Well nourished, well developed in no acute distress HEENT: Normal, moist mucous membranes NECK: No JVD CARDIAC: regular rhythm, normal S1 and S2, no rubs or gallops. No murmur. VASCULAR: Radial and DP pulses 2+ bilaterally. No carotid bruits RESPIRATORY:  Clear to auscultation without rales, wheezing or rhonchi  ABDOMEN: Soft, non-tender, non-distended MUSCULOSKELETAL:  Ambulates independently SKIN: Warm and dry, no edema NEUROLOGIC:  Alert and oriented x 3. No focal neuro deficits noted. PSYCHIATRIC:  Normal affect    ASSESSMENT & PLAN:    No diagnosis found.  CAD with prior PCI  for NSTEMI and now recent STEMI, aortic atherosclerosis: -reviewed recent hospitalization, cath -continue aspirin 81 mg and ticagrelor 90 mg BID. Given in stent thrombosis more than 1 year since prior PCI, would continue DAPT indefinitely. If cost is an issue, may need to change to clopidogrel in the future. We will see if we can assist with ticagrelor cost. -continue atorvastatin 40 mg -counseled on red flag warning signs that need immediate medical attention -cardiac rehab is expensive for her, working on doing activity on her own  Tobacco abuse and counseling: The patient was counseled on tobacco cessation today for 3 minutes.  Counseling included reviewing the risks of smoking tobacco products, how it impacts the patient's current medical diagnoses and different strategies for quitting.  Pharmacotherapy to aid in tobacco cessation was not prescribed today--already prescribed.  HLD: goal LDL <70 -Taking both atorvastatin and ezetimibe -LDL last 118 in the hospital. Had been 64 prior. Atorvastatin was increased to 80 mg during her hospitalization. Recheck at follow up  Hypertension: no vitals available today -continue metoprolol 25 mg BID  given recent STEMI -continue lisinopril 10 mg daily  Type II diabetes, with CAD history -most recent A1c 6.8 on metformin and glipizide -see prior note for discussion of medications. I prefer Jardiance or Wilder Glade, but Anastasio Auerbach is what is preferred based on her insurance information. -she is enrolled in the Coordinate-diabetes trial -if she tolerates Invokana, we will stop glipizide to avoid hypoglycemia, continue metformin, and recheck A1c at follow up. She does not have a glucometer, I have attempted to send in a script for this.  Cardiac risk counseling and prevention recommendations: -recommend heart healthy/Mediterranean diet, with whole grains, fruits, vegetable, fish, lean meats, nuts, and olive oil. Limit salt. -recommend moderate walking, 3-5 times/week for 30-50 minutes each session. Aim for at least 150 minutes.week. Goal should be pace of 3 miles/hours, or walking 1.5 miles in 30 minutes -recommend avoidance of tobacco products. Avoid excess alcohol.   Follow up: *** months   Buford Dresser, MD, PhD, [place]  [address] [place], [place] [address] [phone]  No orders of the defined types were placed in this encounter.  No orders of the defined types were placed in this encounter.  There are no Patient Instructions on file for this visit.  I,Mathew Stumpf,acting as a Education administrator for [employer], MD.,have documented all relevant documentation on the behalf of Buford Dresser, MD,as directed by  Buford Dresser, MD while in the presence of Buford Dresser, MD.  ***  Signed, Madelin Rear  07/23/2020 9:40 AM    [place]

## 2020-07-25 ENCOUNTER — Ambulatory Visit: Payer: Medicare Other | Admitting: Cardiology

## 2020-07-26 DIAGNOSIS — I10 Essential (primary) hypertension: Secondary | ICD-10-CM | POA: Diagnosis not present

## 2020-07-26 DIAGNOSIS — E1169 Type 2 diabetes mellitus with other specified complication: Secondary | ICD-10-CM | POA: Diagnosis not present

## 2020-07-26 DIAGNOSIS — E785 Hyperlipidemia, unspecified: Secondary | ICD-10-CM | POA: Diagnosis not present

## 2020-07-26 DIAGNOSIS — Z7984 Long term (current) use of oral hypoglycemic drugs: Secondary | ICD-10-CM | POA: Diagnosis not present

## 2020-08-01 DIAGNOSIS — I10 Essential (primary) hypertension: Secondary | ICD-10-CM | POA: Diagnosis not present

## 2020-08-01 DIAGNOSIS — E1142 Type 2 diabetes mellitus with diabetic polyneuropathy: Secondary | ICD-10-CM | POA: Diagnosis not present

## 2020-08-01 DIAGNOSIS — Z0000 Encounter for general adult medical examination without abnormal findings: Secondary | ICD-10-CM | POA: Diagnosis not present

## 2020-08-01 DIAGNOSIS — E785 Hyperlipidemia, unspecified: Secondary | ICD-10-CM | POA: Diagnosis not present

## 2020-08-09 ENCOUNTER — Other Ambulatory Visit: Payer: Self-pay

## 2020-08-09 ENCOUNTER — Ambulatory Visit: Payer: Medicare Other | Admitting: Physician Assistant

## 2020-08-09 ENCOUNTER — Encounter: Payer: Self-pay | Admitting: Physician Assistant

## 2020-08-09 VITALS — BP 122/77 | HR 87 | Ht 66.0 in | Wt 196.6 lb

## 2020-08-09 DIAGNOSIS — E785 Hyperlipidemia, unspecified: Secondary | ICD-10-CM

## 2020-08-09 DIAGNOSIS — I10 Essential (primary) hypertension: Secondary | ICD-10-CM

## 2020-08-09 DIAGNOSIS — E119 Type 2 diabetes mellitus without complications: Secondary | ICD-10-CM | POA: Diagnosis not present

## 2020-08-09 DIAGNOSIS — I2510 Atherosclerotic heart disease of native coronary artery without angina pectoris: Secondary | ICD-10-CM | POA: Diagnosis not present

## 2020-08-09 NOTE — Patient Instructions (Signed)
Medication Instructions:  Your physician recommends that you continue on your current medications as directed. Please refer to the Current Medication list given to you today.  *If you need a refill on your cardiac medications before your next appointment, please call your pharmacy*  Lab Work: NONE ordered at this time of appointment   If you have labs (blood work) drawn today and your tests are completely normal, you will receive your results only by: Marland Kitchen MyChart Message (if you have MyChart) OR . A paper copy in the mail If you have any lab test that is abnormal or we need to change your treatment, we will call you to review the results.  Testing/Procedures: NONE ordered at this time of appointment   Follow-Up: At [facility], you and your health needs are our priority.  As part of our continuing mission to provide you with exceptional heart care, we have created designated Provider Care Teams.  These Care Teams include your primary Cardiologist (physician) and Advanced Practice Providers (APPs -  Physician Assistants and Nurse Practitioners) who all work together to provide you with the care you need, when you need it.  We recommend signing up for the patient portal called "MyChart".  Sign up information is provided on this After Visit Summary.  MyChart is used to connect with patients for Virtual Visits (Telemedicine).  Patients are able to view lab/test results, encounter notes, upcoming appointments, etc.  Non-urgent messages can be sent to your provider as well.   To learn more about what you can do with MyChart, go to [email or web address].    Your next appointment:   3 month(s)  The format for your next appointment:   In Person  Provider:   Buford Dresser, MD  Other Instructions

## 2020-08-09 NOTE — Progress Notes (Signed)
Cardiology Office Note:    Date:  08/12/2020   ID:  Kathryn Bruce, DOB 17-Jan-1946, MRN [id number]  PCP:  Maurice Small, MD   [facility] Providers Cardiologist:  Buford Dresser, MD {  Referring MD: Maurice Small, MD   Chief Complaint  Patient presents with  . Follow-up    Seen for Dr. Harrell Gave    History of Present Illness:    Kathryn Bruce is a 75 y.o. female with a hx of COVID-19 infection, CAD, hypertension, hyperlipidemia, DM 2, obesity and tobacco use.  Patient had NSTEMI in July 2019 and underwent DES to proximal LAD.  Unfortunately she returned in February 2022 with very late onset in-stent thrombosis.  Distal LAD occlusion was treated with a 3.5 x 15 mm Xience DES, EF was greater than 65%.  Echocardiogram obtained on 04/29/2020 showed EF 50 to 55%, akinesis of the apex, trivial MR.  Patient was last seen by Dr. Harrell Gave on 05/17/2020, glipizide was stopped to avoid hypoglycemia, it was recommended she start Invokana 300 mg daily prior to breakfast.  Given her history of late onset in-stent thrombosis, it was recommended she continue dual antiplatelet therapy indefinitely.  Patient presents today for cardiology follow-up.  She is doing okay on the Invokana.  One of her biggest complaint is thirst which may be side effect of Invokana.  I recommend she keep her self hydrated.  She also feels fatigued and sleepy all the time.  Recent blood work does not show anything abnormal.  She had blood work that was done at her PCPs office which shows total cholesterol 149, HDL 58, LDL 61, triglyceride 180, hemoglobin A1c 7.2, creatinine 0.51, potassium 3.7, and a normal ALT on 07/26/2020.  I recommended increase activity level try to build up tolerance.  Overall, I think she is quite stable from a cardiac perspective.   Past Medical History:  Diagnosis Date  . Acute ST elevation myocardial infarction (STEMI) of anterior wall ([place]) 04/28/2020  . Aortic atherosclerosis ([place]) 10/05/2017   . Arthritis    "spine, knees" (10/06/2017)  . CAD (coronary artery disease), native coronary artery 10/05/2017   10/06/2016 cardiac catheterization showing 99% LAD stenosis, 20 to 30% RCA and circumflex stenosis, 3.5 x 15 mm Sierra stent in proximal LAD by Dr. Angelena Form  . Cancer ([place])    SKIN CANCDER REMOVED FROM NOSE   . Chronic lower back pain   . Diabetes mellitus type 2, noninsulin dependent ([place]) 10/05/2017  . GERD (gastroesophageal reflux disease)   . Hidradenitis   . Hypercholesteremia   . Hyperlipidemia 10/05/2017  . Hypertension   . Obesity (BMI 30-39.9) 10/05/2017  . Osteoarthritis (arthritis due to wear and tear of joints)    right knee  . Sleep apnea    STOP BANG SCORE 5  . Tobacco use 09/05/2011  . Type II diabetes mellitus ([place])     Past Surgical History:  Procedure Laterality Date  . CORONARY ANGIOPLASTY WITH STENT PLACEMENT  10/06/2017  . CORONARY STENT INTERVENTION N/A 10/06/2017   Procedure: CORONARY STENT INTERVENTION;  Surgeon: Burnell Blanks, MD;  Location: [place] [facility];  Service: Cardiovascular;  Laterality: N/A;  . CORONARY/GRAFT ACUTE MI REVASCULARIZATION N/A 04/28/2020   Procedure: Coronary/Graft Acute MI Revascularization;  Surgeon: Troy Sine, MD;  Location: [place] [facility];  Service: Cardiovascular;  Laterality: N/A;  . HERNIA REPAIR    . LEFT HEART CATH AND CORONARY ANGIOGRAPHY N/A 10/06/2017   Procedure: LEFT HEART CATH AND CORONARY ANGIOGRAPHY;  Surgeon: Burnell Blanks, MD;  Location: [place] [facility];  Service: Cardiovascular;  Laterality: N/A;  . LEFT HEART CATH AND CORONARY ANGIOGRAPHY N/A 04/28/2020   Procedure: LEFT HEART CATH AND CORONARY ANGIOGRAPHY;  Surgeon: Troy Sine, MD;  Location: [employer];  Service: Cardiovascular;  Laterality: N/A;  . MULTIPLE TOOTH EXTRACTIONS  2017  . TONSILLECTOMY  1963  . VENTRAL HERNIA REPAIR  09/04/2011   Procedure: LAPAROSCOPIC VENTRAL HERNIA;  Surgeon: Gayland Curry,  MD,FACS;  Location: [facility];  Service: General;  Laterality: N/A;  LAPAROSCOPIC incarcerated VENTRAL HERNIA  repair     Current Medications: Current Meds  Medication Sig  . acetaminophen (TYLENOL) 500 MG tablet Take 1,000 mg by mouth every 8 (eight) hours as needed (pain).   Marland Kitchen aspirin EC 81 MG EC tablet Take 1 tablet (81 mg total) by mouth daily.  Marland Kitchen atorvastatin (LIPITOR) 80 MG tablet Take 1 tablet (80 mg total) by mouth daily.  . blood glucose meter kit and supplies KIT Dispense based on patient and insurance preference. Use up to four times daily as directed.  . canagliflozin (INVOKANA) 300 MG TABS tablet Take 1 tablet (300 mg total) by mouth daily before breakfast.  . DULoxetine (CYMBALTA) 30 MG capsule Take 60 mg by mouth daily.  Marland Kitchen ezetimibe (ZETIA) 10 MG tablet TAKE 1 TABLET(10 MG) BY MOUTH DAILY  . lisinopril (ZESTRIL) 10 MG tablet Take 1 tablet (10 mg total) by mouth daily.  . metFORMIN (GLUCOPHAGE) 500 MG tablet Take 1,000 mg by mouth 2 (two) times daily with a meal.  . metoprolol tartrate (LOPRESSOR) 25 MG tablet Take 1 tablet (25 mg total) by mouth 2 (two) times daily.  . nitroGLYCERIN (NITROSTAT) 0.4 MG SL tablet Place 1 tablet (0.4 mg total) under the tongue every 5 (five) minutes x 3 doses as needed for chest pain.  Marland Kitchen omeprazole (PRILOSEC) 40 MG capsule Take 40 mg by mouth daily.  . ticagrelor (BRILINTA) 90 MG TABS tablet Take 1 tablet (90 mg total) by mouth 2 (two) times daily.  . traMADol (ULTRAM) 50 MG tablet Take by mouth every 6 (six) hours as needed.  . [DISCONTINUED] nicotine (NICODERM CQ - DOSED IN MG/24 HOURS) 14 mg/24hr patch Place 1 patch (14 mg total) onto the skin daily.  . [DISCONTINUED] nicotine (NICODERM CQ - DOSED IN MG/24 HOURS) 21 mg/24hr patch Place 1 patch (21 mg total) onto the skin daily.  . [DISCONTINUED] nicotine (NICODERM CQ - DOSED IN MG/24 HR) 7 mg/24hr patch Place 1 patch (7 mg total) onto the skin daily.     Allergies:   Patient has no known  allergies.   Social History   Socioeconomic History  . Marital status: Widowed    Spouse name: Not on file  . Number of children: Not on file  . Years of education: Not on file  . Highest education level: Not on file  Occupational History  . Not on file  Tobacco Use  . Smoking status: Current Every Day Smoker    Packs/day: 0.50    Years: 42.00    Pack years: 21.00    Types: Cigarettes  . Smokeless tobacco: Never Used  Vaping Use  . Vaping Use: Former  Substance and Sexual Activity  . Alcohol use: No  . Drug use: No  . Sexual activity: Not on file  Other Topics Concern  . Not on file  Social History Narrative  . Not on file   Social Determinants of Health  Financial Resource Strain: Not on file  Food Insecurity: Not on file  Transportation Needs: Not on file  Physical Activity: Not on file  Stress: Not on file  Social Connections: Not on file     Family History: The patient's family history includes Atrial fibrillation in her sister and sister; CAD (age of onset: 43) in her father; Cancer in her maternal grandfather; Cancer (age of onset: 69) in her sister; Dementia (age of onset: 33) in her mother; Lung cancer (age of onset: 62) in her brother; Premature CHD in her son.  ROS:   Please see the history of present illness.     All other systems reviewed and are negative.  EKGs/Labs/Other Studies Reviewed:    The following studies were reviewed today:  Cath 04/28/2020  Dist Cx lesion is 30% stenosed.  1st Mrg lesion is 20% stenosed.  Prox RCA lesion is 10% stenosed.  Dist LAD lesion is 100% stenosed.  Post intervention, there is a 0% residual stenosis.  Prox LAD to Mid LAD lesion is 80% stenosed.  The left ventricular systolic function is normal.  LV end diastolic pressure is normal.  The left ventricular ejection fraction is greater than 65% by visual estimate.   Acute ST segment elevation myocardial infarction secondary to thrombotic subtotal  occlusion within the previously placed proximal LAD stent with probable apical distal embolization.  Mild concomitant nonobstructive CAD involving the left circumflex 20% mild narrowings in the OM1 vessel and AV groove and mild irregularity in the mid RCA.  Successful PCI to the thrombotic lesion in the previously placed Xience 3.5 x 15 mm stent treated with pliant balloon dilatation up to 3.5 mm.  Residual stenosis 0 with brisk TIMI-3 flow.  However there is still probable abrupt cut off at the very apex of the LAD due to initial thrombus embolization  Hyperdynamic LV function with EF estimate greater than 65% without focal wall motion abnormality. LVEDP 12 mm.  RECOMMENDATION: DAPT indefinitely in this patient who had previously undergone stenting of her proximal LAD with subsequent Plavix discontinuation.  Continue Aggrastat 18 hours post procedure.  Aggressive lipid-lowering therapy and optimal blood pressure control.  Smoking cessation is essential.    Echo 04/29/2020 1. Left ventricular ejection fraction, by estimation, is 50 to 55%. The  left ventricle has low normal function. The left ventricle demonstrates  regional wall motion abnormalities (see scoring diagram/findings for  description). Akinesis of apex. There is  mild left ventricular hypertrophy. Left ventricular diastolic parameters  are indeterminate.  2. Right ventricular systolic function is normal. The right ventricular  size is normal. Tricuspid regurgitation signal is inadequate for assessing  PA pressure.  3. The mitral valve is abnormal. Trivial mitral valve regurgitation. No  evidence of mitral stenosis. Moderate mitral annular calcification.  4. The aortic valve is tricuspid. Aortic valve regurgitation is not  visualized. Mild aortic valve sclerosis is present, with no evidence of  aortic valve stenosis.  5. Aortic dilatation noted. There is mild dilatation of the ascending  aorta, measuring 38 mm.  6.  The inferior vena cava is normal in size with greater than 50%  respiratory variability, suggesting right atrial pressure of 3 mmHg.   FINDINGS  Left Ventricle: Left ventricular ejection fraction, by estimation, is 50  to 55%. The left ventricle has low normal function. The left ventricle  demonstrates regional wall motion abnormalities. The left ventricular  internal cavity size was normal in  size. There is mild left ventricular hypertrophy. Left ventricular  diastolic parameters are indeterminate.   EKG:  EKG is not ordered today.    Recent Labs: 04/28/2020: ALT 11 04/30/2020: BUN 11; Creatinine, Ser 0.60; Hemoglobin 12.0; Magnesium 1.7; Platelets 320; Potassium 4.0; Sodium 140  Recent Lipid Panel    Component Value Date/Time   CHOL 203 (H) 04/28/2020 0701   CHOL 166 09/03/2018 1203   TRIG 165 (H) 04/28/2020 0701   HDL 52 04/28/2020 0701   HDL 51 09/03/2018 1203   CHOLHDL 3.9 04/28/2020 0701   VLDL 33 04/28/2020 0701   LDLCALC 118 (H) 04/28/2020 0701   LDLCALC 64 09/03/2018 1203     Risk Assessment/Calculations:       Physical Exam:    VS:  BP 122/77   Pulse 87   Ht '5\' 6"'  (1.676 m)   Wt 196 lb 9.6 oz (89.2 kg)   SpO2 94%   BMI 31.73 kg/m     Wt Readings from Last 3 Encounters:  08/09/20 196 lb 9.6 oz (89.2 kg)  05/03/20 205 lb 12.8 oz (93.4 kg)  04/28/20 210 lb (95.3 kg)     GEN:  Well nourished, well developed in no acute distress HEENT: Normal NECK: No JVD; No carotid bruits LYMPHATICS: No lymphadenopathy CARDIAC: RRR, no murmurs, rubs, gallops RESPIRATORY:  Clear to auscultation without rales, wheezing or rhonchi  ABDOMEN: Soft, non-tender, non-distended MUSCULOSKELETAL:  No edema; No deformity  SKIN: Warm and dry NEUROLOGIC:  Alert and oriented x 3 PSYCHIATRIC:  Normal affect   ASSESSMENT:    1. Coronary artery disease involving native coronary artery of native heart without angina pectoris   2. Essential hypertension   3. Hyperlipidemia LDL  goal <70   4. Controlled type 2 diabetes mellitus without complication, without long-term current use of insulin (HCC)    PLAN:    In order of problems listed above:  1. CAD: Denies any recent chest pain.  Patient had a late onset in-stent thrombosis earlier this year.  On aspirin and Brilinta, plan to continue dual antiplatelet therapy indefinitely  2. Hypertension: Blood pressure stable  3. Hyperlipidemia: On Lipitor  4. DM2: Invokana added during the last visit, patient is tolerating this medication without any issue.  Repeat hemoglobin A1c 7.2 at PCPs office.        Medication Adjustments/Labs and Tests Ordered: Current medicines are reviewed at length with the patient today.  Concerns regarding medicines are outlined above.  No orders of the defined types were placed in this encounter.  No orders of the defined types were placed in this encounter.   Patient Instructions  Medication Instructions:  Your physician recommends that you continue on your current medications as directed. Please refer to the Current Medication list given to you today.  *If you need a refill on your cardiac medications before your next appointment, please call your pharmacy*  Lab Work: NONE ordered at this time of appointment   If you have labs (blood work) drawn today and your tests are completely normal, you will receive your results only by: Marland Kitchen MyChart Message (if you have MyChart) OR . A paper copy in the mail If you have any lab test that is abnormal or we need to change your treatment, we will call you to review the results.  Testing/Procedures: NONE ordered at this time of appointment   Follow-Up: At [facility], you and your health needs are our priority.  As part of our continuing mission to provide you with exceptional heart care, we have created designated Provider  Care Teams.  These Care Teams include your primary Cardiologist (physician) and Advanced Practice Providers (APPs -   Physician Assistants and Nurse Practitioners) who all work together to provide you with the care you need, when you need it.  We recommend signing up for the patient portal called "MyChart".  Sign up information is provided on this After Visit Summary.  MyChart is used to connect with patients for Virtual Visits (Telemedicine).  Patients are able to view lab/test results, encounter notes, upcoming appointments, etc.  Non-urgent messages can be sent to your provider as well.   To learn more about what you can do with MyChart, go to [email or web address].    Your next appointment:   3 month(s)  The format for your next appointment:   In Person  Provider:   Buford Dresser, MD  Other Instructions      Signed, Almyra Deforest, [place]  08/12/2020 12:34 AM    [place]

## 2020-08-12 ENCOUNTER — Encounter: Payer: Self-pay | Admitting: Physician Assistant

## 2020-08-16 ENCOUNTER — Telehealth: Payer: Self-pay | Admitting: *Deleted

## 2020-08-16 NOTE — Telephone Encounter (Signed)
I called patient for 3 month Coordinate-Daibetes study phone call. I left message for patient to call me back.

## 2020-08-20 ENCOUNTER — Telehealth: Payer: Self-pay | Admitting: Cardiology

## 2020-08-20 NOTE — Telephone Encounter (Signed)
This RN called patient back, patient reports she got a call from our office but was not sure what it might be about. This RN did not see any documentation of a call to the patient. Patient does not have any concerns at this time.

## 2020-08-20 NOTE — Telephone Encounter (Signed)
PT is returning a call 

## 2020-08-21 ENCOUNTER — Telehealth: Payer: Self-pay | Admitting: *Deleted

## 2020-08-21 DIAGNOSIS — Z006 Encounter for examination for normal comparison and control in clinical research program: Secondary | ICD-10-CM

## 2020-08-21 NOTE — Telephone Encounter (Signed)
I called patient for 3 month Coordinate-Diabetes Study. We discussed the patients medications. Patient stated Hemoglobin A1C is 7.2 on Invokana. Patient states not feeling well on medication. I recommended that she discuss medication with her physician. We want patient to stay active and eat healthy diet related to diabetes. Patient verbalized understanding.

## 2020-09-24 DIAGNOSIS — Z1231 Encounter for screening mammogram for malignant neoplasm of breast: Secondary | ICD-10-CM | POA: Diagnosis not present

## 2020-10-22 ENCOUNTER — Other Ambulatory Visit: Payer: Self-pay | Admitting: Cardiology

## 2020-11-01 ENCOUNTER — Telehealth: Payer: Self-pay | Admitting: *Deleted

## 2020-11-01 DIAGNOSIS — H35363 Drusen (degenerative) of macula, bilateral: Secondary | ICD-10-CM | POA: Diagnosis not present

## 2020-11-01 DIAGNOSIS — Z006 Encounter for examination for normal comparison and control in clinical research program: Secondary | ICD-10-CM

## 2020-11-01 DIAGNOSIS — E119 Type 2 diabetes mellitus without complications: Secondary | ICD-10-CM | POA: Diagnosis not present

## 2020-11-01 DIAGNOSIS — H25013 Cortical age-related cataract, bilateral: Secondary | ICD-10-CM | POA: Diagnosis not present

## 2020-11-01 DIAGNOSIS — H2513 Age-related nuclear cataract, bilateral: Secondary | ICD-10-CM | POA: Diagnosis not present

## 2020-11-01 NOTE — Telephone Encounter (Signed)
I called patient for 16-monthCoordinate-Diabetes Study. Patient mailbox was full so unable to leave message.

## 2020-11-02 ENCOUNTER — Ambulatory Visit (INDEPENDENT_AMBULATORY_CARE_PROVIDER_SITE_OTHER)
Admission: RE | Admit: 2020-11-02 | Discharge: 2020-11-02 | Disposition: A | Discharge status: Home / Self Care | Payer: Medicare Other | Source: Ambulatory Visit | Attending: Acute Care | Admitting: Acute Care

## 2020-11-02 ENCOUNTER — Other Ambulatory Visit: Payer: Self-pay

## 2020-11-02 DIAGNOSIS — Z87891 Personal history of nicotine dependence: Secondary | ICD-10-CM

## 2020-11-02 DIAGNOSIS — F17210 Nicotine dependence, cigarettes, uncomplicated: Secondary | ICD-10-CM

## 2020-11-06 ENCOUNTER — Telehealth: Payer: Self-pay | Admitting: *Deleted

## 2020-11-06 DIAGNOSIS — Z006 Encounter for examination for normal comparison and control in clinical research program: Secondary | ICD-10-CM

## 2020-11-06 NOTE — Telephone Encounter (Signed)
I called patient for 20-monthCoordinate-Diabetes Study. I left text for patient to call me back.

## 2020-11-08 ENCOUNTER — Encounter: Payer: Self-pay | Admitting: *Deleted

## 2020-11-08 DIAGNOSIS — Z006 Encounter for examination for normal comparison and control in clinical research program: Secondary | ICD-10-CM

## 2020-11-08 NOTE — Research (Signed)
I called patient for 44-monthCoordinate-Diabetes Study. I discussed concomitant medications with patient. Patient stated having trouble affording Brilinta and Invokana.I told her to let Dr. CHarrell Gaveknow at her visit on 11/14/2020. Patient had no adverse events. I encouraged patient to keep a closer watch on blood sugars and exercise daily . Patient stated does watch diet closely.

## 2020-11-14 ENCOUNTER — Other Ambulatory Visit: Payer: Self-pay

## 2020-11-14 ENCOUNTER — Ambulatory Visit (HOSPITAL_BASED_OUTPATIENT_CLINIC_OR_DEPARTMENT_OTHER): Payer: Medicare Other | Admitting: Cardiology

## 2020-11-14 ENCOUNTER — Encounter (HOSPITAL_BASED_OUTPATIENT_CLINIC_OR_DEPARTMENT_OTHER): Payer: Self-pay | Admitting: Cardiology

## 2020-11-14 VITALS — BP 130/80 | HR 66 | Ht 65.0 in | Wt 188.8 lb

## 2020-11-14 DIAGNOSIS — E119 Type 2 diabetes mellitus without complications: Secondary | ICD-10-CM

## 2020-11-14 DIAGNOSIS — E785 Hyperlipidemia, unspecified: Secondary | ICD-10-CM | POA: Diagnosis not present

## 2020-11-14 DIAGNOSIS — I2510 Atherosclerotic heart disease of native coronary artery without angina pectoris: Secondary | ICD-10-CM

## 2020-11-14 DIAGNOSIS — Z7189 Other specified counseling: Secondary | ICD-10-CM

## 2020-11-14 DIAGNOSIS — I10 Essential (primary) hypertension: Secondary | ICD-10-CM

## 2020-11-14 MED ORDER — TICAGRELOR 90 MG PO TABS: 90.0000 mg | ORAL_TABLET | Freq: Two times a day (BID) | ORAL | 3 refills | Status: DC

## 2020-11-14 NOTE — Patient Instructions (Signed)
Medication Instructions:  Your Physician recommend you continue on your current medication as directed.    *If you need a refill on your cardiac medications before your next appointment, please call your pharmacy*   Lab Work: None ordered today   Testing/Procedures: None ordered today   Follow-Up: At [facility], you and your health needs are our priority.  As part of our continuing mission to provide you with exceptional heart care, we have created designated Provider Care Teams.  These Care Teams include your primary Cardiologist (physician) and Advanced Practice Providers (APPs -  Physician Assistants and Nurse Practitioners) who all work together to provide you with the care you need, when you need it.  We recommend signing up for the patient portal called "MyChart".  Sign up information is provided on this After Visit Summary.  MyChart is used to connect with patients for Virtual Visits (Telemedicine).  Patients are able to view lab/test results, encounter notes, upcoming appointments, etc.  Non-urgent messages can be sent to your provider as well.   To learn more about what you can do with MyChart, go to [email or web address].    Your next appointment:   May 14, 2021  The format for your next appointment:   In Person @ 2:20 pm   Provider:   Buford Dresser, MD

## 2020-11-14 NOTE — Progress Notes (Signed)
Cardiology Office Note:    Date:  11/14/2020   ID:  Kathryn Bruce, DOB [birth year], MRN [id number]  PCP:  Maurice Small, MD  Cardiologist:  Buford Dresser, MD  Referring MD: Maurice Small, MD   Chief Complaint:  Follow up  History of Present Illness:    Kathryn Bruce is a 75 y.o. female with a history of prior COVID infection, CAD with NSTEMI in 09/2017 s/p DES to proximal LAD followed by STEMI 04/2020 due to in stent thrombosis, hypertension, hyperlipidemia, type 2 diabetes mellitus, obesity, and tobacco use.   Today: Doesn't have motivation to be active, but can do landscaping, housework, etc if she wants to. Wants to get started walking more when the weather gets better.   Cost of medications is high, especially ticagrelor and invokana. Will discuss with our pharmacy team what options we have for this. As STEMI after clopidogrel discontinuation, not clear failure of therapy.  Remains cigarette abstinent since February, though uses a vape some. Will try to cut this out as well.   Continues to have gradual unintentional weight loss of 20 lbs over the last few years. Appetite is good. Started before she started invokana. Did cut out soda, eating generally healthier over this time. No B symptoms otherwise.  Denies chest pain, shortness of breath at rest or with normal exertion. No PND, orthopnea, LE edema or unexpected weight gain. No syncope or palpitations.   Past Medical History:  Diagnosis Date   Acute ST elevation myocardial infarction (STEMI) of anterior wall ([place]) 04/28/2020   Aortic atherosclerosis ([place]) 10/05/2017   Arthritis    "spine, knees" (10/06/2017)   CAD (coronary artery disease), native coronary artery 10/05/2017   10/06/2016 cardiac catheterization showing 99% LAD stenosis, 20 to 30% RCA and circumflex stenosis, 3.5 x 15 mm Sierra stent in proximal LAD by Dr. Angelena Form   Cancer [facility])    SKIN CANCDER REMOVED FROM NOSE    Chronic lower back pain    Diabetes mellitus  type 2, noninsulin dependent ([place]) 10/05/2017   GERD (gastroesophageal reflux disease)    Hidradenitis    Hypercholesteremia    Hyperlipidemia 10/05/2017   Hypertension    Obesity (BMI 30-39.9) 10/05/2017   Osteoarthritis (arthritis due to wear and tear of joints)    right knee   Sleep apnea    STOP BANG SCORE 5   Tobacco use 09/05/2011   Type II diabetes mellitus ([place])    Past Surgical History:  Procedure Laterality Date   CORONARY ANGIOPLASTY WITH STENT PLACEMENT  10/06/2017   CORONARY STENT INTERVENTION N/A 10/06/2017   Procedure: CORONARY STENT INTERVENTION;  Surgeon: Burnell Blanks, MD;  Location: [place] [facility];  Service: Cardiovascular;  Laterality: N/A;   CORONARY/GRAFT ACUTE MI REVASCULARIZATION N/A 04/28/2020   Procedure: Coronary/Graft Acute MI Revascularization;  Surgeon: Troy Sine, MD;  Location: [place] [facility];  Service: Cardiovascular;  Laterality: N/A;   HERNIA REPAIR     LEFT HEART CATH AND CORONARY ANGIOGRAPHY N/A 10/06/2017   Procedure: LEFT HEART CATH AND CORONARY ANGIOGRAPHY;  Surgeon: Burnell Blanks, MD;  Location: [place] [facility];  Service: Cardiovascular;  Laterality: N/A;   LEFT HEART CATH AND CORONARY ANGIOGRAPHY N/A 04/28/2020   Procedure: LEFT HEART CATH AND CORONARY ANGIOGRAPHY;  Surgeon: Troy Sine, MD;  Location: [place] [facility];  Service: Cardiovascular;  Laterality: N/A;   MULTIPLE TOOTH EXTRACTIONS  2017   TONSILLECTOMY  1963   VENTRAL HERNIA REPAIR  09/04/2011  Procedure: LAPAROSCOPIC VENTRAL HERNIA;  Surgeon: Gayland Curry, MD,FACS;  Location: [facility];  Service: General;  Laterality: N/A;  LAPAROSCOPIC incarcerated VENTRAL HERNIA  repair      Current Meds  Medication Sig   acetaminophen (TYLENOL) 500 MG tablet Take 1,000 mg by mouth every 8 (eight) hours as needed (pain).    aspirin EC 81 MG EC tablet Take 1 tablet (81 mg total) by mouth daily.   atorvastatin (LIPITOR) 80 MG tablet Take 1 tablet (80 mg total)  by mouth daily.   blood glucose meter kit and supplies KIT Dispense based on patient and insurance preference. Use up to four times daily as directed.   canagliflozin (INVOKANA) 300 MG TABS tablet Take 1 tablet (300 mg total) by mouth daily before breakfast.   DULoxetine (CYMBALTA) 30 MG capsule Take 60 mg by mouth daily.   ezetimibe (ZETIA) 10 MG tablet TAKE 1 TABLET(10 MG) BY MOUTH DAILY   lisinopril (ZESTRIL) 10 MG tablet TAKE 1 TABLET(10 MG) BY MOUTH DAILY   metFORMIN (GLUCOPHAGE) 500 MG tablet Take 1,000 mg by mouth 2 (two) times daily with a meal.   metoprolol tartrate (LOPRESSOR) 25 MG tablet TAKE 1 TABLET(25 MG) BY MOUTH TWICE DAILY   nitroGLYCERIN (NITROSTAT) 0.4 MG SL tablet Place 1 tablet (0.4 mg total) under the tongue every 5 (five) minutes x 3 doses as needed for chest pain.   omeprazole (PRILOSEC) 40 MG capsule Take 40 mg by mouth daily.   ticagrelor (BRILINTA) 90 MG TABS tablet Take 1 tablet (90 mg total) by mouth 2 (two) times daily.   traMADol (ULTRAM) 50 MG tablet Take by mouth every 6 (six) hours as needed.     Allergies:   Patient has no known allergies.   Social History   Tobacco Use   Smoking status: Every Day    Packs/day: 0.50    Years: 42.00    Pack years: 21.00    Types: Cigarettes   Smokeless tobacco: Never  Vaping Use   Vaping Use: Former  Substance Use Topics   Alcohol use: No   Drug use: No     Family Hx: The patient's family history includes Atrial fibrillation in her sister and sister; CAD (age of onset: 43) in her father; Cancer in her maternal grandfather; Cancer (age of onset: 63) in her sister; Dementia (age of onset: 12) in her mother; Lung cancer (age of onset: 42) in her brother; Premature CHD in her son.  ROS:   Please see the history of present illness.    All other systems reviewed and are negative.   Prior CV studies:   The following studies were reviewed today: Echo 04/29/20  1. Left ventricular ejection fraction, by estimation,  is 50 to 55%. The  left ventricle has low normal function. The left ventricle demonstrates  regional wall motion abnormalities (see scoring diagram/findings for  description). Akinesis of apex. There is   mild left ventricular hypertrophy. Left ventricular diastolic parameters  are indeterminate.   2. Right ventricular systolic function is normal. The right ventricular  size is normal. Tricuspid regurgitation signal is inadequate for assessing  PA pressure.   3. The mitral valve is abnormal. Trivial mitral valve regurgitation. No  evidence of mitral stenosis. Moderate mitral annular calcification.   4. The aortic valve is tricuspid. Aortic valve regurgitation is not  visualized. Mild aortic valve sclerosis is present, with no evidence of  aortic valve stenosis.   5. Aortic dilatation noted. There is mild dilatation  of the ascending  aorta, measuring 38 mm.   6. The inferior vena cava is normal in size with greater than 50%  respiratory variability, suggesting right atrial pressure of 3 mmHg.   Cath/PCI 04/28/20 Dist Cx lesion is 30% stenosed. 1st Mrg lesion is 20% stenosed. Prox RCA lesion is 10% stenosed. Dist LAD lesion is 100% stenosed. Post intervention, there is a 0% residual stenosis. Prox LAD to Mid LAD lesion is 80% stenosed. The left ventricular systolic function is normal. LV end diastolic pressure is normal. The left ventricular ejection fraction is greater than 65% by visual estimate.   Acute ST segment elevation myocardial infarction secondary to thrombotic subtotal occlusion within the previously placed proximal LAD stent with probable apical distal embolization.   Mild concomitant nonobstructive CAD involving the left circumflex 20% mild narrowings in the OM1 vessel and AV groove and mild irregularity in the mid RCA.   Successful PCI to the thrombotic lesion in the previously placed Xience 3.5 x 15 mm stent treated with pliant balloon dilatation up to 3.5 mm.  Residual  stenosis 0 with brisk TIMI-3 flow.  However there is still probable abrupt cut off at the very apex of the LAD due to initial thrombus embolization   Hyperdynamic LV function with EF estimate greater than 65% without focal wall motion abnormality. LVEDP 12 mm.   RECOMMENDATION: DAPT indefinitely in this patient who had previously undergone stenting of her proximal LAD with subsequent Plavix discontinuation.  Continue Aggrastat 18 hours post procedure.  Aggressive lipid-lowering therapy and optimal blood pressure control.  Smoking cessation is essential.   Echo 03/13/19  1. Left ventricular ejection fraction, by visual estimation, is 65 to 70%. The left ventricle has hyperdynamic function. There is no left ventricular hypertrophy.  2. Left ventricular diastolic parameters are consistent with Grade I diastolic dysfunction (impaired relaxation).  3. The left ventricle has no regional wall motion abnormalities.  4. Global right ventricle has normal systolic function.The right ventricular size is normal. No increase in right ventricular wall thickness.  5. Left atrial size was normal.  6. Right atrial size was normal.  7. The mitral valve is normal in structure. Trivial mitral valve regurgitation. No evidence of mitral stenosis.  8. The tricuspid valve is normal in structure.  9. The aortic valve is normal in structure. Aortic valve regurgitation is not visualized. No evidence of aortic valve sclerosis or stenosis. 10. The pulmonic valve was normal in structure. Pulmonic valve regurgitation is not visualized. 11. Normal pulmonary artery systolic pressure. 12. The inferior vena cava is normal in size with greater than 50% respiratory variability, suggesting right atrial pressure of 3 mmHg.  Labs/Other Tests and Data Reviewed:    EKG:  ECG personally reviewed 05/03/20 sinus bradycardia at 56 bpm, PRWP  Recent Labs: 04/28/2020: ALT 11 04/30/2020: BUN 11; Creatinine, Ser 0.60; Hemoglobin 12.0;  Magnesium 1.7; Platelets 320; Potassium 4.0; Sodium 140   Recent Lipid Panel Lab Results  Component Value Date/Time   CHOL 203 (H) 04/28/2020 07:01 AM   CHOL 166 09/03/2018 12:03 PM   TRIG 165 (H) 04/28/2020 07:01 AM   HDL 52 04/28/2020 07:01 AM   HDL 51 09/03/2018 12:03 PM   CHOLHDL 3.9 04/28/2020 07:01 AM   LDLCALC 118 (H) 04/28/2020 07:01 AM   LDLCALC 64 09/03/2018 12:03 PM    Wt Readings from Last 3 Encounters:  11/14/20 188 lb 12.8 oz (85.6 kg)  08/09/20 196 lb 9.6 oz (89.2 kg)  05/03/20 205 lb 12.8 oz (  93.4 kg)     Objective:    Vital Signs:  BP 130/80   Pulse 66   Ht _0  (1.651 m)   Wt 188 lb 12.8 oz (85.6 kg)   SpO2 96%   BMI 31.42 kg/m    GEN: Well nourished, well developed in no acute distress HEENT: Normal, moist mucous membranes NECK: No JVD CARDIAC: regular rhythm, normal S1 and S2, no rubs or gallops. No murmur. VASCULAR: Radial and DP pulses 2+ bilaterally. No carotid bruits RESPIRATORY:  Clear to auscultation without rales, wheezing or rhonchi  ABDOMEN: Soft, non-tender, non-distended MUSCULOSKELETAL:  Ambulates independently SKIN: Warm and dry, no edema NEUROLOGIC:  Alert and oriented x 3. No focal neuro deficits noted. PSYCHIATRIC:  Normal affect    ASSESSMENT & PLAN:    Coronary artery disease involving native coronary artery of native heart without angina pectoris - Plan: ticagrelor (BRILINTA) 90 MG TABS tablet  Essential hypertension  Hyperlipidemia LDL goal <70  Controlled type 2 diabetes mellitus without complication, without long-term current use of insulin (HCC)  Cardiac risk counseling  Counseling on health promotion and disease prevention  CAD with prior PCI for NSTEMI and then STEMI, aortic atherosclerosis: -continue aspirin 81 mg and ticagrelor 90 mg BID. Given in stent thrombosis more than 1 year since prior PCI, would continue DAPT indefinitely. If cost is an issue, may need to change to clopidogrel in the future.  -continue  atorvastatin 40 mg -counseled on red flag warning signs that need immediate medical attention   Tobacco use: no longer smoking cigarettes, discussed quitting vaping as well, she will work on this   HLD: goal LDL <70 -continue both atorvastatin and ezetimibe -LDL last  61 on 07/26/20  Hypertension: goal of <130/80 -continue metoprolol 25 mg BID given STEMI history -continue lisinopril 10 mg daily  Type II diabetes, with CAD history -most recent A1c 7.2  -see prior note for discussion of medications. I prefer Jardiance or Wilder Glade, but Anastasio Auerbach is what is preferred based on her insurance information. I will ask our pharmacy team to see if we can find a program to assist with cost of SGLT2i -she is enrolled in the Coordinate-diabetes trial -continue metformin, stopped glipizide with start of SGLT2i   Cardiac risk counseling and prevention recommendations: -recommend heart healthy/Mediterranean diet, with whole grains, fruits, vegetable, fish, lean meats, nuts, and olive oil. Limit salt. -recommend moderate walking, 3-5 times/week for 30-50 minutes each session. Aim for at least 150 minutes.week. Goal should be pace of 3 miles/hours, or walking 1.5 miles in 30 minutes -recommend avoidance of tobacco products. Avoid excess alcohol.  Follow up: 6 mos or sooner as needed  Buford Dresser, MD, PhD, [place]  [address] [place], [place] [address] [phone]  Meds ordered this encounter  Medications   ticagrelor (BRILINTA) 90 MG TABS tablet    Sig: Take 1 tablet (90 mg total) by mouth 2 (two) times daily.    Dispense:  180 tablet    Refill:  3    No orders of the defined types were placed in this encounter.  Patient Instructions  Medication Instructions:  Your Physician recommend you continue on your current medication as directed.    *If you need a refill on your cardiac medications before your next appointment, please call your  pharmacy*   Lab Work: None ordered today   Testing/Procedures: None ordered today   Follow-Up: At [facility], you and your health needs are  our priority.  As part of our continuing mission to provide you with exceptional heart care, we have created designated Provider Care Teams.  These Care Teams include your primary Cardiologist (physician) and Advanced Practice Providers (APPs -  Physician Assistants and Nurse Practitioners) who all work together to provide you with the care you need, when you need it.  We recommend signing up for the patient portal called "MyChart".  Sign up information is provided on this After Visit Summary.  MyChart is used to connect with patients for Virtual Visits (Telemedicine).  Patients are able to view lab/test results, encounter notes, upcoming appointments, etc.  Non-urgent messages can be sent to your provider as well.   To learn more about what you can do with MyChart, go to [email or web address].    Your next appointment:   May 14, 2021  The format for your next appointment:   In Person @ 2:20 pm   Provider:   Buford Dresser, MD     Signed, Buford Dresser, MD  11/14/2020   [place]

## 2020-11-14 NOTE — Progress Notes (Signed)
Please call patient and let them  know their  low dose Ct was read as a Lung RADS 2: nodules that are benign in appearance and behavior with a very low likelihood of becoming a clinically active cancer due to size or lack of growth. Recommendation per radiology is for a repeat LDCT in 12 months. .Please let them  know we will order and schedule their  annual screening scan for 10/2021. Please let them  know there was notation of CAD on their  scan.  Please remind the patient  that this is a non-gated exam therefore degree or severity of disease  cannot be determined. Please have them  follow up with their PCP regarding potential risk factor modification, dietary therapy or pharmacologic therapy if clinically indicated. Pt.  is  currently on statin therapy. Please place order for annual  screening scan for  10/2021 and fax results to PCP. Thanks so much.  + Coronary artery calcifications, aortic Atherosclerosis , On statin, seen by cards with an echo and a heart cath.

## 2020-11-15 ENCOUNTER — Other Ambulatory Visit: Payer: Self-pay | Admitting: *Deleted

## 2020-11-15 DIAGNOSIS — F17210 Nicotine dependence, cigarettes, uncomplicated: Secondary | ICD-10-CM

## 2020-11-15 DIAGNOSIS — Z87891 Personal history of nicotine dependence: Secondary | ICD-10-CM

## 2020-11-20 ENCOUNTER — Encounter (HOSPITAL_BASED_OUTPATIENT_CLINIC_OR_DEPARTMENT_OTHER): Payer: Self-pay | Admitting: Cardiology

## 2020-11-26 ENCOUNTER — Other Ambulatory Visit (HOSPITAL_BASED_OUTPATIENT_CLINIC_OR_DEPARTMENT_OTHER): Payer: Self-pay

## 2020-11-26 DIAGNOSIS — I2510 Atherosclerotic heart disease of native coronary artery without angina pectoris: Secondary | ICD-10-CM

## 2020-11-26 MED ORDER — TICAGRELOR 90 MG PO TABS: 90.0000 mg | ORAL_TABLET | Freq: Two times a day (BID) | ORAL | 3 refills | Status: DC

## 2020-11-27 ENCOUNTER — Telehealth (HOSPITAL_BASED_OUTPATIENT_CLINIC_OR_DEPARTMENT_OTHER): Payer: Self-pay

## 2020-11-27 NOTE — Telephone Encounter (Signed)
Brilinta prescription faxed to [facility] [facility] [place] patient assistance foundation.   Will await approval.

## 2020-11-28 ENCOUNTER — Telehealth (HOSPITAL_BASED_OUTPATIENT_CLINIC_OR_DEPARTMENT_OTHER): Payer: Self-pay

## 2020-11-28 NOTE — Telephone Encounter (Signed)
Attempted to contact pt regarding the below message. Left message to call back.    Per pharmacy, Kathryn Bruce is covered on her formulary as a Tier 3 med ($37/1 month or cost savings of $74/3 month supply) which is the same copay as Invokana; Wilder Glade is not covered. It would therefore be cheapest to do a 3 mos supply of Jardiance 10 mg daily, but is this what she is already paying with Invokana? I would prefer the Jardiance, as there is more data on it. There is also patient assistance for Jardiance at [email or web address]://[email or web address].us/sites/us/files/files/bi_cares_pap_application.pdf   Is this cost feasible for her? If the $74/50mos is too much, we could briefly give samples while we await patient assistance. Thanks!

## 2020-11-29 MED ORDER — EMPAGLIFLOZIN 10 MG PO TABS: 10.0000 mg | ORAL_TABLET | Freq: Every day | ORAL | 3 refills | Status: DC

## 2020-11-29 NOTE — Telephone Encounter (Signed)
Patient walked in today wanting to know about Brilinta patient assistance Rx Advised patient per Lars Mage she faxed yesterday  Samples of Brilinta 90 mg #3 lot # [id number] exp 12/08/22

## 2020-11-29 NOTE — Telephone Encounter (Signed)
Spoke with patient and she is getting close to donut hole and her Anastasio Auerbach is going up in price.  She thinks she is in a study for Invokana and spoke with Anderson Malta after being d/c from hospital  Wants to know if Dr Harrell Gave thinks [name] would be better   Will forward to Dr Harrell Gave for review

## 2020-11-29 NOTE — Telephone Encounter (Signed)
I have spoken to the pharmacy and sent a note re: cost (see prior notes). It appears that [place] were the same in price, but there are patient assistance options that are better for Jardiance. I would recommend changing her to jardiance 10 mg daily and seeing what the cost is. If it is not affordable for her, then we can at least try patient assistance. As she will be changing scripts, I would give Jardiance samples while we are working on cost for her.

## 2020-11-29 NOTE — Telephone Encounter (Signed)
Pt updated with MD's recommendations and state she is agreeable to plan. Pt report she did however just pick up a new 30 day supply of Invokana. Pt state she will complete that bottle then start new medications. Pt state she will also call for samples if needed.

## 2020-12-19 NOTE — Telephone Encounter (Signed)
Brilinta samples placed up front for pick up.  Brilinta 90 mg Qty: 2 bottles Lot # [id number] Exp: 12/08/22

## 2020-12-25 ENCOUNTER — Telehealth (HOSPITAL_BASED_OUTPATIENT_CLINIC_OR_DEPARTMENT_OTHER): Payer: Self-pay | Admitting: *Deleted

## 2020-12-25 DIAGNOSIS — I2510 Atherosclerotic heart disease of native coronary artery without angina pectoris: Secondary | ICD-10-CM

## 2020-12-25 MED ORDER — TICAGRELOR 90 MG PO TABS: 90.0000 mg | ORAL_TABLET | Freq: Two times a day (BID) | ORAL | 3 refills | Status: DC

## 2020-12-25 NOTE — Telephone Encounter (Signed)
Patient brought note by office needing Rx for Brilinta be called to [facility] [phone] V/O for  Brlinta 90 mg twice a day given to [place] patient, verbalized understanding

## 2020-12-31 NOTE — Telephone Encounter (Signed)
*  STAT* If patient is at the pharmacy, call can be transferred to refill team.   1. Which medications need to be refilled? (please list name of each medication and dose if known)  ticagrelor (BRILINTA) 90 MG TABS tablet  2. Which pharmacy/location (including street and city if local pharmacy) is medication to be sent to?  [facility] - [place], [place] they need a 30 day or 90 day supply?  90 day supply     Calling stating this needs to faxed to [phone]. Prescription has not been received.

## 2020-12-31 NOTE — Telephone Encounter (Signed)
Called [facility] [phone] x 2 On hold over 20 minutes each time  Will forward to [place] with Dr Harrell Gave to follow up   Left message for patient to call back so could update her

## 2021-01-02 NOTE — Telephone Encounter (Signed)
Left message to call back  

## 2021-01-04 NOTE — Telephone Encounter (Signed)
Attempted to contact [facility] and received an automatic message stating they are currently closed for a training or meeting.

## 2021-01-09 NOTE — Telephone Encounter (Signed)
Called [employer] and was informed pt's Brilinta patient assistance was approved from 01/08/21-03/09/21.  Called pt to make aware. Left message to call back.

## 2021-01-10 ENCOUNTER — Telehealth: Payer: Self-pay | Admitting: *Deleted

## 2021-01-10 DIAGNOSIS — Z006 Encounter for examination for normal comparison and control in clinical research program: Secondary | ICD-10-CM

## 2021-01-10 NOTE — Telephone Encounter (Signed)
I called patient for 12 month Coordinate-Diabetes phone call. I left message for patient to call me back.

## 2021-01-14 ENCOUNTER — Telehealth: Payer: Self-pay | Admitting: *Deleted

## 2021-01-14 DIAGNOSIS — Z006 Encounter for examination for normal comparison and control in clinical research program: Secondary | ICD-10-CM

## 2021-01-14 NOTE — Telephone Encounter (Signed)
Patient returned my call for Coordinate-Diabetes Study. We did the 69-month study call. Patient is doing well.Last hemoglobin A!C was 7.2%. Patient remains on Invokana and is tolerating medication well. I encouraged her to exercise daily and follow-up with primary doctor for diabetes control.  Patient will continue in research study with [facility] for up to 5 years. I have sent the consents in the mail to her. I asked her to sign the consents and send back in our postage paid envelope.

## 2021-01-14 NOTE — Telephone Encounter (Signed)
I called patient for 43-month Coordinate-Diabetes Study.I left message for patient to call me back.

## 2021-01-20 ENCOUNTER — Other Ambulatory Visit: Payer: Self-pay | Admitting: Cardiology

## 2021-04-25 ENCOUNTER — Encounter: Payer: Self-pay | Admitting: *Deleted

## 2021-04-25 DIAGNOSIS — Z006 Encounter for examination for normal comparison and control in clinical research program: Secondary | ICD-10-CM

## 2021-04-25 NOTE — Research (Signed)
I met with patient today to make some corrections on the 2 consents she signed on Sept 7,2022 for Coordinate-Diabetes Study. Patient had put the wrong date on the consents and I had her correct the dates. I told her that [facility] will follow her now for the completion of study.

## 2021-04-25 NOTE — Research (Signed)
Late entry 11/14/2020: Kathryn Bruce signed Coordinate Diabetes consent per conversation with Sandie Ano 11/08/2020.

## 2021-05-14 ENCOUNTER — Ambulatory Visit (HOSPITAL_BASED_OUTPATIENT_CLINIC_OR_DEPARTMENT_OTHER): Payer: Medicare Other | Admitting: Cardiology

## 2021-05-24 DIAGNOSIS — E1142 Type 2 diabetes mellitus with diabetic polyneuropathy: Secondary | ICD-10-CM | POA: Diagnosis not present

## 2021-05-24 DIAGNOSIS — I2510 Atherosclerotic heart disease of native coronary artery without angina pectoris: Secondary | ICD-10-CM | POA: Diagnosis not present

## 2021-05-24 DIAGNOSIS — E1169 Type 2 diabetes mellitus with other specified complication: Secondary | ICD-10-CM | POA: Diagnosis not present

## 2021-05-24 DIAGNOSIS — I700 Atherosclerosis of aorta: Secondary | ICD-10-CM | POA: Diagnosis not present

## 2021-05-24 DIAGNOSIS — Z79899 Other long term (current) drug therapy: Secondary | ICD-10-CM | POA: Diagnosis not present

## 2021-05-24 DIAGNOSIS — J432 Centrilobular emphysema: Secondary | ICD-10-CM | POA: Diagnosis not present

## 2021-05-24 NOTE — Progress Notes (Incomplete)
?Cardiology Office Note:   ? ?Date:  05/24/2021  ? ?ID:  Kathryn Bruce, DOB Feb 17, 1946, MRN [id number] ? ?PCP:  Maurice Small, MD  ?Cardiologist:  Buford Dresser, MD ? ?Referring MD: Maurice Small, MD  ? ?Chief Complaint:  Follow up ? ?History of Present Illness:   ? ?Kathryn Bruce is a 76 y.o. female with a history of prior COVID infection, CAD with NSTEMI in 09/2017 s/p DES to proximal LAD followed by STEMI 04/2020 due to in stent thrombosis, hypertension, hyperlipidemia, type 2 diabetes mellitus, obesity, and tobacco use.  ? ?Today: ?Doesn't have motivation to be active, but can do landscaping, housework, etc if she wants to. Wants to get started walking more when the weather gets better.  ? ?Cost of medications is high, especially ticagrelor and invokana. Will discuss with our pharmacy team what options we have for this. As STEMI after clopidogrel discontinuation, not clear failure of therapy. ? ?Remains cigarette abstinent since February, though uses a vape some. Will try to cut this out as well.  ? ?Continues to have gradual unintentional weight loss of 20 lbs over the last few years. Appetite is good. Started before she started invokana. Did cut out soda, eating generally healthier over this time. No B symptoms otherwise. ? ?Denies chest pain, shortness of breath at rest or with normal exertion. No PND, orthopnea, LE edema or unexpected weight gain. No syncope or palpitations.  ? ?Past Medical History:  ?Diagnosis Date  ? Acute ST elevation myocardial infarction (STEMI) of anterior wall ([place]) 04/28/2020  ? Aortic atherosclerosis ([place]) 10/05/2017  ? Arthritis   ? "spine, knees" (10/06/2017)  ? CAD (coronary artery disease), native coronary artery 10/05/2017  ? 10/06/2016 cardiac catheterization showing 99% LAD stenosis, 20 to 30% RCA and circumflex stenosis, 3.5 x 15 mm [place] stent in proximal LAD by Dr. Angelena Form  ? Cancer [facility])   ? SKIN CANCDER REMOVED FROM NOSE   ? Chronic lower back pain   ? Diabetes mellitus  type 2, noninsulin dependent ([place]) 10/05/2017  ? GERD (gastroesophageal reflux disease)   ? Hidradenitis   ? Hypercholesteremia   ? Hyperlipidemia 10/05/2017  ? Hypertension   ? Obesity (BMI 30-39.9) 10/05/2017  ? Osteoarthritis (arthritis due to wear and tear of joints)   ? right knee  ? Sleep apnea   ? STOP BANG SCORE 5  ? Tobacco use 09/05/2011  ? Type II diabetes mellitus ([place])   ? ?Past Surgical History:  ?Procedure Laterality Date  ? CORONARY ANGIOPLASTY WITH STENT PLACEMENT  10/06/2017  ? CORONARY STENT INTERVENTION N/A 10/06/2017  ? Procedure: CORONARY STENT INTERVENTION;  Surgeon: Burnell Blanks, MD;  Location: [place] [facility];  Service: Cardiovascular;  Laterality: N/A;  ? CORONARY/GRAFT ACUTE MI REVASCULARIZATION N/A 04/28/2020  ? Procedure: Coronary/Graft Acute MI Revascularization;  Surgeon: Troy Sine, MD;  Location: [place] [facility];  Service: Cardiovascular;  Laterality: N/A;  ? HERNIA REPAIR    ? LEFT HEART CATH AND CORONARY ANGIOGRAPHY N/A 10/06/2017  ? Procedure: LEFT HEART CATH AND CORONARY ANGIOGRAPHY;  Surgeon: Burnell Blanks, MD;  Location: [place] [facility];  Service: Cardiovascular;  Laterality: N/A;  ? LEFT HEART CATH AND CORONARY ANGIOGRAPHY N/A 04/28/2020  ? Procedure: LEFT HEART CATH AND CORONARY ANGIOGRAPHY;  Surgeon: Troy Sine, MD;  Location: [place] [facility];  Service: Cardiovascular;  Laterality: N/A;  ? MULTIPLE TOOTH EXTRACTIONS  2017  ? TONSILLECTOMY  1963  ? VENTRAL HERNIA REPAIR  09/04/2011  ?  Procedure: LAPAROSCOPIC VENTRAL HERNIA;  Surgeon: Gayland Curry, MD,FACS;  Location: [facility];  Service: General;  Laterality: N/A;  LAPAROSCOPIC incarcerated VENTRAL HERNIA  repair ?  ?  ? ?No outpatient medications have been marked as taking for the 05/27/21 encounter (Appointment) with Buford Dresser, MD.  ?  ? ?Allergies:   Patient has no known allergies.  ? ?Social History  ? ?Tobacco Use  ? Smoking status: Every Day  ?  Packs/day: 0.50  ?  Years:  42.00  ?  Pack years: 21.00  ?  Types: Cigarettes  ? Smokeless tobacco: Never  ?Vaping Use  ? Vaping Use: Former  ?Substance Use Topics  ? Alcohol use: No  ? Drug use: No  ?  ? ?Family Hx: ?The patient's family history includes Atrial fibrillation in her sister and sister; CAD (age of onset: 30) in her father; Cancer in her maternal grandfather; Cancer (age of onset: 70) in her sister; Dementia (age of onset: 69) in her mother; Lung cancer (age of onset: 71) in her brother; Premature CHD in her son. ? ?ROS:   ?Please see the history of present illness.    ?All other systems reviewed and are negative. ? ? ?Prior CV studies:   ?The following studies were reviewed today: ?Echo 04/29/20 ? 1. Left ventricular ejection fraction, by estimation, is 50 to 55%. The  ?left ventricle has low normal function. The left ventricle demonstrates  ?regional wall motion abnormalities (see scoring diagram/findings for  ?description). Akinesis of apex. There is  ? mild left ventricular hypertrophy. Left ventricular diastolic parameters  ?are indeterminate.  ? 2. Right ventricular systolic function is normal. The right ventricular  ?size is normal. Tricuspid regurgitation signal is inadequate for assessing  ?PA pressure.  ? 3. The mitral valve is abnormal. Trivial mitral valve regurgitation. No  ?evidence of mitral stenosis. Moderate mitral annular calcification.  ? 4. The aortic valve is tricuspid. Aortic valve regurgitation is not  ?visualized. Mild aortic valve sclerosis is present, with no evidence of  ?aortic valve stenosis.  ? 5. Aortic dilatation noted. There is mild dilatation of the ascending  ?aorta, measuring 38 mm.  ? 6. The inferior vena cava is normal in size with greater than 50%  ?respiratory variability, suggesting right atrial pressure of 3 mmHg.  ? ?Cath/PCI 04/28/20 ?Dist Cx lesion is 30% stenosed. ?1st Mrg lesion is 20% stenosed. ?Prox RCA lesion is 10% stenosed. ?Dist LAD lesion is 100% stenosed. ?Post intervention,  there is a 0% residual stenosis. ?Prox LAD to Mid LAD lesion is 80% stenosed. ?The left ventricular systolic function is normal. ?LV end diastolic pressure is normal. ?The left ventricular ejection fraction is greater than 65% by visual estimate. ?  ?Acute ST segment elevation myocardial infarction secondary to thrombotic subtotal occlusion within the previously placed proximal LAD stent with probable apical distal embolization. ?  ?Mild concomitant nonobstructive CAD involving the left circumflex 20% mild narrowings in the OM1 vessel and AV groove and mild irregularity in the mid RCA. ?  ?Successful PCI to the thrombotic lesion in the previously placed Xience 3.5 x 15 mm stent treated with pliant balloon dilatation up to 3.5 mm.  Residual stenosis 0 with brisk TIMI-3 flow.  However there is still probable abrupt cut off at the very apex of the LAD due to initial thrombus embolization ?  ?Hyperdynamic LV function with EF estimate greater than 65% without focal wall motion abnormality. LVEDP 12 mm. ?  ?RECOMMENDATION: ?DAPT indefinitely in this patient  who had previously undergone stenting of her proximal LAD with subsequent Plavix discontinuation.  Continue Aggrastat 18 hours post procedure.  Aggressive lipid-lowering therapy and optimal blood pressure control.  Smoking cessation is essential. ? ? ?Echo 03/13/19 ? 1. Left ventricular ejection fraction, by visual estimation, is 65 to 70%. The left ventricle has hyperdynamic function. There is no left ventricular hypertrophy. ? 2. Left ventricular diastolic parameters are consistent with Grade I diastolic dysfunction (impaired relaxation). ? 3. The left ventricle has no regional wall motion abnormalities. ? 4. Global right ventricle has normal systolic function.The right ventricular size is normal. No increase in right ventricular wall thickness. ? 5. Left atrial size was normal. ? 6. Right atrial size was normal. ? 7. The mitral valve is normal in structure. Trivial  mitral valve regurgitation. No evidence of mitral stenosis. ? 8. The tricuspid valve is normal in structure. ? 9. The aortic valve is normal in structure. Aortic valve regurgitation is not visualized. No evi

## 2021-05-27 ENCOUNTER — Ambulatory Visit (HOSPITAL_BASED_OUTPATIENT_CLINIC_OR_DEPARTMENT_OTHER): Payer: Medicare Other | Admitting: Cardiology

## 2021-06-10 ENCOUNTER — Other Ambulatory Visit: Payer: Self-pay

## 2021-06-10 MED ORDER — EZETIMIBE 10 MG PO TABS: ORAL_TABLET | ORAL | 0 refills | Status: DC

## 2021-07-05 DIAGNOSIS — Z79899 Other long term (current) drug therapy: Secondary | ICD-10-CM | POA: Diagnosis not present

## 2021-07-26 ENCOUNTER — Ambulatory Visit (HOSPITAL_BASED_OUTPATIENT_CLINIC_OR_DEPARTMENT_OTHER): Payer: Medicare Other | Admitting: Cardiology

## 2021-07-26 ENCOUNTER — Encounter (HOSPITAL_BASED_OUTPATIENT_CLINIC_OR_DEPARTMENT_OTHER): Payer: Self-pay | Admitting: Cardiology

## 2021-07-26 VITALS — BP 152/90 | HR 75 | Ht 65.0 in | Wt 187.6 lb

## 2021-07-26 DIAGNOSIS — I10 Essential (primary) hypertension: Secondary | ICD-10-CM

## 2021-07-26 DIAGNOSIS — E785 Hyperlipidemia, unspecified: Secondary | ICD-10-CM

## 2021-07-26 DIAGNOSIS — I2510 Atherosclerotic heart disease of native coronary artery without angina pectoris: Secondary | ICD-10-CM | POA: Diagnosis not present

## 2021-07-26 DIAGNOSIS — Z79899 Other long term (current) drug therapy: Secondary | ICD-10-CM | POA: Diagnosis not present

## 2021-07-26 DIAGNOSIS — I252 Old myocardial infarction: Secondary | ICD-10-CM | POA: Diagnosis not present

## 2021-07-26 DIAGNOSIS — E119 Type 2 diabetes mellitus without complications: Secondary | ICD-10-CM

## 2021-07-26 MED ORDER — LISINOPRIL 20 MG PO TABS: 20.0000 mg | ORAL_TABLET | Freq: Every day | ORAL | 11 refills | Status: DC

## 2021-07-26 NOTE — Patient Instructions (Signed)
Medication Instructions:  Increase lisinopril to 20 mg daily. Please keep a log of your blood pressure. If blood pressure is consistently greater than 130/80, increase to 30 mg daily.   *If you need a refill on your cardiac medications before your next appointment, please call your pharmacy*   Lab Work: Your provider has recommended lab work in 2-3 weeks (BMP). Please have this collected at [facility] [place]. The lab is open 8:00 am - 4:30 pm. Please avoid 12:00p - 1:00p for lunch hour. You do not need an appointment. Please go to [address] [place], [place] [id number]. This is in the Primary Care office on the 3rd floor, let them know you are there for blood work and they will direct you to the lab.   If you have labs (blood work) drawn today and your tests are completely normal, you will receive your results only by: [place] (if you have MyChart) OR A paper copy in the mail If you have any lab test that is abnormal or we need to change your treatment, we will call you to review the results.   Testing/Procedures: None ordered today   Follow-Up: At [facility], you and your health needs are our priority.  As part of our continuing mission to provide you with exceptional heart care, we have created designated Provider Care Teams.  These Care Teams include your primary Cardiologist (physician) and Advanced Practice Providers (APPs -  Physician Assistants and Nurse Practitioners) who all work together to provide you with the care you need, when you need it.  We recommend signing up for the patient portal called "MyChart".  Sign up information is provided on this After Visit Summary.  MyChart is used to connect with patients for Virtual Visits (Telemedicine).  Patients are able to view lab/test results, encounter notes, upcoming appointments, etc.  Non-urgent messages can be sent to your provider as well.   To learn more about what you can do with  MyChart, go to [email or web address].    Your next appointment:   2 month(s)  The format for your next appointment:   In Person  Provider:   Buford Dresser, MD{   Important Information About [place]

## 2021-07-26 NOTE — Progress Notes (Signed)
Cardiology Office Note:    Date:  07/26/2021   ID:  Kathryn Bruce, DOB [birth year], MRN [id number]  PCP:  Maurice Small, MD  Cardiologist:  Buford Dresser, MD  Referring MD: Maurice Small, MD   Chief Complaint:  Follow up  History of Present Illness:    Kathryn Bruce is a 76 y.o. female with a history of prior COVID infection, CAD with NSTEMI in 09/2017 s/p DES to proximal LAD followed by STEMI 04/2020 due to in stent thrombosis, hypertension, hyperlipidemia, type 2 diabetes mellitus, obesity, and tobacco use.   Today: Lately she is struggling with her blood pressure, which she believes has been ongoing since before she was started on Jardiance. She admits to some confusion with her medication, but has been taking her antihypertensives as prescribed for the past 1.5 months. In clinic today her BP is 152/90 which she notes is typical for her, and also correlates with her own BP cuff which she brought today. However, she does note that her readings are not always consistent at home.  Over the past 2 years she has lost at least 30 lbs although she was not actively trying to lose weight. Typically she has a healthy appetite, and will avoid ordering out for her meals.   Also, she complains of frequent rhinorrhea.  She believes she likely has neuropathy due to her chronic foot pain. She also endorses arthritis.  Of note, she periodically has sputum production that is whitish or greenish. After reviewing her own medical records, she is concerned that she may have COPD. I advised her to follow-up with her PCP regarding this concern.  She denies any palpitations, chest pain, or peripheral edema. No lightheadedness, headaches, syncope, orthopnea, or PND. No hematuria or hematochezia.  Past Medical History:  Diagnosis Date   Acute ST elevation myocardial infarction (STEMI) of anterior wall ([place]) 04/28/2020   Aortic atherosclerosis ([place]) 10/05/2017   Arthritis    "spine, knees" (10/06/2017)    CAD (coronary artery disease), native coronary artery 10/05/2017   10/06/2016 cardiac catheterization showing 99% LAD stenosis, 20 to 30% RCA and circumflex stenosis, 3.5 x 15 mm Sierra stent in proximal LAD by Dr. Angelena Form   Cancer [facility])    SKIN CANCDER REMOVED FROM NOSE    Chronic lower back pain    Diabetes mellitus type 2, noninsulin dependent ([place]) 10/05/2017   GERD (gastroesophageal reflux disease)    Hidradenitis    Hypercholesteremia    Hyperlipidemia 10/05/2017   Hypertension    Obesity (BMI 30-39.9) 10/05/2017   Osteoarthritis (arthritis due to wear and tear of joints)    right knee   Sleep apnea    STOP BANG SCORE 5   Tobacco use 09/05/2011   Type II diabetes mellitus ([place])    Past Surgical History:  Procedure Laterality Date   CORONARY ANGIOPLASTY WITH STENT PLACEMENT  10/06/2017   CORONARY STENT INTERVENTION N/A 10/06/2017   Procedure: CORONARY STENT INTERVENTION;  Surgeon: Burnell Blanks, MD;  Location: [place] [facility];  Service: Cardiovascular;  Laterality: N/A;   CORONARY/GRAFT ACUTE MI REVASCULARIZATION N/A 04/28/2020   Procedure: Coronary/Graft Acute MI Revascularization;  Surgeon: Troy Sine, MD;  Location: [place] [facility];  Service: Cardiovascular;  Laterality: N/A;   HERNIA REPAIR     LEFT HEART CATH AND CORONARY ANGIOGRAPHY N/A 10/06/2017   Procedure: LEFT HEART CATH AND CORONARY ANGIOGRAPHY;  Surgeon: Burnell Blanks, MD;  Location: [place] [facility];  Service: Cardiovascular;  Laterality: N/A;  LEFT HEART CATH AND CORONARY ANGIOGRAPHY N/A 04/28/2020   Procedure: LEFT HEART CATH AND CORONARY ANGIOGRAPHY;  Surgeon: Troy Sine, MD;  Location: [place] [facility];  Service: Cardiovascular;  Laterality: N/A;   MULTIPLE TOOTH EXTRACTIONS  2017   TONSILLECTOMY  1963   VENTRAL HERNIA REPAIR  09/04/2011   Procedure: LAPAROSCOPIC VENTRAL HERNIA;  Surgeon: Gayland Curry, MD,FACS;  Location: [facility];  Service: General;  Laterality: N/A;   LAPAROSCOPIC incarcerated VENTRAL HERNIA  repair      Current Meds  Medication Sig   acetaminophen (TYLENOL) 500 MG tablet Take 1,000 mg by mouth every 8 (eight) hours as needed (pain).    aspirin EC 81 MG EC tablet Take 1 tablet (81 mg total) by mouth daily.   atorvastatin (LIPITOR) 80 MG tablet TAKE 1 TABLET(80 MG) BY MOUTH DAILY   blood glucose meter kit and supplies KIT Dispense based on patient and insurance preference. Use up to four times daily as directed.   DULoxetine (CYMBALTA) 60 MG capsule Take 1 capsule by mouth daily.   empagliflozin (JARDIANCE) 10 MG TABS tablet Take 1 tablet (10 mg total) by mouth daily before breakfast.   ezetimibe (ZETIA) 10 MG tablet TAKE 1 TABLET(10 MG) BY MOUTH DAILY   metFORMIN (GLUCOPHAGE) 500 MG tablet Take 1,000 mg by mouth 2 (two) times daily with a meal.   metoprolol tartrate (LOPRESSOR) 25 MG tablet TAKE 1 TABLET(25 MG) BY MOUTH TWICE DAILY   nitroGLYCERIN (NITROSTAT) 0.4 MG SL tablet Place 1 tablet (0.4 mg total) under the tongue every 5 (five) minutes x 3 doses as needed for chest pain.   omeprazole (PRILOSEC) 40 MG capsule Take 40 mg by mouth daily.   ticagrelor (BRILINTA) 90 MG TABS tablet Take 1 tablet (90 mg total) by mouth 2 (two) times daily.   traMADol (ULTRAM) 50 MG tablet Take by mouth every 6 (six) hours as needed.   [DISCONTINUED] lisinopril (ZESTRIL) 10 MG tablet TAKE 1 TABLET(10 MG) BY MOUTH DAILY     Allergies:   Patient has no known allergies.   Social History   Tobacco Use   Smoking status: Every Day    Packs/day: 0.50    Years: 42.00    Pack years: 21.00    Types: Cigarettes   Smokeless tobacco: Never  Vaping Use   Vaping Use: Former  Substance Use Topics   Alcohol use: No   Drug use: No     Family Hx: The patient's family history includes Atrial fibrillation in her sister and sister; CAD (age of onset: 65) in her father; Cancer in her maternal grandfather; Cancer (age of onset: 43) in her sister; Dementia (age  of onset: 1) in her mother; Lung cancer (age of onset: 76) in her brother; Premature CHD in her son.  ROS:   Please see the history of present illness.    (+) Rhinorrhea (+) Chronic foot pain (+) Arthralgias (+) Sputum production All other systems reviewed and are negative.   Prior CV studies:   The following studies were reviewed today:  CT Chest 11/02/2020: FINDINGS: Cardiovascular: Normal heart size. No pericardial effusion. Three-vessel coronary artery calcifications. Mitral annular calcifications. Atherosclerotic disease thoracic aorta.   Mediastinum/Nodes: Thyroid and esophagus are unremarkable. No pathologically enlarged lymph nodes in the chest.   Lungs/Pleura: Central airways are patent. No consolidation, pleural effusion or pneumothorax. Scattered solid pulmonary nodules. Largest is located in the left lower lobe and measures 1.0 cm on series 3, image 176.   Upper  Abdomen: Low-density left adrenal nodule is unchanged compared to prior exam, likely a benign adenoma. No acute findings.   Musculoskeletal: No chest wall mass or suspicious bone lesions identified.   IMPRESSION: Lung-RADS 2, benign appearance or behavior. Continue annual screening with low-dose chest CT without contrast in 12 months.   Coronary artery calcifications, aortic Atherosclerosis (ICD10-I70.0) and Emphysema (ICD10-J43.9).  Echo 04/29/20  1. Left ventricular ejection fraction, by estimation, is 50 to 55%. The  left ventricle has low normal function. The left ventricle demonstrates  regional wall motion abnormalities (see scoring diagram/findings for  description). Akinesis of apex. There is   mild left ventricular hypertrophy. Left ventricular diastolic parameters  are indeterminate.   2. Right ventricular systolic function is normal. The right ventricular  size is normal. Tricuspid regurgitation signal is inadequate for assessing  PA pressure.   3. The mitral valve is abnormal. Trivial  mitral valve regurgitation. No  evidence of mitral stenosis. Moderate mitral annular calcification.   4. The aortic valve is tricuspid. Aortic valve regurgitation is not  visualized. Mild aortic valve sclerosis is present, with no evidence of  aortic valve stenosis.   5. Aortic dilatation noted. There is mild dilatation of the ascending  aorta, measuring 38 mm.   6. The inferior vena cava is normal in size with greater than 50%  respiratory variability, suggesting right atrial pressure of 3 mmHg.   Cath/PCI 04/28/20 Dist Cx lesion is 30% stenosed. 1st Mrg lesion is 20% stenosed. Prox RCA lesion is 10% stenosed. Dist LAD lesion is 100% stenosed. Post intervention, there is a 0% residual stenosis. Prox LAD to Mid LAD lesion is 80% stenosed. The left ventricular systolic function is normal. LV end diastolic pressure is normal. The left ventricular ejection fraction is greater than 65% by visual estimate.   Acute ST segment elevation myocardial infarction secondary to thrombotic subtotal occlusion within the previously placed proximal LAD stent with probable apical distal embolization.   Mild concomitant nonobstructive CAD involving the left circumflex 20% mild narrowings in the OM1 vessel and AV groove and mild irregularity in the mid RCA.   Successful PCI to the thrombotic lesion in the previously placed Xience 3.5 x 15 mm stent treated with pliant balloon dilatation up to 3.5 mm.  Residual stenosis 0 with brisk TIMI-3 flow.  However there is still probable abrupt cut off at the very apex of the LAD due to initial thrombus embolization   Hyperdynamic LV function with EF estimate greater than 65% without focal wall motion abnormality. LVEDP 12 mm.   RECOMMENDATION: DAPT indefinitely in this patient who had previously undergone stenting of her proximal LAD with subsequent Plavix discontinuation.  Continue Aggrastat 18 hours post procedure.  Aggressive lipid-lowering therapy and optimal blood  pressure control.  Smoking cessation is essential.  Echo 03/13/19  1. Left ventricular ejection fraction, by visual estimation, is 65 to 70%. The left ventricle has hyperdynamic function. There is no left ventricular hypertrophy.  2. Left ventricular diastolic parameters are consistent with Grade I diastolic dysfunction (impaired relaxation).  3. The left ventricle has no regional wall motion abnormalities.  4. Global right ventricle has normal systolic function.The right ventricular size is normal. No increase in right ventricular wall thickness.  5. Left atrial size was normal.  6. Right atrial size was normal.  7. The mitral valve is normal in structure. Trivial mitral valve regurgitation. No evidence of mitral stenosis.  8. The tricuspid valve is normal in structure.  9. The aortic valve  is normal in structure. Aortic valve regurgitation is not visualized. No evidence of aortic valve sclerosis or stenosis. 10. The pulmonic valve was normal in structure. Pulmonic valve regurgitation is not visualized. 11. Normal pulmonary artery systolic pressure. 12. The inferior vena cava is normal in size with greater than 50% respiratory variability, suggesting right atrial pressure of 3 mmHg.  Labs/Other Tests and Data Reviewed:    EKG:  ECG personally reviewed 07/26/2021: NSR at 75 bpm, PRWP 05/03/20: sinus bradycardia at 56 bpm, PRWP  Recent Labs: No results found for requested labs within last 8760 hours.   Recent Lipid Panel Lab Results  Component Value Date/Time   CHOL 203 (H) 04/28/2020 07:01 AM   CHOL 166 09/03/2018 12:03 PM   TRIG 165 (H) 04/28/2020 07:01 AM   HDL 52 04/28/2020 07:01 AM   HDL 51 09/03/2018 12:03 PM   CHOLHDL 3.9 04/28/2020 07:01 AM   LDLCALC 118 (H) 04/28/2020 07:01 AM   LDLCALC 64 09/03/2018 12:03 PM    Wt Readings from Last 3 Encounters:  07/26/21 187 lb 9.6 oz (85.1 kg)  11/14/20 188 lb 12.8 oz (85.6 kg)  08/09/20 196 lb 9.6 oz (89.2 kg)     Objective:     Vital Signs:  BP (!) 152/90 (BP Location: Right Arm, Patient Position: Sitting, Cuff Size: Normal)   Pulse 75   Ht _0  (1.651 m)   Wt 187 lb 9.6 oz (85.1 kg)   BMI 31.22 kg/m    GEN: Well nourished, well developed in no acute distress HEENT: Normal, moist mucous membranes NECK: No JVD CARDIAC: regular rhythm, normal S1 and S2, no rubs or gallops. 1/6  murmur. VASCULAR: Radial and DP pulses 2+ bilaterally. No carotid bruits RESPIRATORY:  Clear to auscultation without rales, wheezing or rhonchi  ABDOMEN: Soft, non-tender, non-distended MUSCULOSKELETAL:  Ambulates independently SKIN: Warm and dry, no edema NEUROLOGIC:  Alert and oriented x 3. No focal neuro deficits noted. PSYCHIATRIC:  Normal affect    ASSESSMENT & PLAN:    Essential hypertension - Plan: EKG 12-Lead  Medication management - Plan: Basic metabolic panel  Coronary artery disease involving native coronary artery of native heart without angina pectoris  History of ST elevation myocardial infarction (STEMI)  Hyperlipidemia LDL goal <70  Controlled type 2 diabetes mellitus without complication, without long-term current use of insulin (HCC)  Hypertension: goal of <130/80 -increasing lisinopril from 10 to 20 mg daily. If BP is not at goal, increase to 30 mg daily. Will recheck at follow up. Ordered BMET -continue metoprolol 25 mg BID given STEMI history  CAD with prior PCI for NSTEMI and then STEMI, aortic atherosclerosis: -continue aspirin 81 mg and ticagrelor 90 mg BID. Given in stent thrombosis more than 1 year since prior PCI, would continue DAPT indefinitely. If cost is an issue, may need to change to clopidogrel in the future.  -continue atorvastatin 40 mg -counseled on red flag warning signs that need immediate medical attention   Tobacco use: no longer smoking cigarettes, discussed avoiding vaping   HLD: goal LDL <70 -continue both atorvastatin and ezetimibe -LDL last  61 on 07/26/20  Type II  diabetes, with CAD history -tolerating Jardiance, metformin   Cardiac risk counseling and prevention recommendations: -recommend heart healthy/Mediterranean diet, with whole grains, fruits, vegetable, fish, lean meats, nuts, and olive oil. Limit salt. -recommend moderate walking, 3-5 times/week for 30-50 minutes each session. Aim for at least 150 minutes.week. Goal should be pace of 3 miles/hours, or walking 1.5 miles  in 30 minutes -recommend avoidance of tobacco products. Avoid excess alcohol.  Follow up: 2 mos or sooner as needed  Buford Dresser, MD, PhD, [place]  [address], [place], [place] [address] [phone]  Meds ordered this encounter  Medications   lisinopril (ZESTRIL) 20 MG tablet    Sig: Take 1 tablet (20 mg total) by mouth daily.    Dispense:  30 tablet    Refill:  11   Orders Placed This Encounter  Procedures   Basic metabolic panel   EKG [id number]   Patient Instructions  Medication Instructions:  Increase lisinopril to 20 mg daily. Please keep a log of your blood pressure. If blood pressure is consistently greater than 130/80, increase to 30 mg daily.   *If you need a refill on your cardiac medications before your next appointment, please call your pharmacy*   Lab Work: Your provider has recommended lab work in 2-3 weeks (BMP). Please have this collected at [facility] [place]. The lab is open 8:00 am - 4:30 pm. Please avoid 12:00p - 1:00p for lunch hour. You do not need an appointment. Please go to [address] [place], [place] [address]. This is in the Primary Care office on the 3rd floor, let them know you are there for blood work and they will direct you to the lab.   If you have labs (blood work) drawn today and your tests are completely normal, you will receive your results only by: [place] (if you have MyChart) OR A paper copy in the mail If you have any lab test that  is abnormal or we need to change your treatment, we will call you to review the results.   Testing/Procedures: None ordered today   Follow-Up: At [facility], you and your health needs are our priority.  As part of our continuing mission to provide you with exceptional heart care, we have created designated Provider Care Teams.  These Care Teams include your primary Cardiologist (physician) and Advanced Practice Providers (APPs -  Physician Assistants and Nurse Practitioners) who all work together to provide you with the care you need, when you need it.  We recommend signing up for the patient portal called "MyChart".  Sign up information is provided on this After Visit Summary.  MyChart is used to connect with patients for Virtual Visits (Telemedicine).  Patients are able to view lab/test results, encounter notes, upcoming appointments, etc.  Non-urgent messages can be sent to your provider as well.   To learn more about what you can do with MyChart, go to [email or web address].    Your next appointment:   2 month(s)  The format for your next appointment:   In Person  Provider:   Buford Dresser, MD{   Important Information About [place]        I,Mathew Stumpf,acting as a scribe for Buford Dresser, MD.,have documented all relevant documentation on the behalf of Buford Dresser, MD,as directed by  Buford Dresser, MD while in the presence of Buford Dresser, MD.  I, Buford Dresser, MD, have reviewed all documentation for this visit. The documentation on 07/26/21 for the exam, diagnosis, procedures, and orders are all accurate and complete.   Signed, Buford Dresser, MD  07/26/2021   [facility]

## 2021-08-07 ENCOUNTER — Ambulatory Visit (HOSPITAL_BASED_OUTPATIENT_CLINIC_OR_DEPARTMENT_OTHER): Payer: Medicare Other | Admitting: Family

## 2021-08-20 ENCOUNTER — Telehealth: Payer: Self-pay | Admitting: Cardiology

## 2021-08-20 NOTE — Telephone Encounter (Signed)
RN returned call to patient to discuss HTN.   Patient states she saw Dr. Harrell Gave about a month ago and states she has not seen in a change in her BP. Lisinopril was increased to '20mg'$ .   Patient states that it isn't always high but it still is fluctuating up and down. Patient could only give these pressures from today   Today (afternoon) 161/93 Today (morning) 163/98  Routing to Dr. Harrell Gave for review and recommendation.

## 2021-08-20 NOTE — Telephone Encounter (Signed)
Pt c/o BP issue: STAT if pt c/o blurred vision, one-sided weakness or slurred speech  1. What are your last 5 BP readings?  161/93 163/98  2. Are you having any other symptoms (ex. Dizziness, headache, blurred vision, passed out)? Woke up with a headache this morning.   3. What is your BP issue? Pt states she saw Dr Harrell Gave about a month ago and was given a higher dose bp med, but she states that it doesn't seem to be helping. Requesting call back.

## 2021-08-21 NOTE — Telephone Encounter (Signed)
Patient states she received a call from [place].

## 2021-08-21 NOTE — Telephone Encounter (Signed)
Patient is following up, again requesting advisement regarding BP medication.

## 2021-08-21 NOTE — Telephone Encounter (Signed)
Spoke with patient regarding blood pressures She is taking the Lisinopril 20 mg daily Metoprolol 25 mg twice a day, and Brilinta 90 mg daily   Blood pressure yesterday 160's/90's today 151/74   She takes her blood pressure in the morning prior to taking her medications and then an hour or more afterwards, no change  Blood pressure since follow up running 150's-160's/70's-90's  Discussed follow up labs with patient and reminded her needed to be done.   Will forward to Dr Harrell Gave and Sherian Rein D for review

## 2021-08-22 NOTE — Telephone Encounter (Signed)
Dr. Harrell Gave note suggested increasing lisinopril to 30 mg if no benefit at 20 mg.  Also, if she is smoking 30-45 minutes before checking BP, it will be elevated.  Increase lisinopril to 30 mg daily and have her check BP in the mornings, before first cigarette and breakfast.  Usually takes 5-7 days to see BP trend down after dose change.  Also if she can start exercise - walking - this will help.

## 2021-08-22 NOTE — Telephone Encounter (Signed)
Advised patient, verbalized understanding She has the Lisinopril 10 mg tabs and 20 mg tabs  Will take these together and let us know how she is doing before getting Rx for 30 mg pills   Patient stated she quit smoking about a year ago

## 2021-08-23 ENCOUNTER — Telehealth: Payer: Self-pay | Admitting: Cardiology

## 2021-08-23 NOTE — Telephone Encounter (Signed)
Pt c/o medication issue:  1. Name of Medication: lisinopril (ZESTRIL) 10 MG tablet  2. How are you currently taking this medication (dosage and times per day)?  As written  3. Are you having a reaction (difficulty breathing--STAT)? no  4. What is your medication issue? Pt states that she took a full dose of this medication today and her pulse rate is going up down. She is concerned and would like a call back today 91/60

## 2021-08-23 NOTE — Telephone Encounter (Signed)
RN returned call to patient. Patient states that she took '30mg'$  of Lisinopril, about an hour after taking,her blood pressure and heart rate went down, patient isn't sure which. She states she got a little short of breath when she went outside to [facility]. Patient has been taking this medication for years. Patient was taking 10 then increased to '20mg'$ , and yesterday she was increased to '30mg'$ .   RN advised patient to continue taking her medication and take her blood pressure 1-2 hours after taking her medication, and call us on Monday or Tuesday with those numbers

## 2021-08-26 ENCOUNTER — Telehealth: Payer: Self-pay | Admitting: Cardiology

## 2021-08-26 NOTE — Telephone Encounter (Signed)
Pt would like nurse to give her a call back regarding BP reading from the weekend as requested. Please advise

## 2021-08-26 NOTE — Telephone Encounter (Signed)
-  Pt called to provide updated BP readings -Pt stated BP tends to run higher in the morning before taking medications   6/15-153/85 122/66 (after medication) 6/16-162/82 144/68 (after medication) 6/17-165/94  134/63 (after medication) 6/18-171/107 130/65 (after medication) 6/19-149/90  Will forward to MD for review

## 2021-09-06 NOTE — Telephone Encounter (Signed)
Are those second readings in the morning after medication or are those evening readings?

## 2021-09-06 NOTE — Telephone Encounter (Signed)
First reading in the morning and second reading is in the afternoon after taking medication

## 2021-09-18 ENCOUNTER — Other Ambulatory Visit: Payer: Self-pay | Admitting: Cardiology

## 2021-09-18 NOTE — Telephone Encounter (Signed)
Rx(s) sent to pharmacy electronically.  

## 2021-09-23 NOTE — Telephone Encounter (Signed)
The after medication readings are better--we will review her patterns at her visit in 2 days. It would be great if she can bring her BP log with her. Thanks.

## 2021-09-23 NOTE — Telephone Encounter (Signed)
Pt updated and verbalized understanding.  

## 2021-09-25 ENCOUNTER — Ambulatory Visit (HOSPITAL_BASED_OUTPATIENT_CLINIC_OR_DEPARTMENT_OTHER): Payer: Medicare Other | Admitting: Cardiology

## 2021-09-25 ENCOUNTER — Encounter (HOSPITAL_BASED_OUTPATIENT_CLINIC_OR_DEPARTMENT_OTHER): Payer: Self-pay | Admitting: Cardiology

## 2021-09-25 VITALS — BP 170/84 | HR 64 | Ht 65.0 in | Wt 187.0 lb

## 2021-09-25 DIAGNOSIS — I10 Essential (primary) hypertension: Secondary | ICD-10-CM | POA: Diagnosis not present

## 2021-09-25 DIAGNOSIS — E119 Type 2 diabetes mellitus without complications: Secondary | ICD-10-CM

## 2021-09-25 DIAGNOSIS — E785 Hyperlipidemia, unspecified: Secondary | ICD-10-CM

## 2021-09-25 DIAGNOSIS — Z79899 Other long term (current) drug therapy: Secondary | ICD-10-CM

## 2021-09-25 DIAGNOSIS — I252 Old myocardial infarction: Secondary | ICD-10-CM | POA: Diagnosis not present

## 2021-09-25 DIAGNOSIS — I2510 Atherosclerotic heart disease of native coronary artery without angina pectoris: Secondary | ICD-10-CM | POA: Diagnosis not present

## 2021-09-25 MED ORDER — VALSARTAN 160 MG PO TABS: 160.0000 mg | ORAL_TABLET | Freq: Every day | ORAL | 3 refills | Status: DC

## 2021-09-25 NOTE — Progress Notes (Signed)
Cardiology Office Note:    Date:  09/25/2021   ID:  Kathryn Bruce, DOB June 23, 1945, MRN [id number]  PCP:  Maurice Small, MD  Cardiologist:  Buford Dresser, MD  Referring MD: Maurice Small, MD   Chief Complaint:  Follow up  History of Present Illness:    Kathryn Bruce is a 76 y.o. female with a history of prior COVID infection, CAD with NSTEMI in 09/2017 s/p DES to proximal LAD followed by STEMI 04/2020 due to in stent thrombosis, hypertension, hyperlipidemia, type 2 diabetes mellitus, obesity, and tobacco use. Presents here for follow up.  Today, she overall appears well. She brought her blood pressure log, and has had systolic pressures around the 130s. The highest blood pressure reading she had was 192/92 mmHg, but cannot recall what may have caused this. Reports she can feel when her BP changes, and describes it as her head "feeling fuzzy" (in the mornings). Of note, she sleeps frequently and feels like she has no energy. Is recovering from a bruise on the plantar region of her right foot.  The patient denies chest pain, shortness of breath, nocturnal dyspnea, orthopnea or peripheral edema.  There have been no palpitations, lightheadedness or syncope. Remains complaint with lisinopril without adverse effects at this time.  Past Medical History:  Diagnosis Date   Acute ST elevation myocardial infarction (STEMI) of anterior wall ([place]) 04/28/2020   Aortic atherosclerosis ([place]) 10/05/2017   Arthritis    "spine, knees" (10/06/2017)   CAD (coronary artery disease), native coronary artery 10/05/2017   10/06/2016 cardiac catheterization showing 99% LAD stenosis, 20 to 30% RCA and circumflex stenosis, 3.5 x 15 mm Sierra stent in proximal LAD by Dr. Angelena Form   [facility])    SKIN CANCDER REMOVED FROM NOSE    Chronic lower back pain    Diabetes mellitus type 2, noninsulin dependent ([place]) 10/05/2017   GERD (gastroesophageal reflux disease)    Hidradenitis    Hypercholesteremia     Hyperlipidemia 10/05/2017   Hypertension    Obesity (BMI 30-39.9) 10/05/2017   Osteoarthritis (arthritis due to wear and tear of joints)    right knee   Sleep apnea    STOP BANG SCORE 5   Tobacco use 09/05/2011   Type II diabetes mellitus ([place])    Past Surgical History:  Procedure Laterality Date   CORONARY ANGIOPLASTY WITH STENT PLACEMENT  10/06/2017   CORONARY STENT INTERVENTION N/A 10/06/2017   Procedure: CORONARY STENT INTERVENTION;  Surgeon: Burnell Blanks, MD;  Location: [place] [facility];  Service: Cardiovascular;  Laterality: N/A;   CORONARY/GRAFT ACUTE MI REVASCULARIZATION N/A 04/28/2020   Procedure: Coronary/Graft Acute MI Revascularization;  Surgeon: Troy Sine, MD;  Location: [place] [facility];  Service: Cardiovascular;  Laterality: N/A;   HERNIA REPAIR     LEFT HEART CATH AND CORONARY ANGIOGRAPHY N/A 10/06/2017   Procedure: LEFT HEART CATH AND CORONARY ANGIOGRAPHY;  Surgeon: Burnell Blanks, MD;  Location: [place] [facility];  Service: Cardiovascular;  Laterality: N/A;   LEFT HEART CATH AND CORONARY ANGIOGRAPHY N/A 04/28/2020   Procedure: LEFT HEART CATH AND CORONARY ANGIOGRAPHY;  Surgeon: Troy Sine, MD;  Location: [facility];  Service: Cardiovascular;  Laterality: N/A;   MULTIPLE TOOTH EXTRACTIONS  2017   TONSILLECTOMY  1963   VENTRAL HERNIA REPAIR  09/04/2011   Procedure: LAPAROSCOPIC VENTRAL HERNIA;  Surgeon: Gayland Curry, MD,FACS;  Location: [facility];  Service: General;  Laterality: N/A;  LAPAROSCOPIC incarcerated VENTRAL HERNIA  repair      No outpatient medications have been marked as taking for the 09/25/21 encounter (Appointment) with Buford Dresser, MD.     Allergies:   Patient has no known allergies.   Social History   Tobacco Use   Smoking status: Every Day    Packs/day: 0.50    Years: 42.00    Total pack years: 21.00    Types: Cigarettes   Smokeless tobacco: Never  Vaping Use   Vaping Use: Former  Substance Use  Topics   Alcohol use: No   Drug use: No     Family Hx: The patient's family history includes Atrial fibrillation in her sister and sister; CAD (age of onset: 28) in her father; Cancer in her maternal grandfather; Cancer (age of onset: 65) in her sister; Dementia (age of onset: 10) in her mother; Lung cancer (age of onset: 32) in her brother; Premature CHD in her son.  ROS:   Please see the history of present illness.    All other systems reviewed and are negative.   Prior CV studies:   The following studies were reviewed today:  CT Chest 11/02/2020: FINDINGS: Cardiovascular: Normal heart size. No pericardial effusion. Three-vessel coronary artery calcifications. Mitral annular calcifications. Atherosclerotic disease thoracic aorta.   Mediastinum/Nodes: Thyroid and esophagus are unremarkable. No pathologically enlarged lymph nodes in the chest.   Lungs/Pleura: Central airways are patent. No consolidation, pleural effusion or pneumothorax. Scattered solid pulmonary nodules. Largest is located in the left lower lobe and measures 1.0 cm on series 3, image 176.   Upper Abdomen: Low-density left adrenal nodule is unchanged compared to prior exam, likely a benign adenoma. No acute findings.   Musculoskeletal: No chest wall mass or suspicious bone lesions identified.   IMPRESSION: Lung-RADS 2, benign appearance or behavior. Continue annual screening with low-dose chest CT without contrast in 12 months.   Coronary artery calcifications, aortic Atherosclerosis (ICD10-I70.0) and Emphysema (ICD10-J43.9).  Echo 04/29/20  1. Left ventricular ejection fraction, by estimation, is 50 to 55%. The  left ventricle has low normal function. The left ventricle demonstrates  regional wall motion abnormalities (see scoring diagram/findings for  description). Akinesis of apex. There is   mild left ventricular hypertrophy. Left ventricular diastolic parameters  are indeterminate.   2. Right  ventricular systolic function is normal. The right ventricular  size is normal. Tricuspid regurgitation signal is inadequate for assessing  PA pressure.   3. The mitral valve is abnormal. Trivial mitral valve regurgitation. No  evidence of mitral stenosis. Moderate mitral annular calcification.   4. The aortic valve is tricuspid. Aortic valve regurgitation is not  visualized. Mild aortic valve sclerosis is present, with no evidence of  aortic valve stenosis.   5. Aortic dilatation noted. There is mild dilatation of the ascending  aorta, measuring 38 mm.   6. The inferior vena cava is normal in size with greater than 50%  respiratory variability, suggesting right atrial pressure of 3 mmHg.   Cath/PCI 04/28/20 Dist Cx lesion is 30% stenosed. 1st Mrg lesion is 20% stenosed. Prox RCA lesion is 10% stenosed. Dist LAD lesion is 100% stenosed. Post intervention, there is a 0% residual stenosis. Prox LAD to Mid LAD lesion is 80% stenosed. The left ventricular systolic function is normal. LV end diastolic pressure is normal. The left ventricular ejection fraction is greater than 65% by visual estimate.   Acute ST segment elevation myocardial infarction secondary to thrombotic subtotal occlusion within the previously placed proximal LAD stent with  probable apical distal embolization.   Mild concomitant nonobstructive CAD involving the left circumflex 20% mild narrowings in the OM1 vessel and AV groove and mild irregularity in the mid RCA.   Successful PCI to the thrombotic lesion in the previously placed Xience 3.5 x 15 mm stent treated with pliant balloon dilatation up to 3.5 mm.  Residual stenosis 0 with brisk TIMI-3 flow.  However there is still probable abrupt cut off at the very apex of the LAD due to initial thrombus embolization   Hyperdynamic LV function with EF estimate greater than 65% without focal wall motion abnormality. LVEDP 12 mm.   RECOMMENDATION: DAPT indefinitely in this  patient who had previously undergone stenting of her proximal LAD with subsequent Plavix discontinuation.  Continue Aggrastat 18 hours post procedure.  Aggressive lipid-lowering therapy and optimal blood pressure control.  Smoking cessation is essential.  Echo 03/13/19  1. Left ventricular ejection fraction, by visual estimation, is 65 to 70%. The left ventricle has hyperdynamic function. There is no left ventricular hypertrophy.  2. Left ventricular diastolic parameters are consistent with Grade I diastolic dysfunction (impaired relaxation).  3. The left ventricle has no regional wall motion abnormalities.  4. Global right ventricle has normal systolic function.The right ventricular size is normal. No increase in right ventricular wall thickness.  5. Left atrial size was normal.  6. Right atrial size was normal.  7. The mitral valve is normal in structure. Trivial mitral valve regurgitation. No evidence of mitral stenosis.  8. The tricuspid valve is normal in structure.  9. The aortic valve is normal in structure. Aortic valve regurgitation is not visualized. No evidence of aortic valve sclerosis or stenosis. 10. The pulmonic valve was normal in structure. Pulmonic valve regurgitation is not visualized. 11. Normal pulmonary artery systolic pressure. 12. The inferior vena cava is normal in size with greater than 50% respiratory variability, suggesting right atrial pressure of 3 mmHg.  Labs/Other Tests and Data Reviewed:    EKG:  ECG personally reviewed 09/25/2021 not ordered today 07/26/2021: NSR at 75 bpm, PRWP 05/03/20: sinus bradycardia at 56 bpm, PRWP  Recent Labs: No results found for requested labs within last 365 days.   Recent Lipid Panel Lab Results  Component Value Date/Time   CHOL 203 (H) 04/28/2020 07:01 AM   CHOL 166 09/03/2018 12:03 PM   TRIG 165 (H) 04/28/2020 07:01 AM   HDL 52 04/28/2020 07:01 AM   HDL 51 09/03/2018 12:03 PM   CHOLHDL 3.9 04/28/2020 07:01 AM   LDLCALC  118 (H) 04/28/2020 07:01 AM   LDLCALC 64 09/03/2018 12:03 PM    Wt Readings from Last 3 Encounters:  07/26/21 187 lb 9.6 oz (85.1 kg)  11/14/20 188 lb 12.8 oz (85.6 kg)  08/09/20 196 lb 9.6 oz (89.2 kg)     Objective:    Vital Signs:  BP (!) 170/84   Pulse 64   Ht '5\' 5"'$  (1.651 m)   Wt 187 lb (84.8 kg)   BMI 31.12 kg/m    GEN: Well nourished, well developed in no acute distress HEENT: Normal, moist mucous membranes NECK: No JVD CARDIAC: regular rhythm, normal S1 and S2, no rubs or gallops. 1/6  murmur. VASCULAR: Radial and DP pulses 2+ bilaterally. No carotid bruits RESPIRATORY:  Clear to auscultation without rales, wheezing or rhonchi  ABDOMEN: Soft, non-tender, non-distended MUSCULOSKELETAL:  Ambulates independently SKIN: Warm and dry, no edema NEUROLOGIC:  Alert and oriented x 3. No focal neuro deficits noted. PSYCHIATRIC:  Normal affect  ASSESSMENT & PLAN:    Essential hypertension - Plan: DISCONTINUED: valsartan (DIOVAN) 160 MG tablet  Medication management - Plan: Basic metabolic panel  Coronary artery disease involving native coronary artery of native heart without angina pectoris  History of ST elevation myocardial infarction (STEMI)  Controlled type 2 diabetes mellitus without complication, without long-term current use of insulin (HCC)  Hyperlipidemia LDL goal <70  Hypertension: not at goal of <130/80 -changing lisinopril to valsartan 160 mg daily. May need to increase on follow up if not at goal. Check BMET at follow up -continue metoprolol 25 mg BID given STEMI history, though could consider stopping in the future if she remains without angina. Alternatively, if BP control needed, could change to carvedilol  CAD with prior PCI for NSTEMI and then STEMI, aortic atherosclerosis: -continue aspirin 81 mg and ticagrelor 90 mg BID. Given in stent thrombosis more than 1 year since prior PCI, would continue DAPT indefinitely. If cost is an issue, may need to  change to clopidogrel in the future.  -continue atorvastatin 80 mg -counseled on red flag warning signs that need immediate medical attention   Tobacco use: no longer smoking cigarettes, discussed avoiding vaping   HLD: goal LDL <70 -continue both atorvastatin and ezetimibe -LDL last  61 on 07/26/20  Type II diabetes, with CAD history -tolerating Jardiance, metformin   Cardiac risk counseling and prevention recommendations: -recommend heart healthy/Mediterranean diet, with whole grains, fruits, vegetable, fish, lean meats, nuts, and olive oil. Limit salt. -recommend moderate walking, 3-5 times/week for 30-50 minutes each session. Aim for at least 150 minutes.week. Goal should be pace of 3 miles/hours, or walking 1.5 miles in 30 minutes -recommend avoidance of tobacco products. Avoid excess alcohol.  Follow up: 3 weeks or sooner as needed  Buford Dresser, MD, PhD, [place]  [address] [place], [place] [address] [phone]  Meds ordered this encounter  Medications   DISCONTD: valsartan (DIOVAN) 160 MG tablet    Sig: Take 1 tablet (160 mg total) by mouth daily.    Dispense:  90 tablet    Refill:  3    Replaces lisinopril   Orders Placed This Encounter  Procedures   Basic metabolic panel   Patient Instructions  Medication Instructions:  STOP: Lisinopril 10 mg START: Valsartan 160 mg daily  *If you need a refill on your cardiac medications before your next appointment, please call your pharmacy*   Lab Work: Your provider has recommended lab work in 3 weeks (BMP). Please have this collected at [facility] at [place]. The lab is open 8:00 am - 4:30 pm. Please avoid 12:00p - 1:00p for lunch hour. You do not need an appointment. Please go to [address] [place], [place] [address]. This is in the Primary Care office on the 3rd floor, let them know you are there for blood work and they will direct you to  the lab.  If you have labs (blood work) drawn today and your tests are completely normal, you will receive your results only by: [place] (if you have MyChart) OR A paper copy in the mail If you have any lab test that is abnormal or we need to change your treatment, we will call you to review the results.   Testing/Procedures: None ordered today   Follow-Up: At [facility], you and your health needs are our priority.  As part of our continuing mission to provide you with exceptional heart care, we  have created designated Provider Care Teams.  These Care Teams include your primary Cardiologist (physician) and Advanced Practice Providers (APPs -  Physician Assistants and Nurse Practitioners) who all work together to provide you with the care you need, when you need it.  We recommend signing up for the patient portal called "MyChart".  Sign up information is provided on this After Visit Summary.  MyChart is used to connect with patients for Virtual Visits (Telemedicine).  Patients are able to view lab/test results, encounter notes, upcoming appointments, etc.  Non-urgent messages can be sent to your provider as well.   To learn more about what you can do with MyChart, go to [email or web address].    Your next appointment:   3 week(s)  The format for your next appointment:   In Person  Provider: Nurse visit for blood pressure check  Your physician recommends that you schedule a follow-up appointment in 3 months with Dr. Cynda Acres as a scribe for Buford Dresser, MD.,have documented all relevant documentation on the behalf of Buford Dresser, MD,as directed by  Buford Dresser, MD while in the presence of Buford Dresser, MD.   I, Buford Dresser, MD, have reviewed all documentation for this visit. The documentation on 11/03/21 for the exam, diagnosis, procedures, and orders are all accurate and complete.

## 2021-09-25 NOTE — Patient Instructions (Signed)
Medication Instructions:  STOP: Lisinopril 10 mg START: Valsartan 160 mg daily  *If you need a refill on your cardiac medications before your next appointment, please call your pharmacy*   Lab Work: Your provider has recommended lab work in 3 weeks (BMP). Please have this collected at [facility] at [place]. The lab is open 8:00 am - 4:30 pm. Please avoid 12:00p - 1:00p for lunch hour. You do not need an appointment. Please go to [address] [place], [place] [address]. This is in the Primary Care office on the 3rd floor, let them know you are there for blood work and they will direct you to the lab.  If you have labs (blood work) drawn today and your tests are completely normal, you will receive your results only by: [place] (if you have MyChart) OR A paper copy in the mail If you have any lab test that is abnormal or we need to change your treatment, we will call you to review the results.   Testing/Procedures: None ordered today   Follow-Up: At [facility], you and your health needs are our priority.  As part of our continuing mission to provide you with exceptional heart care, we have created designated Provider Care Teams.  These Care Teams include your primary Cardiologist (physician) and Advanced Practice Providers (APPs -  Physician Assistants and Nurse Practitioners) who all work together to provide you with the care you need, when you need it.  We recommend signing up for the patient portal called "MyChart".  Sign up information is provided on this After Visit Summary.  MyChart is used to connect with patients for Virtual Visits (Telemedicine).  Patients are able to view lab/test results, encounter notes, upcoming appointments, etc.  Non-urgent messages can be sent to your provider as well.   To learn more about what you can do with MyChart, go to [email or web address].    Your next appointment:   3 week(s)  The format for your next  appointment:   In Person  Provider: Nurse visit for blood pressure check  Your physician recommends that you schedule a follow-up appointment in 3 months with Dr. Harrell Gave

## 2021-10-21 ENCOUNTER — Ambulatory Visit (INDEPENDENT_AMBULATORY_CARE_PROVIDER_SITE_OTHER): Payer: Medicare Other

## 2021-10-21 DIAGNOSIS — I10 Essential (primary) hypertension: Secondary | ICD-10-CM | POA: Diagnosis not present

## 2021-10-21 DIAGNOSIS — E1142 Type 2 diabetes mellitus with diabetic polyneuropathy: Secondary | ICD-10-CM | POA: Diagnosis not present

## 2021-10-21 DIAGNOSIS — Z79899 Other long term (current) drug therapy: Secondary | ICD-10-CM | POA: Diagnosis not present

## 2021-10-21 DIAGNOSIS — Z0000 Encounter for general adult medical examination without abnormal findings: Secondary | ICD-10-CM | POA: Diagnosis not present

## 2021-10-21 MED ORDER — VALSARTAN 320 MG PO TABS: 320.0000 mg | ORAL_TABLET | Freq: Every day | ORAL | 3 refills | Status: DC

## 2021-10-21 NOTE — Patient Instructions (Signed)
Medication Instructions:  INCREASE: Valsartan to 320 mg daily  *If you need a refill on your cardiac medications before your next appointment, please call your pharmacy*   Lab Work: Your provider has recommended lab work in 2 weeks (BMP). Please have this collected at [facility] at [place]. The lab is open 8:00 am - 4:30 pm. Please avoid 12:00p - 1:00p for lunch hour. You do not need an appointment. Please go to [address] [place], [place] [address]. This is in the Primary Care office on the 3rd floor, let them know you are there for blood work and they will direct you to the lab.  If you have labs (blood work) drawn today and your tests are completely normal, you will receive your results only by: [place] (if you have MyChart) OR A paper copy in the mail If you have any lab test that is abnormal or we need to change your treatment, we will call you to review the results.   Testing/Procedures: None ordered today   Follow-Up: At [facility], you and your health needs are our priority.  As part of our continuing mission to provide you with exceptional heart care, we have created designated Provider Care Teams.  These Care Teams include your primary Cardiologist (physician) and Advanced Practice Providers (APPs -  Physician Assistants and Nurse Practitioners) who all work together to provide you with the care you need, when you need it.  We recommend signing up for the patient portal called "MyChart".  Sign up information is provided on this After Visit Summary.  MyChart is used to connect with patients for Virtual Visits (Telemedicine).  Patients are able to view lab/test results, encounter notes, upcoming appointments, etc.  Non-urgent messages can be sent to your provider as well.   To learn more about what you can do with MyChart, go to [email or web address].    Your next appointment:   December 30, 2021 @ 11:20   The format for your next  appointment:   In Person  Provider:   Buford Dresser, MD

## 2021-10-21 NOTE — Progress Notes (Signed)
   Nurse Visit   Date of Encounter: 10/21/2021 ID: ROSAMAE ROCQUE, DOB 1946/01/03, MRN [id number]  PCP:  Maurice Small, MD   [facility] Providers Cardiologist:  Buford Dresser, MD      Visit Details   VS:  BP (!) 164/90   Pulse 91   Ht '5\' 6"'$  (1.676 m)   Wt 186 lb 12.8 oz (84.7 kg)   SpO2 94%   BMI 30.15 kg/m  , BMI Body mass index is 30.15 kg/m.  Wt Readings from Last 3 Encounters:  10/21/21 186 lb 12.8 oz (84.7 kg)  09/25/21 187 lb (84.8 kg)  07/26/21 187 lb 9.6 oz (85.1 kg)     Reason for visit: BP check Performed today: Vitals, Provider consulted:Dr. Harrell Gave, and Education Changes (medications, testing, etc.) : Increase Valsartan to 320 mg daily and check BMP in 2 weeks Length of Visit: 15 minutes    Medications Adjustments/Labs and Tests Ordered: No orders of the defined types were placed in this encounter.  Meds ordered this encounter  Medications   valsartan (DIOVAN) 320 MG tablet    Sig: Take 1 tablet (320 mg total) by mouth daily.    Dispense:  90 tablet    Refill:  3    Please wait to fill until pt call     Signed, Meryl Crutch, RN  10/21/2021 3:34 PM

## 2021-11-01 DIAGNOSIS — H35363 Drusen (degenerative) of macula, bilateral: Secondary | ICD-10-CM | POA: Diagnosis not present

## 2021-11-01 DIAGNOSIS — E119 Type 2 diabetes mellitus without complications: Secondary | ICD-10-CM | POA: Diagnosis not present

## 2021-11-01 DIAGNOSIS — H25813 Combined forms of age-related cataract, bilateral: Secondary | ICD-10-CM | POA: Diagnosis not present

## 2021-11-01 DIAGNOSIS — H353131 Nonexudative age-related macular degeneration, bilateral, early dry stage: Secondary | ICD-10-CM | POA: Diagnosis not present

## 2021-11-03 ENCOUNTER — Encounter (HOSPITAL_BASED_OUTPATIENT_CLINIC_OR_DEPARTMENT_OTHER): Payer: Self-pay | Admitting: Cardiology

## 2021-11-04 ENCOUNTER — Ambulatory Visit (HOSPITAL_BASED_OUTPATIENT_CLINIC_OR_DEPARTMENT_OTHER): Admission: RE | Admit: 2021-11-04 | Payer: Medicare Other | Source: Ambulatory Visit

## 2021-11-04 ENCOUNTER — Emergency Department (HOSPITAL_BASED_OUTPATIENT_CLINIC_OR_DEPARTMENT_OTHER): Payer: Medicare Other

## 2021-11-04 ENCOUNTER — Encounter (HOSPITAL_BASED_OUTPATIENT_CLINIC_OR_DEPARTMENT_OTHER): Payer: Self-pay | Admitting: Emergency Medicine

## 2021-11-04 ENCOUNTER — Emergency Department (HOSPITAL_BASED_OUTPATIENT_CLINIC_OR_DEPARTMENT_OTHER)
Admission: EM | Admit: 2021-11-04 | Discharge: 2021-11-04 | Disposition: A | Discharge status: Home / Self Care | Payer: Medicare Other | Attending: Emergency Medicine | Admitting: Emergency Medicine

## 2021-11-04 ENCOUNTER — Other Ambulatory Visit: Payer: Self-pay

## 2021-11-04 DIAGNOSIS — Z7984 Long term (current) use of oral hypoglycemic drugs: Secondary | ICD-10-CM | POA: Insufficient documentation

## 2021-11-04 DIAGNOSIS — I480 Paroxysmal atrial fibrillation: Secondary | ICD-10-CM | POA: Insufficient documentation

## 2021-11-04 DIAGNOSIS — I10 Essential (primary) hypertension: Secondary | ICD-10-CM | POA: Insufficient documentation

## 2021-11-04 DIAGNOSIS — R002 Palpitations: Secondary | ICD-10-CM | POA: Diagnosis not present

## 2021-11-04 DIAGNOSIS — J449 Chronic obstructive pulmonary disease, unspecified: Secondary | ICD-10-CM | POA: Diagnosis not present

## 2021-11-04 DIAGNOSIS — I2510 Atherosclerotic heart disease of native coronary artery without angina pectoris: Secondary | ICD-10-CM | POA: Insufficient documentation

## 2021-11-04 DIAGNOSIS — Z859 Personal history of malignant neoplasm, unspecified: Secondary | ICD-10-CM | POA: Diagnosis not present

## 2021-11-04 DIAGNOSIS — Z87891 Personal history of nicotine dependence: Secondary | ICD-10-CM | POA: Insufficient documentation

## 2021-11-04 DIAGNOSIS — E119 Type 2 diabetes mellitus without complications: Secondary | ICD-10-CM | POA: Diagnosis not present

## 2021-11-04 DIAGNOSIS — R000 Tachycardia, unspecified: Secondary | ICD-10-CM | POA: Diagnosis not present

## 2021-11-04 DIAGNOSIS — I4891 Unspecified atrial fibrillation: Secondary | ICD-10-CM | POA: Diagnosis not present

## 2021-11-04 DIAGNOSIS — Z79899 Other long term (current) drug therapy: Secondary | ICD-10-CM | POA: Insufficient documentation

## 2021-11-04 DIAGNOSIS — Z7982 Long term (current) use of aspirin: Secondary | ICD-10-CM | POA: Diagnosis not present

## 2021-11-04 LAB — BASIC METABOLIC PANEL
Anion gap: 14 (ref 5–15)
BUN: 20 mg/dL (ref 8–23)
CO2: 24 mmol/L (ref 22–32)
Calcium: 9.3 mg/dL (ref 8.9–10.3)
Chloride: 103 mmol/L (ref 98–111)
Creatinine, Ser: 0.62 mg/dL (ref 0.44–1.00)
GFR, Estimated: >60 mL/min (ref >60)
Glucose, Bld: 205 mg/dL — ABNORMAL HIGH (ref 70–99)
Potassium: 3.3 mmol/L — ABNORMAL LOW (ref 3.5–5.1)
Sodium: 141 mmol/L (ref 135–145)

## 2021-11-04 LAB — CBC
HCT: 41.5 % (ref 36.0–46.0)
Hemoglobin: 12.7 g/dL (ref 12.0–15.0)
MCH: 23.9 pg — ABNORMAL LOW (ref 26.0–34.0)
MCHC: 30.6 g/dL (ref 30.0–36.0)
MCV: 78.2 fL — ABNORMAL LOW (ref 80.0–100.0)
Platelets: 498 10*3/uL — ABNORMAL HIGH (ref 150–400)
RBC: 5.31 MIL/uL — ABNORMAL HIGH (ref 3.87–5.11)
RDW: 18.3 % — ABNORMAL HIGH (ref 11.5–15.5)
WBC: 12.8 10*3/uL — ABNORMAL HIGH (ref 4.0–10.5)
nRBC: 0 % (ref 0.0–0.2)

## 2021-11-04 LAB — TROPONIN I (HIGH SENSITIVITY)
Troponin I (High Sensitivity): 12 ng/L (ref <18)
Troponin I (High Sensitivity): 25 ng/L — ABNORMAL HIGH (ref <18)
Troponin I (High Sensitivity): 68 ng/L — ABNORMAL HIGH (ref <18)
Troponin I (High Sensitivity): 93 ng/L — ABNORMAL HIGH (ref <18)

## 2021-11-04 LAB — MAGNESIUM: Magnesium: 1.7 mg/dL (ref 1.7–2.4)

## 2021-11-04 MED ORDER — APIXABAN 5 MG PO TABS: 5.0000 mg | ORAL_TABLET | Freq: Two times a day (BID) | ORAL | 0 refills | Status: DC

## 2021-11-04 NOTE — ED Triage Notes (Signed)
Pt via pov from home with palpitations x 1 hour. Pt began top feel like her heart was racing and she had diaphoresis, sob, and pain in her jaw, which has since resolved. Pt has cardiac hx; had MI in 2022 and 2018. Pt alert & oriented, nad noted.

## 2021-11-04 NOTE — ED Provider Notes (Signed)
[facility] EMERGENC[facility] DEPT Provider Note   CSN: [id number] Arrival date & time: 11/04/21  0216     History  Chief Complaint  Patient presents with   Palpitations    Kathryn Bruce is a 76 y.o. female.  HPI     This is a 76 year old female with a history of coronary artery disease, hypertension, diabetes, smoking history who presents with palpitations.  Patient reports that she woke up 1 hour ago with her heart racing.  She had some chest pressure with radiation into her jaw.  She also reports some shortness of breath.  Symptoms have since resolved.  She does have a significant cardiac history and had an STEMI in 2022.  She has no known history of atrial fibrillation.  Denies recent fevers or illnesses.  Has not had any leg swelling.  Chart reviewed.  Last cardiac catheterization was 04/28/2020 she had a stent placed.   Home Medications Prior to Admission medications   Medication Sig Start Date End Date Taking? Authorizing Provider  aspirin EC 81 MG EC tablet Take 1 tablet (81 mg total) by mouth daily. 10/07/17  Yes Jacolyn Reedy, MD  DULoxetine (CYMBALTA) 60 MG capsule Take 1 capsule by mouth daily. 01/18/20  Yes [provider]  empagliflozin (JARDIANCE) 10 MG TABS tablet Take 1 tablet (10 mg total) by mouth daily before breakfast. 11/29/20  Yes Buford Dresser, MD  ezetimibe (ZETIA) 10 MG tablet TAKE 1 TABLET(10 MG) BY MOUTH DAILY 09/18/21  Yes Buford Dresser, MD  metFORMIN (GLUCOPHAGE) 500 MG tablet Take 1,000 mg by mouth 2 (two) times daily with a meal.   Yes [provider]  metoprolol tartrate (LOPRESSOR) 25 MG tablet TAKE 1 TABLET(25 MG) BY MOUTH TWICE DAILY 10/22/20  Yes Kathyrn Drown D, NP  nitroGLYCERIN (NITROSTAT) 0.4 MG SL tablet Place 1 tablet (0.4 mg total) under the tongue every 5 (five) minutes x 3 doses as needed for chest pain. 11/29/19  Yes Buford Dresser, MD  omeprazole (PRILOSEC) 40 MG capsule Take 40 mg by  mouth daily. 08/24/17  Yes [provider]  ticagrelor (BRILINTA) 90 MG TABS tablet Take 1 tablet (90 mg total) by mouth 2 (two) times daily. 12/25/20  Yes Buford Dresser, MD  valsartan (DIOVAN) 320 MG tablet Take 1 tablet (320 mg total) by mouth daily. 10/21/21  Yes Buford Dresser, MD  acetaminophen (TYLENOL) 500 MG tablet Take 1,000 mg by mouth every 8 (eight) hours as needed (pain).     [provider]  atorvastatin (LIPITOR) 80 MG tablet TAKE 1 TABLET(80 MG) BY MOUTH DAILY 01/21/21   Buford Dresser, MD  blood glucose meter kit and supplies KIT Dispense based on patient and insurance preference. Use up to four times daily as directed. 05/17/20   Buford Dresser, MD      Allergies    Patient has no known allergies.    Review of Systems   Review of Systems  Respiratory:  Positive for chest tightness and shortness of breath.   Cardiovascular:  Positive for palpitations.  All other systems reviewed and are negative.   Physical Exam Updated Vital Signs BP 127/68   Pulse 83   Temp 97.7 F (36.5 C) (Oral)   Resp 19   Ht 1.664 m (5' 5.5")   Wt 84.4 kg   SpO2 98%   BMI 30.48 kg/m  Physical Exam Vitals and nursing note reviewed.  Constitutional:      Appearance: She is well-developed.     Comments: Chronically  ill-appearing but nontoxic  HENT:     Head: Normocephalic and atraumatic.     Mouth/Throat:     Mouth: Mucous membranes are moist.  Eyes:     Pupils: Pupils are equal, round, and reactive to light.  Cardiovascular:     Rate and Rhythm: Normal rate and regular rhythm.     Heart sounds: Normal heart sounds.  Pulmonary:     Effort: Pulmonary effort is normal. No respiratory distress.     Breath sounds: Wheezing present.     Comments: Occasional wheeze Abdominal:     Palpations: Abdomen is soft.  Musculoskeletal:     Cervical back: Neck supple.     Right lower leg: No edema.     Left lower leg: No edema.  Skin:     General: Skin is warm and dry.  Neurological:     Mental Status: She is alert and oriented to person, place, and time.  Psychiatric:        Mood and Affect: Mood normal.     ED Results / Procedures / Treatments   Labs (all labs ordered are listed, but only abnormal results are displayed) Labs Reviewed  BASIC METABOLIC PANEL - Abnormal; Notable for the following components:      Result Value   Potassium 3.3 (*)    Glucose, Bld 205 (*)    All other components within normal limits  CBC - Abnormal; Notable for the following components:   WBC 12.8 (*)    RBC 5.31 (*)    MCV 78.2 (*)    MCH 23.9 (*)    RDW 18.3 (*)    Platelets 498 (*)    All other components within normal limits  TROPONIN I (HIGH SENSITIVITY) - Abnormal; Notable for the following components:   Troponin I (High Sensitivity) 25 (*)    All other components within normal limits  MAGNESIUM  TROPONIN I (HIGH SENSITIVITY)    EKG EKG Interpretation  Date/Time:  Monday November 04 2021 02:34:34 EDT Ventricular Rate:  94 PR Interval:    QRS Duration: 91 QT Interval:  344 QTC Calculation: 431 R Axis:   -26 Text Interpretation: Atrial fibrillation Borderline left axis deviation Consider anterior infarct Atrial fibrillation, new Confirmed by Thayer Jew [phone]) on 11/04/2021 5:48:55 AM   EKG Interpretation  Date/Time:  Monday November 04 2021 02:49:07 EDT Ventricular Rate:  95 PR Interval:  194 QRS Duration: 98 QT Interval:  356 QTC Calculation: 448 R Axis:   -49 Text Interpretation: Sinus rhythm Inferior infarct, old Consider anterior infarct Confirmed by Thayer Jew [phone]) on 11/04/2021 5:49:28 AM        Radiology DG Chest 1 View  Result Date: 11/04/2021 CLINICAL DATA:  Shortness breath, heart palpitations tachycardia. EXAM: CHEST  1 VIEW COMPARISON:  Chest CT 11/02/2020, chest CT 10/07/2018 FINDINGS: The cardiomediastinal silhouette is stable with mild aortic tortuosity and calcification and no  vascular congestion. The lungs are clear of infiltrates with mild COPD change and again noted 1 cm nodule in the left lower lung field, stable back to 10/07/2018, on CT located in the left lower lobe. Continued annual screening is recommended. No pleural effusion is seen. There is mild eventration right hemidiaphragm. Osteopenia. IMPRESSION: 1. No acute chest findings.  COPD. 2. 1 cm nodule which demonstrates benign features and stability back to 10/07/2018. 3. Continued annual screening with low-dose chest CT without contrast recommended. Electronically Signed   By: Telford Nab M.D.   On: 11/04/2021 03:17  Procedures Procedures    Medications Ordered in ED Medications - No data to display  ED Course/ Medical Decision Making/ A&P Clinical Course as of 11/04/21 0549  Mon Nov 04, 2021  0540 Remains chest pain-free.  Repeat troponin is 25.  Suspect this may have been related to new onset atrial fibrillation given that she had palpitations prior to arrival.  She may have had an increased rate.  This was not called on her monitor.  Will discuss with cardiology given her history. [CH]  [phone] Spoke with Dr. Humphrey Rolls, cardiology.  Feels that slight bump in troponin likely related to episode of atrial fibrillation.  Request third troponin.  If stable, he feels she can follow-up as an outpatient as long as she remains asymptomatic. [CH]    Clinical Course User Index [CH] Ellias Mcelreath, Barbette Hair, MD                           Medical Decision Making Amount and/or Complexity of Data Reviewed Labs: ordered. Radiology: ordered.   This patient presents to the ED for concern of palpitations, chest pain, this involves an extensive number of treatment options, and is a complaint that carries with it a high risk of complications and morbidity.  I considered the following differential and admission for this acute, potentially life threatening condition.  The differential diagnosis includes arrhythmia, ACS, metabolic  derangements, tamponade, pericarditis  This patients CHA2DS2-VASc Score and unadjusted Ischemic Stroke Rate (% per year) is equal to 9.7 % stroke rate/year from a score of 6  Above score calculated as 1 point each if present [CHF, HTN, DM, Vascular=MI/PAD/Aortic Plaque, Age if 65-74, or Female] Above score calculated as 2 points each if present [Age > 75, or Stroke/TIA/TE]  MDM:    This is a 76 year old female with history of hypertension, diabetes, MI who presents with palpitations and chest discomfort.  Pain had completely resolved on my evaluation.  Her initial EKG indicated atrial fibrillation which would be new for her.  On my evaluation, she appears to be in sinus rhythm.  Repeat EKG confirms this.  She is completely asymptomatic.  Suspect she may have had a high rate prior to arrival.  EKG at the moment shows no evidence of ischemia.  Initial troponin is negative.  No significant metabolic derangements.  Repeat troponin bumped slightly to 25.  Given her cardiac history, I did discuss with cardiology.  See discussion above.  We will repeat.  If troponin is stable, will discharge home with close cardiology follow-up.  Ambulatory referral to atrial fibrillation clinic has been placed.  She will go home on a NOAC.  If troponin continues to rise, she will likely need admission for further cardiac evaluation inpatient.  (Labs, imaging, consults)  Labs: I Ordered, and personally interpreted labs.  The pertinent results include: CBC, BMP, troponin, magnesium  Imaging Studies ordered: I ordered imaging studies including chest x-ray without pneumothorax or pneumonia I independently visualized and interpreted imaging. I agree with the radiologist interpretation  Additional history obtained from chart review.  External records from outside source obtained and reviewed including cardiac catheterization  Cardiac Monitoring: The patient was maintained on a cardiac monitor.  I personally viewed and  interpreted the cardiac monitored which showed an underlying rhythm of: Normal sinus rhythm  Reevaluation: After the interventions noted above, I reevaluated the patient and found that they have :resolved  Social Determinants of Health: Former smoker, lives independently  Disposition: Pending  Co morbidities that complicate the patient evaluation  Past Medical History:  Diagnosis Date   Acute ST elevation myocardial infarction (STEMI) of anterior wall ([place]) 04/28/2020   Aortic atherosclerosis ([place]) 10/05/2017   Arthritis    "spine, knees" (10/06/2017)   CAD (coronary artery disease), native coronary artery 10/05/2017   10/06/2016 cardiac catheterization showing 99% LAD stenosis, 20 to 30% RCA and circumflex stenosis, 3.5 x 15 mm [place] stent in proximal LAD by Dr. Angelena Form   Cancer [facility])    SKIN CANCDER REMOVED FROM NOSE    Chronic lower back pain    Diabetes mellitus type 2, noninsulin dependent ([place]) 10/05/2017   GERD (gastroesophageal reflux disease)    Hidradenitis    Hypercholesteremia    Hyperlipidemia 10/05/2017   Hypertension    Obesity (BMI 30-39.9) 10/05/2017   Osteoarthritis (arthritis due to wear and tear of joints)    right knee   Sleep apnea    STOP BANG SCORE 5   Tobacco use 09/05/2011   Type II diabetes mellitus ([place])      Medicines No orders of the defined types were placed in this encounter.   I have reviewed the patients home medicines and have made adjustments as needed  Problem List / ED Course: Problem List Items Addressed This Visit   None               Final Clinical Impression(s) / ED Diagnoses Final diagnoses:  None    Rx / DC Orders ED Discharge Orders          Ordered    Amb referral to AFIB Clinic        11/04/21 0242              Merryl Hacker, MD 11/04/21 [phone]

## 2021-11-04 NOTE — ED Provider Notes (Signed)
Discussed case with Dr. Harrell Gave, patient's cardiologist, about patient's increased troponin.  She feels this is likely demand ischemia and does not want any further work-up of this.  She has arranged a  follow-up appointment with the patient on the 30th.  Request that the patient be started on apixaban   Lacretia Leigh, MD 11/04/21 [phone]

## 2021-11-04 NOTE — Progress Notes (Signed)
Cardiology note:  Spoke with Dr. Zenia Resides. Ms. Daleo presented with afib RVR, now back in sinus rhythm. Troponin mildly elevated, but no chest pain. Suspect this is demand due to the RVR and that troponins will continue to rise for a time.   She is on aspirin and ticagrelor for history of in stent thrombosis (but >1 year since PCI). She will now need anticoagulation.  I spoke with Laurann Montana, NP who has graciously agreed to see her in 2 days as I am rounding in the hospital this week. Plan will be to start apixaban today, then in 2 days transition from aspirin and ticagrelor to clopidogrel (+ apixaban).  Should return to ER with any concerning symptoms. Monitor for bleeding. Continue metoprolol.  Buford Dresser, MD, PhD, [facility] at [facility] at [facility] [address], [place], [place] [address] [phone]

## 2021-11-04 NOTE — Discharge Instructions (Addendum)
You have an appointment to be seen by Dr. Harrell Gave on August 30 at 10:55 AM.  Call the office to confirm this

## 2021-11-04 NOTE — ED Notes (Signed)
O2 removed by ED provider at this time.

## 2021-11-04 NOTE — ED Notes (Signed)
Pt O2 decreased to 85% with good pleth wave. Pt placed on 2L O2

## 2021-11-04 NOTE — ED Notes (Signed)
Discharge instructions including follow up with cardiology and afib clinic reviewed and explained. Pt was provided with eliquis informational packet and teaching provided on how to take eliquis, the importance of continuing to take it as directed until further evaluated by cardiologist and risks of taking eliquis. Pt verbalized understanding and had no further questions on d/c. Pt caox4, ambulatory, and in NSR on d/c.

## 2021-11-06 ENCOUNTER — Encounter (HOSPITAL_BASED_OUTPATIENT_CLINIC_OR_DEPARTMENT_OTHER): Payer: Self-pay | Admitting: Family

## 2021-11-06 ENCOUNTER — Ambulatory Visit (HOSPITAL_BASED_OUTPATIENT_CLINIC_OR_DEPARTMENT_OTHER): Payer: Medicare Other | Admitting: Family

## 2021-11-06 VITALS — BP 130/86 | HR 72 | Ht 65.5 in | Wt 186.0 lb

## 2021-11-06 DIAGNOSIS — D6859 Other primary thrombophilia: Secondary | ICD-10-CM

## 2021-11-06 DIAGNOSIS — I480 Paroxysmal atrial fibrillation: Secondary | ICD-10-CM

## 2021-11-06 DIAGNOSIS — E785 Hyperlipidemia, unspecified: Secondary | ICD-10-CM

## 2021-11-06 DIAGNOSIS — I25118 Atherosclerotic heart disease of native coronary artery with other forms of angina pectoris: Secondary | ICD-10-CM | POA: Diagnosis not present

## 2021-11-06 DIAGNOSIS — I10 Essential (primary) hypertension: Secondary | ICD-10-CM

## 2021-11-06 MED ORDER — CLOPIDOGREL BISULFATE 75 MG PO TABS: 75.0000 mg | ORAL_TABLET | Freq: Every day | ORAL | 3 refills | Status: DC

## 2021-11-06 MED ORDER — METOPROLOL TARTRATE 25 MG PO TABS: 25.0000 mg | ORAL_TABLET | Freq: Two times a day (BID) | ORAL | 3 refills | Status: AC

## 2021-11-06 NOTE — Progress Notes (Signed)
Office Visit    Patient Name: Kathryn Bruce Date of Encounter: 11/07/2021  PCP:  Maurice Small, MD   [place]  Cardiologist:  Buford Dresser, MD  Advanced Practice Provider:  No care team member to display Electrophysiologist:  None      Chief Complaint    Kathryn Bruce is a 76 y.o. female presents today for follow-up after ED visit with atrial fibrillation  Past Medical History    Past Medical History:  Diagnosis Date   Acute ST elevation myocardial infarction (STEMI) of anterior wall ([place]) 04/28/2020   Aortic atherosclerosis ([place]) 10/05/2017   Arthritis    "spine, knees" (10/06/2017)   CAD (coronary artery disease), native coronary artery 10/05/2017   10/06/2016 cardiac catheterization showing 99% LAD stenosis, 20 to 30% RCA and circumflex stenosis, 3.5 x 15 mm Sierra stent in proximal LAD by Dr. Angelena Form   Cancer [facility])    SKIN CANCDER REMOVED FROM NOSE    Chronic lower back pain    Diabetes mellitus type 2, noninsulin dependent ([place]) 10/05/2017   GERD (gastroesophageal reflux disease)    Hidradenitis    Hypercholesteremia    Hyperlipidemia 10/05/2017   Hypertension    Obesity (BMI 30-39.9) 10/05/2017   Osteoarthritis (arthritis due to wear and tear of joints)    right knee   Sleep apnea    STOP BANG SCORE 5   Tobacco use 09/05/2011   Type II diabetes mellitus ([place])    Past Surgical History:  Procedure Laterality Date   CORONARY ANGIOPLASTY WITH STENT PLACEMENT  10/06/2017   CORONARY STENT INTERVENTION N/A 10/06/2017   Procedure: CORONARY STENT INTERVENTION;  Surgeon: Burnell Blanks, MD;  Location: [place] [facility];  Service: Cardiovascular;  Laterality: N/A;   CORONARY/GRAFT ACUTE MI REVASCULARIZATION N/A 04/28/2020   Procedure: Coronary/Graft Acute MI Revascularization;  Surgeon: Troy Sine, MD;  Location: [place] [facility];  Service: Cardiovascular;  Laterality: N/A;   HERNIA REPAIR     LEFT HEART CATH AND CORONARY  ANGIOGRAPHY N/A 10/06/2017   Procedure: LEFT HEART CATH AND CORONARY ANGIOGRAPHY;  Surgeon: Burnell Blanks, MD;  Location: [place] [facility];  Service: Cardiovascular;  Laterality: N/A;   LEFT HEART CATH AND CORONARY ANGIOGRAPHY N/A 04/28/2020   Procedure: LEFT HEART CATH AND CORONARY ANGIOGRAPHY;  Surgeon: Troy Sine, MD;  Location: [place] [facility];  Service: Cardiovascular;  Laterality: N/A;   MULTIPLE TOOTH EXTRACTIONS  2017   TONSILLECTOMY  1963   VENTRAL HERNIA REPAIR  09/04/2011   Procedure: LAPAROSCOPIC VENTRAL HERNIA;  Surgeon: Gayland Curry, MD,FACS;  Location: [facility];  Service: General;  Laterality: N/A;  LAPAROSCOPIC incarcerated VENTRAL HERNIA  repair     Allergies  No Known Allergies  History of Present Illness    NYKIAH MA is a 76 y.o. female with a hx of CAD s/p NSTEMI 09/2017 with DES-pLAD and STEMI 04/2020 with ISR, HTN, HLD, Dm2, PAF, obesity, tobacco use last seen 09/25/2021 by Dr. Harrell Gave.  Last seen 09/25/21 with elevated blood pressure and Lisinopril transitioned to Valsartan.   ED visit 11/04/21 after presenting with palpitations with sensation of heart racing. Also some chest pressure with radiation to her jaw. HS troponin 25 ? 68 ? 93. Elevated troponin though to be demands ischemia. Eliquis initiated.   Presents today for follow up independently. Reports no palpitations. She has not yet taken Eliquis as did not pick up from her pharmacy.  Reports no shortness of  breath nor dyspnea on exertion. Reports no chest pain, pressure, or tightness. No edema, orthopnea, PND. Reports no palpitations.  BP at home has been well controlled.   EKGs/Labs/Other Studies Reviewed:   The following studies were reviewed today: CT Chest 11/02/2020: FINDINGS: Cardiovascular: Normal heart size. No pericardial effusion. Three-vessel coronary artery calcifications. Mitral annular calcifications. Atherosclerotic disease thoracic aorta.   Mediastinum/Nodes: Thyroid  and esophagus are unremarkable. No pathologically enlarged lymph nodes in the chest.   Lungs/Pleura: Central airways are patent. No consolidation, pleural effusion or pneumothorax. Scattered solid pulmonary nodules. Largest is located in the left lower lobe and measures 1.0 cm on series 3, image 176.   Upper Abdomen: Low-density left adrenal nodule is unchanged compared to prior exam, likely a benign adenoma. No acute findings.   Musculoskeletal: No chest wall mass or suspicious bone lesions identified.   IMPRESSION: Lung-RADS 2, benign appearance or behavior. Continue annual screening with low-dose chest CT without contrast in 12 months.   Coronary artery calcifications, aortic Atherosclerosis (ICD10-I70.0) and Emphysema (ICD10-J43.9).   Echo 04/29/20  1. Left ventricular ejection fraction, by estimation, is 50 to 55%. The  left ventricle has low normal function. The left ventricle demonstrates  regional wall motion abnormalities (see scoring diagram/findings for  description). Akinesis of apex. There is   mild left ventricular hypertrophy. Left ventricular diastolic parameters  are indeterminate.   2. Right ventricular systolic function is normal. The right ventricular  size is normal. Tricuspid regurgitation signal is inadequate for assessing  PA pressure.   3. The mitral valve is abnormal. Trivial mitral valve regurgitation. No  evidence of mitral stenosis. Moderate mitral annular calcification.   4. The aortic valve is tricuspid. Aortic valve regurgitation is not  visualized. Mild aortic valve sclerosis is present, with no evidence of  aortic valve stenosis.   5. Aortic dilatation noted. There is mild dilatation of the ascending  aorta, measuring 38 mm.   6. The inferior vena cava is normal in size with greater than 50%  respiratory variability, suggesting right atrial pressure of 3 mmHg.    Cath/PCI 04/28/20 Dist Cx lesion is 30% stenosed. 1st Mrg lesion is 20%  stenosed. Prox RCA lesion is 10% stenosed. Dist LAD lesion is 100% stenosed. Post intervention, there is a 0% residual stenosis. Prox LAD to Mid LAD lesion is 80% stenosed. The left ventricular systolic function is normal. LV end diastolic pressure is normal. The left ventricular ejection fraction is greater than 65% by visual estimate.   Acute ST segment elevation myocardial infarction secondary to thrombotic subtotal occlusion within the previously placed proximal LAD stent with probable apical distal embolization.   Mild concomitant nonobstructive CAD involving the left circumflex 20% mild narrowings in the OM1 vessel and AV groove and mild irregularity in the mid RCA.   Successful PCI to the thrombotic lesion in the previously placed Xience 3.5 x 15 mm stent treated with pliant balloon dilatation up to 3.5 mm.  Residual stenosis 0 with brisk TIMI-3 flow.  However there is still probable abrupt cut off at the very apex of the LAD due to initial thrombus embolization   Hyperdynamic LV function with EF estimate greater than 65% without focal wall motion abnormality. LVEDP 12 mm.   RECOMMENDATION: DAPT indefinitely in this patient who had previously undergone stenting of her proximal LAD with subsequent Plavix discontinuation.  Continue Aggrastat 18 hours post procedure.  Aggressive lipid-lowering therapy and optimal blood pressure control.  Smoking cessation is essential.   Echo 03/13/19  1. Left ventricular ejection fraction, by visual estimation, is 65 to 70%. The left ventricle has hyperdynamic function. There is no left ventricular hypertrophy.  2. Left ventricular diastolic parameters are consistent with Grade I diastolic dysfunction (impaired relaxation).  3. The left ventricle has no regional wall motion abnormalities.  4. Global right ventricle has normal systolic function.The right ventricular size is normal. No increase in right ventricular wall thickness.  5. Left atrial size was  normal.  6. Right atrial size was normal.  7. The mitral valve is normal in structure. Trivial mitral valve regurgitation. No evidence of mitral stenosis.  8. The tricuspid valve is normal in structure.  9. The aortic valve is normal in structure. Aortic valve regurgitation is not visualized. No evidence of aortic valve sclerosis or stenosis. 10. The pulmonic valve was normal in structure. Pulmonic valve regurgitation is not visualized. 11. Normal pulmonary artery systolic pressure. 12. The inferior vena cava is normal in size with greater than 50% respiratory variability, suggesting right atrial pressure of 3 mmHg.  EKG:  EKG is ordered today.  The ekg ordered today demonstrates NSR 72 bpm with LAFB and no acute ST/T wave changes.  Recent Labs: 11/04/2021: BUN 20; Creatinine, Ser 0.62; Hemoglobin 12.7; Magnesium 1.7; Platelets 498; Potassium 3.3; Sodium 141  Recent Lipid Panel    Component Value Date/Time   CHOL 203 (H) 04/28/2020 0701   CHOL 166 09/03/2018 1203   TRIG 165 (H) 04/28/2020 0701   HDL 52 04/28/2020 0701   HDL 51 09/03/2018 1203   CHOLHDL 3.9 04/28/2020 0701   VLDL 33 04/28/2020 0701   LDLCALC 118 (H) 04/28/2020 0701   LDLCALC 64 09/03/2018 1203    Risk Assessment/Calculations:   CHA2DS2-VASc Score = 6   This indicates a 9.7% annual risk of stroke. The patient's score is based upon: CHF History: 0 HTN History: 1 Diabetes History: 1 Stroke History: 0 Vascular Disease History: 1 Age Score: 2 Gender Score: 1     Home Medications   Current Meds  Medication Sig   acetaminophen (TYLENOL) 500 MG tablet Take 1,000 mg by mouth every 8 (eight) hours as needed (pain).    atorvastatin (LIPITOR) 80 MG tablet TAKE 1 TABLET(80 MG) BY MOUTH DAILY   blood glucose meter kit and supplies KIT Dispense based on patient and insurance preference. Use up to four times daily as directed.   clopidogrel (PLAVIX) 75 MG tablet Take 1 tablet (75 mg total) by mouth daily.   DULoxetine  (CYMBALTA) 60 MG capsule Take 1 capsule by mouth daily.   ezetimibe (ZETIA) 10 MG tablet TAKE 1 TABLET(10 MG) BY MOUTH DAILY   JARDIANCE 25 MG TABS tablet Take 25 mg by mouth daily.   metFORMIN (GLUCOPHAGE) 500 MG tablet Take 1,000 mg by mouth 2 (two) times daily with a meal.   nitroGLYCERIN (NITROSTAT) 0.4 MG SL tablet Place 1 tablet (0.4 mg total) under the tongue every 5 (five) minutes x 3 doses as needed for chest pain.   omeprazole (PRILOSEC) 40 MG capsule Take 40 mg by mouth daily.   valsartan (DIOVAN) 320 MG tablet Take 1 tablet (320 mg total) by mouth daily.   [DISCONTINUED] aspirin EC 81 MG EC tablet Take 1 tablet (81 mg total) by mouth daily.   [DISCONTINUED] metoprolol tartrate (LOPRESSOR) 25 MG tablet TAKE 1 TABLET(25 MG) BY MOUTH TWICE DAILY   [DISCONTINUED] ticagrelor (BRILINTA) 90 MG TABS tablet Take 1 tablet (90 mg total) by mouth 2 (two) times daily.  Review of Systems      All other systems reviewed and are otherwise negative except as noted above.  Physical Exam    VS:  BP 130/86 (BP Location: Right Arm, Patient Position: Sitting, Cuff Size: Normal)   Pulse 72   Ht 5' 5.5" (1.664 m)   Wt 186 lb (84.4 kg)   SpO2 98%   BMI 30.48 kg/m  , BMI Body mass index is 30.48 kg/m.  Wt Readings from Last 3 Encounters:  11/06/21 186 lb (84.4 kg)  11/04/21 186 lb (84.4 kg)  10/21/21 186 lb 12.8 oz (84.7 kg)     GEN: Well nourished, well developed, in no acute distress. HEENT: normal. Neck: Supple, no JVD, carotid bruits, or masses. Cardiac: RRR, no murmurs, rubs, or gallops. No clubbing, cyanosis, edema.  Radials/PT 2+ and equal bilaterally.  Respiratory:  Respirations regular and unlabored, clear to auscultation bilaterally. GI: Soft, nontender, nondistended. MS: No deformity or atrophy. Skin: Warm and dry, no rash. Neuro:  Strength and sensation are intact. Psych: Normal affect.  Assessment & Plan    PAF / Hypercoagulable state - ED 11/04/21 with new onset atrial  fib. Eliquis initiated but she has not yet picked up. EKG today revels she self-converted to NSR. Continue Metoprolol 52m BID. Educated on importance of [place] atrial fib and stroke prevention. Eliquis samples provided in clinic, encouraged to pick up her Rx. Patient assistance paperwork provided which she will complete and return. To reduce bleeding risk, stop Aspirin/Brilinta and start Plavix 785mdaily. Update echo, thyroid panel to assess for etiology of atrial fib. CBC in 2 weeks for monitoring on Eliquis and Plavix.    CAD / HLD, LDL goal <70 - Stable with no anginal symptoms. No indication for ischemic evaluation.  Stop Brilinta and Aspirin, start Plavix as detailed above. GDMT Plavix, Metoprolol, Zetia, Atorvastatin. Heart healthy diet and regular cardiovascular exercise encouraged.    HTN - BP well controlled. Continue current antihypertensive regimen.    DM2 - Continue to follow with PCP.          Disposition: Follow up in 2 month(s) with BrBuford DresserMD or APP.  Signed, CaLoel DubonnetNP 11/07/2021, 9:55 AM [place]

## 2021-11-06 NOTE — Patient Instructions (Addendum)
Medication Instructions:  Your physician has recommended you make the following change in your medication:   STOP Brilinta  STOP Aspirin  START Clopidogrel (Plavix) one '75mg'$  tablet daily *this is to protect your stents. It replaces your aspirin and brilinta.  START Eliquis '5mg'$  twice daily *this is to help prevent stroke due to atrial fibrillation  *If you need a refill on your cardiac medications before your next appointment, please call your pharmacy*   Lab Work: Your physician recommends that you return for lab work in 2 weeks: CBC, thyroid panel  If you have labs (blood work) drawn today and your tests are completely normal, you will receive your results only by: [place] (if you have MyChart) OR A paper copy in the mail If you have any lab test that is abnormal or we need to change your treatment, we will call you to review the results.   Testing/Procedures: Your physician has requested that you have an echocardiogram. Echocardiography is a painless test that uses sound waves to create images of your heart. It provides your doctor with information about the size and shape of your heart and how well your heart's chambers and valves are working. This procedure takes approximately one hour. There are no restrictions for this procedure.    Follow-Up: At [facility], you and your health needs are our priority.  As part of our continuing mission to provide you with exceptional heart care, we have created designated Provider Care Teams.  These Care Teams include your primary Cardiologist (physician) and Advanced Practice Providers (APPs -  Physician Assistants and Nurse Practitioners) who all work together to provide you with the care you need, when you need it.  We recommend signing up for the patient portal called "MyChart".  Sign up information is provided on this After Visit Summary.  MyChart is used to connect with patients for Virtual Visits (Telemedicine).   Patients are able to view lab/test results, encounter notes, upcoming appointments, etc.  Non-urgent messages can be sent to your provider as well.   To learn more about what you can do with MyChart, go to [email or web address].    Your next appointment:   As scheduled with Dr. Harrell Gave  Other Instructions   Atrial Fibrillation  Atrial fibrillation is a type of heartbeat that is irregular or fast. If you have this condition, your heart beats without any order. This makes it hard for your heart to pump blood in a normal way. Atrial fibrillation may come and go, or it may become a long-lasting problem. If this condition is not treated, it can put you at higher risk for stroke, heart failure, and other heart problems. What are the causes? This condition may be caused by diseases that damage the heart. They include: High blood pressure. Heart failure. Heart valve disease. Heart surgery. Other causes include: Diabetes. Thyroid disease. Being overweight. Kidney disease. Sometimes the cause is not known. What increases the risk? You are more likely to develop this condition if: You are older. You smoke. You exercise often and very hard. You have a family history of this condition. You are a man. You use drugs. You drink a lot of alcohol. You have lung conditions, such as emphysema, pneumonia, or COPD. You have sleep apnea. What are the signs or symptoms? Common symptoms of this condition include: A feeling that your heart is beating very fast. Chest pain or discomfort. Feeling short of breath. Suddenly feeling light-headed or weak. Getting tired easily during activity. Fainting.  Sweating. In some cases, there are no symptoms. How is this treated? Treatment for this condition depends on underlying conditions and how you feel when you have atrial fibrillation. They include: Medicines to: Prevent blood clots. Treat heart rate or heart rhythm problems. Using devices, such  as a pacemaker, to correct heart rhythm problems. Doing surgery to remove the part of the heart that sends bad signals. Closing an area where clots can form in the heart (left atrial appendage). In some cases, your doctor will treat other underlying conditions. Follow these instructions at home: Medicines Take over-the-counter and prescription medicines only as told by your doctor. Do not take any new medicines without first talking to your doctor. If you are taking blood thinners: Talk with your doctor before you take any medicines that have aspirin or NSAIDs, such as ibuprofen, in them. Take your medicine exactly as told by your doctor. Take it at the same time each day. Avoid activities that could hurt or bruise you. Follow instructions about how to prevent falls. Wear a bracelet that says you are taking blood thinners. Or, carry a card that lists what medicines you take. Lifestyle     Do not use any products that have nicotine or tobacco in them. These include cigarettes, e-cigarettes, and chewing tobacco. If you need help quitting, ask your doctor. Eat heart-healthy foods. Talk with your doctor about the right eating plan for you. Exercise regularly as told by your doctor. Do not drink alcohol. Lose weight if you are overweight. Do not use drugs, including cannabis. General instructions If you have a condition that causes breathing to stop for a short period of time (apnea), treat it as told by your doctor. Keep a healthy weight. Do not use diet pills unless your doctor says they are safe for you. Diet pills may make heart problems worse. Keep all follow-up visits as told by your doctor. This is important. Contact a doctor if: You notice a change in the speed, rhythm, or strength of your heartbeat. You are taking a blood-thinning medicine and you get more bruising. You get tired more easily when you move or exercise. You have a sudden change in weight. Get help right away  if:  You have pain in your chest or your belly (abdomen). You have trouble breathing. You have side effects of blood thinners, such as blood in your vomit, poop (stool), or pee (urine), or bleeding that cannot stop. You have any signs of a stroke. "BE FAST" is an easy way to remember the main warning signs: B - Balance. Signs are dizziness, sudden trouble walking, or loss of balance. E - Eyes. Signs are trouble seeing or a change in how you see. F - Face. Signs are sudden weakness or loss of feeling in the face, or the face or eyelid drooping on one side. A - Arms. Signs are weakness or loss of feeling in an arm. This happens suddenly and usually on one side of the body. S - Speech. Signs are sudden trouble speaking, slurred speech, or trouble understanding what people say. T - Time. Time to call emergency services. Write down what time symptoms started. You have other signs of a stroke, such as: A sudden, very bad headache with no known cause. Feeling like you may vomit (nausea). Vomiting. A seizure. These symptoms may be an emergency. Do not wait to see if the symptoms will go away. Get medical help right away. Call your local emergency services (911 in the [place] Do  not drive yourself to the hospital. Summary Atrial fibrillation is a type of heartbeat that is irregular or fast. You are at higher risk of this condition if you smoke, are older, have diabetes, or are overweight. Follow your doctor's instructions about medicines, diet, exercise, and follow-up visits. Get help right away if you have signs or symptoms of a stroke. Get help right away if you cannot catch your breath, or you have chest pain or discomfort. This information is not intended to replace advice given to you by your health care provider. Make sure you discuss any questions you have with your health care provider. Document Revised: 08/18/2018 Document Reviewed: 08/18/2018 Elsevier Patient Education  [place].

## 2021-11-12 NOTE — Progress Notes (Unsigned)
Synopsis: Referred for emphysema, dyspnea by Maurice Small, MD  Subjective:   PATIENT ID: Kathryn Bruce GENDER: female DOB: 12-Oct-1945, MRN: [id number]  No chief complaint on file.  89yF with history of CAD sp PCI, Smoking, ?OSA, DM2, obesity, referred for dyspnea, emphysema  Otherwise pertinent review of systems is negative.  Past Medical History:  Diagnosis Date   Acute ST elevation myocardial infarction (STEMI) of anterior wall ([place]) 04/28/2020   Aortic atherosclerosis ([place]) 10/05/2017   Arthritis    "spine, knees" (10/06/2017)   CAD (coronary artery disease), native coronary artery 10/05/2017   10/06/2016 cardiac catheterization showing 99% LAD stenosis, 20 to 30% RCA and circumflex stenosis, 3.5 x 15 mm Sierra stent in proximal LAD by Dr. Angelena Form   Cancer [facility])    SKIN CANCDER REMOVED FROM NOSE    Chronic lower back pain    Diabetes mellitus type 2, noninsulin dependent ([place]) 10/05/2017   GERD (gastroesophageal reflux disease)    Hidradenitis    Hypercholesteremia    Hyperlipidemia 10/05/2017   Hypertension    Obesity (BMI 30-39.9) 10/05/2017   Osteoarthritis (arthritis due to wear and tear of joints)    right knee   Sleep apnea    STOP BANG SCORE 5   Tobacco use 09/05/2011   Type II diabetes mellitus ([place])      Family History  Problem Relation Age of Onset   Dementia Mother 17   CAD Father 78   Cancer Sister 8       lung   Lung cancer Brother 5   Cancer Maternal Grandfather        colon   Premature CHD Son    Atrial fibrillation Sister    Atrial fibrillation Sister      Past Surgical History:  Procedure Laterality Date   CORONARY ANGIOPLASTY WITH STENT PLACEMENT  10/06/2017   CORONARY STENT INTERVENTION N/A 10/06/2017   Procedure: CORONARY STENT INTERVENTION;  Surgeon: Burnell Blanks, MD;  Location: [place] [facility];  Service: Cardiovascular;  Laterality: N/A;   CORONARY/GRAFT ACUTE MI REVASCULARIZATION N/A 04/28/2020   Procedure: Coronary/Graft  Acute MI Revascularization;  Surgeon: Troy Sine, MD;  Location: [place] [facility];  Service: Cardiovascular;  Laterality: N/A;   HERNIA REPAIR     LEFT HEART CATH AND CORONARY ANGIOGRAPHY N/A 10/06/2017   Procedure: LEFT HEART CATH AND CORONARY ANGIOGRAPHY;  Surgeon: Burnell Blanks, MD;  Location: [place] [facility];  Service: Cardiovascular;  Laterality: N/A;   LEFT HEART CATH AND CORONARY ANGIOGRAPHY N/A 04/28/2020   Procedure: LEFT HEART CATH AND CORONARY ANGIOGRAPHY;  Surgeon: Troy Sine, MD;  Location: [place] [facility];  Service: Cardiovascular;  Laterality: N/A;   MULTIPLE TOOTH EXTRACTIONS  2017   TONSILLECTOMY  1963   VENTRAL HERNIA REPAIR  09/04/2011   Procedure: LAPAROSCOPIC VENTRAL HERNIA;  Surgeon: Gayland Curry, MD,FACS;  Location: [facility];  Service: General;  Laterality: N/A;  LAPAROSCOPIC incarcerated VENTRAL HERNIA  repair     Social History   Socioeconomic History   Marital status: Widowed    Spouse name: Not on file   Number of children: Not on file   Years of education: Not on file   Highest education level: Not on file  Occupational History   Not on file  Tobacco Use   Smoking status: Former    Packs/day: 0.50    Years: 42.00    Total pack years: 21.00    Types: Cigarettes    Quit  date: 09/08/2019    Years since quitting: 2.1   Smokeless tobacco: Never  Vaping Use   Vaping Use: Every day  Substance and Sexual Activity   Alcohol use: No   Drug use: No   Sexual activity: Not on file  Other Topics Concern   Not on file  Social History Narrative   Not on file   Social Determinants of Health   Financial Resource Strain: Not on file  Food Insecurity: Not on file  Transportation Needs: Not on file  Physical Activity: Not on file  Stress: Not on file  Social Connections: Not on file  Intimate Partner Violence: Not on file     No Known Allergies   Outpatient Medications Prior to Visit  Medication Sig Dispense Refill   acetaminophen  (TYLENOL) 500 MG tablet Take 1,000 mg by mouth every 8 (eight) hours as needed (pain).      apixaban (ELIQUIS) 5 MG TABS tablet Take 1 tablet (5 mg total) by mouth 2 (two) times daily. (Patient not taking: Reported on 11/06/2021) 60 tablet 0   atorvastatin (LIPITOR) 80 MG tablet TAKE 1 TABLET(80 MG) BY MOUTH DAILY 90 tablet 2   blood glucose meter kit and supplies KIT Dispense based on patient and insurance preference. Use up to four times daily as directed. 1 each 0   clopidogrel (PLAVIX) 75 MG tablet Take 1 tablet (75 mg total) by mouth daily. 90 tablet 3   DULoxetine (CYMBALTA) 60 MG capsule Take 1 capsule by mouth daily.     ezetimibe (ZETIA) 10 MG tablet TAKE 1 TABLET(10 MG) BY MOUTH DAILY 90 tablet 2   JARDIANCE 25 MG TABS tablet Take 25 mg by mouth daily.     metFORMIN (GLUCOPHAGE) 500 MG tablet Take 1,000 mg by mouth 2 (two) times daily with a meal.     metoprolol tartrate (LOPRESSOR) 25 MG tablet Take 1 tablet (25 mg total) by mouth 2 (two) times daily. 180 tablet 3   nitroGLYCERIN (NITROSTAT) 0.4 MG SL tablet Place 1 tablet (0.4 mg total) under the tongue every 5 (five) minutes x 3 doses as needed for chest pain. 25 tablet 12   omeprazole (PRILOSEC) 40 MG capsule Take 40 mg by mouth daily.  1   valsartan (DIOVAN) 320 MG tablet Take 1 tablet (320 mg total) by mouth daily. 90 tablet 3   No facility-administered medications prior to visit.       Objective:   Physical Exam:  General appearance: 76 y.o., female, NAD, conversant  Eyes: anicteric sclerae; PERRL, tracking appropriately HENT: NCAT; MMM Neck: Trachea midline; no lymphadenopathy, no JVD Lungs: CTAB, no crackles, no wheeze, with normal respiratory effort CV: RRR, no murmur  Abdomen: Soft, non-tender; non-distended, BS present  Extremities: No peripheral edema, warm Skin: Normal turgor and texture; no rash Psych: Appropriate affect Neuro: Alert and oriented to person and place, no focal deficit     There were no  vitals filed for this visit.   on *** LPM *** RA BMI Readings from Last 3 Encounters:  11/06/21 30.48 kg/m  11/04/21 30.48 kg/m  10/21/21 30.15 kg/m   Wt Readings from Last 3 Encounters:  11/06/21 186 lb (84.4 kg)  11/04/21 186 lb (84.4 kg)  10/21/21 186 lb 12.8 oz (84.7 kg)     CBC    Component Value Date/Time   WBC 12.8 (H) 11/04/2021 0247   RBC 5.31 (H) 11/04/2021 0247   HGB 12.7 11/04/2021 0247   HCT 41.5 11/04/2021 0247  PLT 498 (H) 11/04/2021 0247   MCV 78.2 (L) 11/04/2021 0247   MCH 23.9 (L) 11/04/2021 0247   MCHC 30.6 11/04/2021 0247   RDW 18.3 (H) 11/04/2021 0247   LYMPHSABS 1.8 04/28/2020 0701   MONOABS 1.3 (H) 04/28/2020 0701   EOSABS 0.1 04/28/2020 0701   BASOSABS 0.0 04/28/2020 0701    ***  Chest Imaging: CXR 11/04/21 reviewed by me unremarkable, stable nodule  LDCT Chest 11/05/20 reviewed by me with mild emphysema possible early traction bronchiectasis, stable dominant LLL 1cm nodule  Pulmonary Functions Testing Results:     No data to display          Echocardiogram:   TTE 04/2020:  1. Left ventricular ejection fraction, by estimation, is 50 to 55%. The  left ventricle has low normal function. The left ventricle demonstrates  regional wall motion abnormalities (see scoring diagram/findings for  description). Akinesis of apex. There is   mild left ventricular hypertrophy. Left ventricular diastolic parameters  are indeterminate.   2. Right ventricular systolic function is normal. The right ventricular  size is normal. Tricuspid regurgitation signal is inadequate for assessing  PA pressure.   3. The mitral valve is abnormal. Trivial mitral valve regurgitation. No  evidence of mitral stenosis. Moderate mitral annular calcification.   4. The aortic valve is tricuspid. Aortic valve regurgitation is not  visualized. Mild aortic valve sclerosis is present, with no evidence of  aortic valve stenosis.   5. Aortic dilatation noted. There is mild  dilatation of the ascending  aorta, measuring 38 mm.   6. The inferior vena cava is normal in size with greater than 50%  respiratory variability, suggesting right atrial pressure of 3 mmHg.   Heart Catheterization 04/28/20:   Dist Cx lesion is 30% stenosed. 1st Mrg lesion is 20% stenosed. Prox RCA lesion is 10% stenosed. Dist LAD lesion is 100% stenosed. Post intervention, there is a 0% residual stenosis. Prox LAD to Mid LAD lesion is 80% stenosed. The left ventricular systolic function is normal. LV end diastolic pressure is normal. The left ventricular ejection fraction is greater than 65% by visual estimate. Acute ST segment elevation myocardial infarction secondary to thrombotic subtotal occlusion within the previously placed proximal LAD stent with probable apical distal embolization.   Mild concomitant nonobstructive CAD involving the left circumflex 20% mild narrowings in the OM1 vessel and AV groove and mild irregularity in the mid RCA.   Successful PCI to the thrombotic lesion in the previously placed Xience 3.5 x 15 mm stent treated with pliant balloon dilatation up to 3.5 mm.  Residual stenosis 0 with brisk TIMI-3 flow.  However there is still probable abrupt cut off at the very apex of the LAD due to initial thrombus embolization   Hyperdynamic LV function with EF estimate greater than 65% without focal wall motion abnormality. LVEDP 12 mm.   RECOMMENDATION: DAPT indefinitely in this patient who had previously undergone stenting of her proximal LAD with subsequent Plavix discontinuation.  Continue Aggrastat 18 hours post procedure.  Aggressive lipid-lowering therapy and optimal blood pressure control.  Smoking cessation is essential.    Assessment & Plan:    Plan:      Maryjane Hurter, MD [facility] 11/12/2021 5:33 PM

## 2021-11-13 ENCOUNTER — Encounter: Payer: Self-pay | Admitting: Student

## 2021-11-13 ENCOUNTER — Other Ambulatory Visit (HOSPITAL_COMMUNITY): Payer: Self-pay

## 2021-11-13 ENCOUNTER — Telehealth: Payer: Self-pay | Admitting: Student

## 2021-11-13 ENCOUNTER — Ambulatory Visit (INDEPENDENT_AMBULATORY_CARE_PROVIDER_SITE_OTHER): Payer: Medicare Other | Admitting: Student

## 2021-11-13 VITALS — BP 146/86 | HR 84 | Temp 97.9°F | Ht 63.0 in | Wt 187.0 lb

## 2021-11-13 DIAGNOSIS — R0609 Other forms of dyspnea: Secondary | ICD-10-CM | POA: Diagnosis not present

## 2021-11-13 DIAGNOSIS — R0683 Snoring: Secondary | ICD-10-CM

## 2021-11-13 MED ORDER — STIOLTO RESPIMAT 2.5-2.5 MCG/ACT IN AERS: 2.0000 | INHALATION_SPRAY | Freq: Every day | RESPIRATORY_TRACT | 0 refills | Status: DC

## 2021-11-13 MED ORDER — ALBUTEROL SULFATE HFA 108 (90 BASE) MCG/ACT IN AERS: 2.0000 | INHALATION_SPRAY | Freq: Four times a day (QID) | RESPIRATORY_TRACT | 6 refills | Status: DC | PRN

## 2021-11-13 NOTE — Patient Instructions (Addendum)
-   we will schedule PFTs (breathing tests), home sleep study - start stiolto 2 puffs once daily (can also space out to 1 puff when you wake up, one puff at lunch). Would stop this 3 days before your breathing tests. - albuterol 1-2 puffs as needed - this is your RESCUE inhaler, ideally only need it 1 time a week or so or less. Can also use 5-20 minutes before heavy exertion. Try not to use it on the morning of your PFTs.  - see you in 8 weeks or sooner if need be!

## 2021-11-13 NOTE — Telephone Encounter (Signed)
What is least expensive laba/lama for her?  Thanks! 

## 2021-12-02 ENCOUNTER — Ambulatory Visit (INDEPENDENT_AMBULATORY_CARE_PROVIDER_SITE_OTHER): Payer: Medicare Other

## 2021-12-02 DIAGNOSIS — I480 Paroxysmal atrial fibrillation: Secondary | ICD-10-CM | POA: Diagnosis not present

## 2021-12-02 DIAGNOSIS — E785 Hyperlipidemia, unspecified: Secondary | ICD-10-CM

## 2021-12-02 DIAGNOSIS — D6859 Other primary thrombophilia: Secondary | ICD-10-CM | POA: Diagnosis not present

## 2021-12-02 DIAGNOSIS — I25118 Atherosclerotic heart disease of native coronary artery with other forms of angina pectoris: Secondary | ICD-10-CM

## 2021-12-02 DIAGNOSIS — I10 Essential (primary) hypertension: Secondary | ICD-10-CM | POA: Diagnosis not present

## 2021-12-02 LAB — ECHOCARDIOGRAM COMPLETE
AR max vel: 1.56 cm2
AV Area VTI: 1.68 cm2
AV Area mean vel: 1.65 cm2
AV Mean grad: 6 mmHg
AV Peak grad: 11.6 mmHg
Ao pk vel: 1.7 m/s
Area-P 1/2: 2.38 cm2
S' Lateral: 2.45 cm

## 2021-12-03 ENCOUNTER — Telehealth (HOSPITAL_BASED_OUTPATIENT_CLINIC_OR_DEPARTMENT_OTHER): Payer: Self-pay

## 2021-12-03 NOTE — Telephone Encounter (Signed)
Results called to patient who verbalizes understanding!    Patient asks what should she do if she goes in and out a. Fib, advised patient that if episodes are becoming prolonged or bothersome to let us know but to continue taking her Eliquis as prescribed.   ----- Message from Loel Dubonnet, NP sent at 12/03/2021  7:56 AM EDT ----- Echo with normal heart muscle function. Mild thickening and stiffness of heart muscle - prevent from worsening by keeping blood pressure well controlled. Trivial leaking of mitral valve not of concern. Apical wall motion improved from previous.    No significant abnormalities that would cause atrial fibrillation. Overall good result.

## 2021-12-03 NOTE — Telephone Encounter (Addendum)
Called results to patient and left results on VM (ok per DPR), instructions left to call office back if patient has any questions!     ----- Message from Loel Dubonnet, NP sent at 12/03/2021  7:56 AM EDT ----- Echo with normal heart muscle function. Mild thickening and stiffness of heart muscle - prevent from worsening by keeping blood pressure well controlled. Trivial leaking of mitral valve not of concern. Apical wall motion improved from previous.   No significant abnormalities that would cause atrial fibrillation. Overall good result.

## 2021-12-19 ENCOUNTER — Telehealth (HOSPITAL_BASED_OUTPATIENT_CLINIC_OR_DEPARTMENT_OTHER): Payer: Self-pay | Admitting: Cardiology

## 2021-12-19 DIAGNOSIS — I480 Paroxysmal atrial fibrillation: Secondary | ICD-10-CM

## 2021-12-19 MED ORDER — APIXABAN 5 MG PO TABS: 5.0000 mg | ORAL_TABLET | Freq: Two times a day (BID) | ORAL | 5 refills | Status: DC

## 2021-12-19 NOTE — Telephone Encounter (Signed)
Eliquis refill  

## 2021-12-19 NOTE — Telephone Encounter (Signed)
Pt c/o medication issue:  1. Name of Medication: Eliquis  2. How are you currently taking this medication (dosage and times per day)? 2 times a day  3. Are you having a reaction (difficulty breathing--STAT)?   4. What is your medication issue? Patient said she only had a month prescription for Eliquis.. Patient question is, does she need to continue taking Eliquis? If she does she need a prescription called in today please

## 2021-12-19 NOTE — Telephone Encounter (Signed)
Eliquis '5mg'$  refill request received. Patient is 76 years old, weight-84.8kg, Crea-0.62 on 11/04/2021, Diagnosis-Afib, and last seen by Dr. Laurann Montana on 11/06/2021. Dose is appropriate based on dosing criteria. Will send in refill to requested pharmacy.

## 2021-12-30 ENCOUNTER — Ambulatory Visit (HOSPITAL_BASED_OUTPATIENT_CLINIC_OR_DEPARTMENT_OTHER): Payer: Medicare Other | Admitting: Cardiology

## 2022-01-03 ENCOUNTER — Encounter (HOSPITAL_BASED_OUTPATIENT_CLINIC_OR_DEPARTMENT_OTHER): Payer: Self-pay | Admitting: Cardiology

## 2022-01-03 ENCOUNTER — Ambulatory Visit (HOSPITAL_BASED_OUTPATIENT_CLINIC_OR_DEPARTMENT_OTHER): Payer: Medicare Other | Admitting: Cardiology

## 2022-01-03 VITALS — BP 128/74 | HR 66 | Ht 63.0 in | Wt 189.0 lb

## 2022-01-03 DIAGNOSIS — D6859 Other primary thrombophilia: Secondary | ICD-10-CM

## 2022-01-03 DIAGNOSIS — I252 Old myocardial infarction: Secondary | ICD-10-CM

## 2022-01-03 DIAGNOSIS — E785 Hyperlipidemia, unspecified: Secondary | ICD-10-CM | POA: Diagnosis not present

## 2022-01-03 DIAGNOSIS — I25118 Atherosclerotic heart disease of native coronary artery with other forms of angina pectoris: Secondary | ICD-10-CM | POA: Diagnosis not present

## 2022-01-03 DIAGNOSIS — I480 Paroxysmal atrial fibrillation: Secondary | ICD-10-CM

## 2022-01-03 DIAGNOSIS — I10 Essential (primary) hypertension: Secondary | ICD-10-CM

## 2022-01-03 DIAGNOSIS — Z7901 Long term (current) use of anticoagulants: Secondary | ICD-10-CM

## 2022-01-03 MED ORDER — VALSARTAN 160 MG PO TABS: 160.0000 mg | ORAL_TABLET | Freq: Every day | ORAL | 3 refills | Status: DC

## 2022-01-03 NOTE — Patient Instructions (Signed)
Medication Instructions:  Once you finish Brilinta, start Plavix 75 mg daily  *If you need a refill on your cardiac medications before your next appointment, please call your pharmacy*   Lab Work: None ordered today   Testing/Procedures: None ordered today   Follow-Up: At [facility], you and your health needs are our priority.  As part of our continuing mission to provide you with exceptional heart care, we have created designated Provider Care Teams.  These Care Teams include your primary Cardiologist (physician) and Advanced Practice Providers (APPs -  Physician Assistants and Nurse Practitioners) who all work together to provide you with the care you need, when you need it.  We recommend signing up for the patient portal called "MyChart".  Sign up information is provided on this After Visit Summary.  MyChart is used to connect with patients for Virtual Visits (Telemedicine).  Patients are able to view lab/test results, encounter notes, upcoming appointments, etc.  Non-urgent messages can be sent to your provider as well.   To learn more about what you can do with MyChart, go to [email or web address].    Your next appointment:   6 month(s)  The format for your next appointment:   In Person  Provider:   Buford Dresser, MD         \

## 2022-01-14 NOTE — Progress Notes (Unsigned)
Synopsis: Referred for emphysema, dyspnea by Maurice Small, MD  Subjective:   PATIENT ID: Kathryn Bruce GENDER: female DOB: 12-28-1945, MRN: [id number]  No chief complaint on file.  76yF with history of CAD sp PCI, Smoking, ?OSA, DM2, obesity, referred for dyspnea, emphysema  Dyspneic for about a month and a half to two months. In last 2 years has actually lost about 20-30lb. She has no orthopnea. No BLE swelling. She had heaviness in her chest about a week ago, went to ED found to be in AF. Started on eliquis after that. No bleeding since then. Still on brilinta, will switch to plavix next wed-Thursday. Has never tried albuterol or any other inhaler, has not had course of prednisone for dyspnea.  No cough  She snores, no PND. She does frequently feel very sleepy during the day. No witnessed apneas.  She has no family history of lung disease   She worked in [employer] under[employer]. 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 She smoked for 40y 1ppd, quit 2ya. Vapes - nicotine products, no MJ.  Interval HPI: Started on trial of stiolto last visit  PFTs   HST not done yet  Otherwise pertinent review of systems is negative.  Past Medical History:  Diagnosis Date   Acute ST elevation myocardial infarction (STEMI) of anterior wall ([place]) 04/28/2020   Aortic atherosclerosis ([place]) 10/05/2017   Arthritis    "spine, knees" (10/06/2017)   CAD (coronary artery disease), native coronary artery 10/05/2017   10/06/2016 cardiac catheterization showing 99% LAD stenosis, 20 to 30% RCA and circumflex stenosis, 3.5 x 15 mm Sierra stent in proximal LAD by Dr. Angelena Form   Cancer [facility])    SKIN CANCDER REMOVED FROM NOSE    Chronic lower back pain    Diabetes mellitus type 2, noninsulin dependent ([place]) 10/05/2017   GERD (gastroesophageal reflux disease)    Hidradenitis    Hypercholesteremia    Hyperlipidemia 10/05/2017   Hypertension    Obesity (BMI 30-39.9) 10/05/2017   Osteoarthritis (arthritis due to wear and tear of joints)     right knee   Sleep apnea    STOP BANG SCORE 5   Tobacco use 09/05/2011   Type II diabetes mellitus ([place])      Family History  Problem Relation Age of Onset   Dementia Mother 44   CAD Father 48   Cancer Sister 28       lung   Lung cancer Sister    Atrial fibrillation Sister    Atrial fibrillation Sister    Lung cancer Brother 63   Cancer Maternal Grandfather        colon   Premature CHD Son      Past Surgical History:  Procedure Laterality Date   CORONARY ANGIOPLASTY WITH STENT PLACEMENT  10/06/2017   CORONARY STENT INTERVENTION N/A 10/06/2017   Procedure: CORONARY STENT INTERVENTION;  Surgeon: Burnell Blanks, MD;  Location: [place] [facility];  Service: Cardiovascular;  Laterality: N/A;   CORONARY/GRAFT ACUTE MI REVASCULARIZATION N/A 04/28/2020   Procedure: Coronary/Graft Acute MI Revascularization;  Surgeon: Troy Sine, MD;  Location: [place] [facility];  Service: Cardiovascular;  Laterality: N/A;   HERNIA REPAIR     LEFT HEART CATH AND CORONARY ANGIOGRAPHY N/A 10/06/2017   Procedure: LEFT HEART CATH AND CORONARY ANGIOGRAPHY;  Surgeon: Burnell Blanks, MD;  Location: [place] [facility];  Service: Cardiovascular;  Laterality: N/A;   LEFT HEART CATH AND CORONARY ANGIOGRAPHY N/A 04/28/2020   Procedure: LEFT HEART CATH AND CORONARY  ANGIOGRAPHY;  Surgeon: Troy Sine, MD;  Location: [place] [facility];  Service: Cardiovascular;  Laterality: N/A;   MULTIPLE TOOTH EXTRACTIONS  2017   TONSILLECTOMY  1963   VENTRAL HERNIA REPAIR  09/04/2011   Procedure: LAPAROSCOPIC VENTRAL HERNIA;  Surgeon: Gayland Curry, MD,FACS;  Location: [facility];  Service: General;  Laterality: N/A;  LAPAROSCOPIC incarcerated VENTRAL HERNIA  repair     Social History   Socioeconomic History   Marital status: Widowed    Spouse name: Not on file   Number of children: Not on file   Years of education: Not on file   Highest education level: Not on file  Occupational History   Not on  file  Tobacco Use   Smoking status: Former    Packs/day: 0.50    Years: 42.00    Total pack years: 21.00    Types: Cigarettes    Quit date: 09/08/2019    Years since quitting: 2.3   Smokeless tobacco: Never  Vaping Use   Vaping Use: Every day   Substances: Nicotine, Flavoring  Substance and Sexual Activity   Alcohol use: No   Drug use: No   Sexual activity: Not on file  Other Topics Concern   Not on file  Social History Narrative   Not on file   Social Determinants of Health   Financial Resource Strain: Not on file  Food Insecurity: Not on file  Transportation Needs: Not on file  Physical Activity: Not on file  Stress: Not on file  Social Connections: Not on file  Intimate Partner Violence: Not on file     No Known Allergies   Outpatient Medications Prior to Visit  Medication Sig Dispense Refill   albuterol (VENTOLIN HFA) 108 (90 Base) MCG/ACT inhaler Inhale 2 puffs into the lungs every 6 (six) hours as needed for wheezing or shortness of breath. 8 g 6   apixaban (ELIQUIS) 5 MG TABS tablet Take 1 tablet (5 mg total) by mouth 2 (two) times daily. 60 tablet 5   atorvastatin (LIPITOR) 80 MG tablet TAKE 1 TABLET(80 MG) BY MOUTH DAILY 90 tablet 2   blood glucose meter kit and supplies KIT Dispense based on patient and insurance preference. Use up to four times daily as directed. 1 each 0   clopidogrel (PLAVIX) 75 MG tablet Take 1 tablet (75 mg total) by mouth daily. (Patient not taking: Reported on 11/13/2021) 90 tablet 3   DULoxetine (CYMBALTA) 60 MG capsule Take 1 capsule by mouth daily.     ezetimibe (ZETIA) 10 MG tablet TAKE 1 TABLET(10 MG) BY MOUTH DAILY 90 tablet 2   JARDIANCE 25 MG TABS tablet Take 25 mg by mouth daily.     metFORMIN (GLUCOPHAGE) 500 MG tablet Take 1,000 mg by mouth 2 (two) times daily with a meal.     metoprolol tartrate (LOPRESSOR) 25 MG tablet Take 1 tablet (25 mg total) by mouth 2 (two) times daily. 180 tablet 3   nitroGLYCERIN (NITROSTAT) 0.4 MG SL  tablet Place 1 tablet (0.4 mg total) under the tongue every 5 (five) minutes x 3 doses as needed for chest pain. 25 tablet 12   omeprazole (PRILOSEC) 40 MG capsule Take 40 mg by mouth daily.  1   Ticagrelor (BRILINTA PO) Take 1 tablet by mouth in the morning and at bedtime. Unsure of strength     valsartan (DIOVAN) 160 MG tablet Take 1 tablet (160 mg total) by mouth daily. 90 tablet 3   No facility-administered  medications prior to visit.       Objective:   Physical Exam:  General appearance: 76 y.o., female, NAD, conversant  Eyes: anicteric sclerae; PERRL, tracking appropriately HENT: NCAT; MMM Neck: Trachea midline; no lymphadenopathy, no JVD Lungs: CTAB, no crackles, no wheeze, with normal respiratory effort CV: RRR, no murmur  Abdomen: Soft, non-tender; non-distended, BS present  Extremities: No peripheral edema, warm Skin: Normal turgor and texture; no rash Psych: Appropriate affect Neuro: Alert and oriented to person and place, no focal deficit     There were no vitals filed for this visit.    on RA BMI Readings from Last 3 Encounters:  01/03/22 33.48 kg/m  11/13/21 33.13 kg/m  11/06/21 30.48 kg/m   Wt Readings from Last 3 Encounters:  01/03/22 189 lb (85.7 kg)  11/13/21 187 lb (84.8 kg)  11/06/21 186 lb (84.4 kg)     CBC    Component Value Date/Time   WBC 12.8 (H) 11/04/2021 0247   RBC 5.31 (H) 11/04/2021 0247   HGB 12.7 11/04/2021 0247   HCT 41.5 11/04/2021 0247   PLT 498 (H) 11/04/2021 0247   MCV 78.2 (L) 11/04/2021 0247   MCH 23.9 (L) 11/04/2021 0247   MCHC 30.6 11/04/2021 0247   RDW 18.3 (H) 11/04/2021 0247   LYMPHSABS 1.8 04/28/2020 0701   MONOABS 1.3 (H) 04/28/2020 0701   EOSABS 0.1 04/28/2020 0701   BASOSABS 0.0 04/28/2020 0701      Chest Imaging: CXR 11/04/21 reviewed by me unremarkable, stable nodule  LDCT Chest 11/05/20 reviewed by me with mild emphysema possible early traction bronchiectasis, stable dominant LLL 1cm  nodule  Pulmonary Functions Testing Results:     No data to display          Echocardiogram:   TTE 04/2020:  1. Left ventricular ejection fraction, by estimation, is 50 to 55%. The  left ventricle has low normal function. The left ventricle demonstrates  regional wall motion abnormalities (see scoring diagram/findings for  description). Akinesis of apex. There is   mild left ventricular hypertrophy. Left ventricular diastolic parameters  are indeterminate.   2. Right ventricular systolic function is normal. The right ventricular  size is normal. Tricuspid regurgitation signal is inadequate for assessing  PA pressure.   3. The mitral valve is abnormal. Trivial mitral valve regurgitation. No  evidence of mitral stenosis. Moderate mitral annular calcification.   4. The aortic valve is tricuspid. Aortic valve regurgitation is not  visualized. Mild aortic valve sclerosis is present, with no evidence of  aortic valve stenosis.   5. Aortic dilatation noted. There is mild dilatation of the ascending  aorta, measuring 38 mm.   6. The inferior vena cava is normal in size with greater than 50%  respiratory variability, suggesting right atrial pressure of 3 mmHg.   Heart Catheterization 04/28/20:   Dist Cx lesion is 30% stenosed. 1st Mrg lesion is 20% stenosed. Prox RCA lesion is 10% stenosed. Dist LAD lesion is 100% stenosed. Post intervention, there is a 0% residual stenosis. Prox LAD to Mid LAD lesion is 80% stenosed. The left ventricular systolic function is normal. LV end diastolic pressure is normal. The left ventricular ejection fraction is greater than 65% by visual estimate. Acute ST segment elevation myocardial infarction secondary to thrombotic subtotal occlusion within the previously placed proximal LAD stent with probable apical distal embolization.   Mild concomitant nonobstructive CAD involving the left circumflex 20% mild narrowings in the OM1 vessel and AV groove and  mild irregularity  in the mid RCA.   Successful PCI to the thrombotic lesion in the previously placed Xience 3.5 x 15 mm stent treated with pliant balloon dilatation up to 3.5 mm.  Residual stenosis 0 with brisk TIMI-3 flow.  However there is still probable abrupt cut off at the very apex of the LAD due to initial thrombus embolization   Hyperdynamic LV function with EF estimate greater than 65% without focal wall motion abnormality. LVEDP 12 mm.   RECOMMENDATION: DAPT indefinitely in this patient who had previously undergone stenting of her proximal LAD with subsequent Plavix discontinuation.  Continue Aggrastat 18 hours post procedure.  Aggressive lipid-lowering therapy and optimal blood pressure control.  Smoking cessation is essential.    Assessment & Plan:   # DOE May be multifactorial but smoking history places at risk for COPD.   # Pulmonary nodule   # Snoring # Excessive daytime sleepiness  Plan: - we will schedule PFTs (breathing tests), home sleep study - start stiolto 2 puffs once daily (can also space out to 1 puff when you wake up, one puff at lunch). Would stop this 3 days before your breathing tests. Reviewed technique with her.  - albuterol 1-2 puffs as needed - this is your RESCUE inhaler, ideally only need it 1 time a week or so or less. Can also use 5-20 minutes before heavy exertion. Try not to use it on the morning of your PFTs.  - continues in LDCT screening program - see you in 8 weeks or sooner if need be!     Maryjane Hurter, MD [facility] 01/14/2022 5:47 PM

## 2022-01-15 ENCOUNTER — Encounter: Payer: Self-pay | Admitting: Student

## 2022-01-15 ENCOUNTER — Ambulatory Visit: Payer: Medicare Other | Admitting: Student

## 2022-01-15 ENCOUNTER — Ambulatory Visit (INDEPENDENT_AMBULATORY_CARE_PROVIDER_SITE_OTHER): Payer: Medicare Other | Admitting: Student

## 2022-01-15 VITALS — BP 124/70 | HR 85 | Temp 97.7°F | Ht 63.0 in | Wt 184.6 lb

## 2022-01-15 DIAGNOSIS — R0609 Other forms of dyspnea: Secondary | ICD-10-CM | POA: Diagnosis not present

## 2022-01-15 LAB — PULMONARY FUNCTION TEST
FEF 25-75 Post: 3.62 L/sec
FEF 25-75 Pre: 3.44 L/sec
FEF2575-%Change-Post: 5 %
FEF2575-%Pred-Post: 232 %
FEF2575-%Pred-Pre: 221 %
FEV1-%Change-Post: 1 %
FEV1-%Pred-Post: 135 %
FEV1-%Pred-Pre: 133 %
FEV1-Post: 2.69 L
FEV1-Pre: 2.65 L
FEV1FVC-%Change-Post: 2 %
FEV1FVC-%Pred-Pre: 114 %
FEV6-%Change-Post: -1 %
FEV6-%Pred-Post: 121 %
FEV6-%Pred-Pre: 122 %
FEV6-Post: 3.05 L
FEV6-Pre: 3.09 L
FEV6FVC-%Pred-Post: 105 %
FEV6FVC-%Pred-Pre: 105 %
FVC-%Change-Post: -1 %
FVC-%Pred-Post: 115 %
FVC-%Pred-Pre: 116 %
FVC-Post: 3.05 L
FVC-Pre: 3.09 L
Post FEV1/FVC ratio: 88 %
Post FEV6/FVC ratio: 100 %
Pre FEV1/FVC ratio: 86 %
Pre FEV6/FVC Ratio: 100 %

## 2022-01-15 NOTE — Progress Notes (Signed)
Spirometry pre and post done today. 

## 2022-01-15 NOTE — Patient Instructions (Signed)
-   we will schedule PFTs (breathing tests), home sleep study, CT Chest - albuterol 1-2 puffs as needed - this is your RESCUE inhaler, ideally only need it 1 time a week or so or less. Can also use 5-20 minutes before heavy exertion. Try not to use it on the morning of your PFTs.  - see you in 3 months or sooner if need be!

## 2022-01-23 ENCOUNTER — Other Ambulatory Visit: Payer: Self-pay | Admitting: Cardiology

## 2022-01-27 DIAGNOSIS — E1169 Type 2 diabetes mellitus with other specified complication: Secondary | ICD-10-CM | POA: Diagnosis not present

## 2022-02-20 ENCOUNTER — Ambulatory Visit
Admission: RE | Admit: 2022-02-20 | Discharge: 2022-02-20 | Disposition: A | Discharge status: Home / Self Care | Payer: Medicare Other | Source: Ambulatory Visit | Attending: Student | Admitting: Student

## 2022-02-20 DIAGNOSIS — Z87891 Personal history of nicotine dependence: Secondary | ICD-10-CM | POA: Diagnosis not present

## 2022-02-20 DIAGNOSIS — J432 Centrilobular emphysema: Secondary | ICD-10-CM | POA: Diagnosis not present

## 2022-02-20 DIAGNOSIS — I2510 Atherosclerotic heart disease of native coronary artery without angina pectoris: Secondary | ICD-10-CM | POA: Diagnosis not present

## 2022-02-20 DIAGNOSIS — I358 Other nonrheumatic aortic valve disorders: Secondary | ICD-10-CM | POA: Diagnosis not present

## 2022-02-20 DIAGNOSIS — R0609 Other forms of dyspnea: Secondary | ICD-10-CM

## 2022-02-21 ENCOUNTER — Telehealth (HOSPITAL_BASED_OUTPATIENT_CLINIC_OR_DEPARTMENT_OTHER): Payer: Self-pay | Admitting: Cardiology

## 2022-02-21 NOTE — Telephone Encounter (Signed)
*  STAT* If patient is at the pharmacy, call can be transferred to refill team.   1. Which medications need to be refilled? (please list name of each medication and dose if known)   clopidogrel (PLAVIX) 75 MG tablet    2. Which pharmacy/location (including street and city if local pharmacy) is medication to be sent to?  [facility] [place], [address] AT [address]    3. Do they need a 30 day or 90 day supply? [place]

## 2022-02-21 NOTE — Telephone Encounter (Signed)
Spoke with patient and advised would send Reviewed chart and new Rx sent in August, called and spoke with [facility] there on file Asked them to go ahead and fill Rx, stated they would

## 2022-03-05 ENCOUNTER — Telehealth: Payer: Self-pay | Admitting: Cardiology

## 2022-03-05 NOTE — Telephone Encounter (Signed)
Returned call to patient,   Patient states that she was taking her Brilinta twice a day, but her plavix is only once per day. She states she spoke with Dr. Harrell Gave about getting off some of her medications, and she was about to come off but then had other issues. Explained to patient reasoning for the switch. Patient verbalizes understanding and will continue her plavix and eliquis.     "As her stent was placed >1 year ago recommend continuation of Plavix and not Brilinta. She should continue Eliquis '5mg'$  BID due to her PAF. Would hesitate to use Brilinta and Eliquis due to bleeding risk.   Loel Dubonnet, NP"

## 2022-03-05 NOTE — Telephone Encounter (Signed)
As her stent was placed >1 year ago recommend continuation of Plavix and not Brilinta. She should continue Eliquis '5mg'$  BID due to her PAF. Would hesitate to use Brilinta and Eliquis due to bleeding risk.  Loel Dubonnet, NP

## 2022-03-05 NOTE — Telephone Encounter (Signed)
  Pt c/o medication issue:  1. Name of Medication:   clopidogrel (PLAVIX) 75 MG tablet    2. How are you currently taking this medication (dosage and times per day)?   Take 1 tablet (75 mg total) by mouth daily.    3. Are you having a reaction (difficulty breathing--STAT)? No   4. What is your medication issue? Pt said, she use to take brillinta twice a day and DR. Harrell Gave changed it to Plavix once a day. She's been taking it and felt like its not as effective as brillinta and would like to speak with a nurse

## 2022-04-07 ENCOUNTER — Encounter (HOSPITAL_BASED_OUTPATIENT_CLINIC_OR_DEPARTMENT_OTHER): Payer: Self-pay | Admitting: Cardiology

## 2022-04-24 DIAGNOSIS — F324 Major depressive disorder, single episode, in partial remission: Secondary | ICD-10-CM | POA: Diagnosis not present

## 2022-04-24 DIAGNOSIS — Z79899 Other long term (current) drug therapy: Secondary | ICD-10-CM | POA: Diagnosis not present

## 2022-04-24 DIAGNOSIS — E1169 Type 2 diabetes mellitus with other specified complication: Secondary | ICD-10-CM | POA: Diagnosis not present

## 2022-04-24 DIAGNOSIS — D649 Anemia, unspecified: Secondary | ICD-10-CM | POA: Diagnosis not present

## 2022-04-24 DIAGNOSIS — R195 Other fecal abnormalities: Secondary | ICD-10-CM | POA: Diagnosis not present

## 2022-04-24 DIAGNOSIS — E1142 Type 2 diabetes mellitus with diabetic polyneuropathy: Secondary | ICD-10-CM | POA: Diagnosis not present

## 2022-05-22 DIAGNOSIS — R195 Other fecal abnormalities: Secondary | ICD-10-CM | POA: Diagnosis not present

## 2022-05-30 DIAGNOSIS — Z79899 Other long term (current) drug therapy: Secondary | ICD-10-CM | POA: Diagnosis not present

## 2022-06-02 DIAGNOSIS — J432 Centrilobular emphysema: Secondary | ICD-10-CM | POA: Diagnosis not present

## 2022-06-02 DIAGNOSIS — I700 Atherosclerosis of aorta: Secondary | ICD-10-CM | POA: Diagnosis not present

## 2022-06-02 DIAGNOSIS — R413 Other amnesia: Secondary | ICD-10-CM | POA: Diagnosis not present

## 2022-06-02 DIAGNOSIS — D6859 Other primary thrombophilia: Secondary | ICD-10-CM | POA: Diagnosis not present

## 2022-06-02 DIAGNOSIS — I509 Heart failure, unspecified: Secondary | ICD-10-CM | POA: Diagnosis not present

## 2022-06-02 DIAGNOSIS — R195 Other fecal abnormalities: Secondary | ICD-10-CM | POA: Diagnosis not present

## 2022-06-02 DIAGNOSIS — I4891 Unspecified atrial fibrillation: Secondary | ICD-10-CM | POA: Diagnosis not present

## 2022-06-08 NOTE — Progress Notes (Deleted)
Synopsis: Referred for emphysema, dyspnea by Maurice Small, MD  Subjective:   PATIENT ID: Kathryn Bruce GENDER: female DOB: 04-Dec-1945, MRN: [id number]  No chief complaint on file.  77yF with history of CAD sp PCI, Smoking, ?OSA, DM2, obesity, referred for dyspnea, emphysema  Dyspneic for about a month and a half to two months. In last 2 years has actually lost about 20-30lb. She has no orthopnea. No BLE swelling. She had heaviness in her chest about a week ago, went to ED found to be in AF. Started on eliquis after that. No bleeding since then. Still on brilinta, will switch to plavix next wed-Thursday. Has never tried albuterol or any other inhaler, has not had course of prednisone for dyspnea.  No cough  She snores, no PND. She does frequently feel very sleepy during the day. No witnessed apneas.  She has no family history of lung disease   She worked in [employer] under[employer]. 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 She smoked for 40y 1ppd, quit 2ya. Vapes - nicotine products, no MJ.  Interval HPI: Started on trial of stiolto last visit - couldn't tell much difference with sample. Since then she wonders if she had RSV 10-14 days ago. Still feels dyspneic with chores.   PFTs normal  HST not done yet - never called to schedule sleep study.  ------------------------------------------------------------------------------------ Switched to plavix since last visit from ticagrelor  Otherwise pertinent review of systems is negative.  Past Medical History:  Diagnosis Date   Acute ST elevation myocardial infarction (STEMI) of anterior wall ([place]) 04/28/2020   Aortic atherosclerosis ([place]) 10/05/2017   Arthritis    "spine, knees" (10/06/2017)   CAD (coronary artery disease), native coronary artery 10/05/2017   10/06/2016 cardiac catheterization showing 99% LAD stenosis, 20 to 30% RCA and circumflex stenosis, 3.5 x 15 mm Sierra stent in proximal LAD by Dr. Angelena Form   [facility])    SKIN CANCDER REMOVED FROM NOSE     Chronic lower back pain    Diabetes mellitus type 2, noninsulin dependent ([place]) 10/05/2017   GERD (gastroesophageal reflux disease)    Hidradenitis    Hypercholesteremia    Hyperlipidemia 10/05/2017   Hypertension    Obesity (BMI 30-39.9) 10/05/2017   Osteoarthritis (arthritis due to wear and tear of joints)    right knee   Sleep apnea    STOP BANG SCORE 5   Tobacco use 09/05/2011   Type II diabetes mellitus ([place])      Family History  Problem Relation Age of Onset   Dementia Mother 44   CAD Father 45   Cancer Sister 60       lung   Lung cancer Sister    Atrial fibrillation Sister    Atrial fibrillation Sister    Lung cancer Brother 18   Cancer Maternal Grandfather        colon   Premature CHD Son      Past Surgical History:  Procedure Laterality Date   CORONARY ANGIOPLASTY WITH STENT PLACEMENT  10/06/2017   CORONARY STENT INTERVENTION N/A 10/06/2017   Procedure: CORONARY STENT INTERVENTION;  Surgeon: Burnell Blanks, MD;  Location: [facility];  Service: Cardiovascular;  Laterality: N/A;   CORONARY/GRAFT ACUTE MI REVASCULARIZATION N/A 04/28/2020   Procedure: Coronary/Graft Acute MI Revascularization;  Surgeon: [place] Sine, MD;  Location: [facility];  Service: Cardiovascular;  Laterality: N/A;   HERNIA REPAIR     LEFT HEART CATH AND CORONARY ANGIOGRAPHY N/A 10/06/2017   Procedure: LEFT  HEART CATH AND CORONARY ANGIOGRAPHY;  Surgeon: Burnell Blanks, MD;  Location: [facility];  Service: Cardiovascular;  Laterality: N/A;   LEFT HEART CATH AND CORONARY ANGIOGRAPHY N/A 04/28/2020   Procedure: LEFT HEART CATH AND CORONARY ANGIOGRAPHY;  Surgeon: [place] Sine, MD;  Location: [facility];  Service: Cardiovascular;  Laterality: N/A;   MULTIPLE TOOTH EXTRACTIONS  2017   TONSILLECTOMY  1963   VENTRAL HERNIA REPAIR  09/04/2011   Procedure: LAPAROSCOPIC VENTRAL HERNIA;  Surgeon: Gayland Curry, MD,FACS;  Location: [facility];  Service: General;   Laterality: N/A;  LAPAROSCOPIC incarcerated VENTRAL HERNIA  repair     Social History   Socioeconomic History   Marital status: Widowed    Spouse name: Not on file   Number of children: Not on file   Years of education: Not on file   Highest education level: Not on file  Occupational History   Not on file  Tobacco Use   Smoking status: Former    Packs/day: 0.50    Years: 42.00    Additional pack years: 0.00    Total pack years: 21.00    Types: Cigarettes    Quit date: 09/08/2019    Years since quitting: 2.7   Smokeless tobacco: Never  Vaping Use   Vaping Use: Every day   Substances: Nicotine, Flavoring  Substance and Sexual Activity   Alcohol use: No   Drug use: No   Sexual activity: Not on file  Other Topics Concern   Not on file  Social History Narrative   Not on file   Social Determinants of Health   Financial Resource Strain: Not on file  Food Insecurity: Not on file  Transportation Needs: Not on file  Physical Activity: Not on file  Stress: Not on file  Social Connections: Not on file  Intimate Partner Violence: Not on file     No Known Allergies   Outpatient Medications Prior to Visit  Medication Sig Dispense Refill   albuterol (VENTOLIN HFA) 108 (90 Base) MCG/ACT inhaler Inhale 2 puffs into the lungs every 6 (six) hours as needed for wheezing or shortness of breath. 8 g 6   apixaban (ELIQUIS) 5 MG TABS tablet Take 1 tablet (5 mg total) by mouth 2 (two) times daily. 60 tablet 5   atorvastatin (LIPITOR) 80 MG tablet TAKE 1 TABLET(80 MG) BY MOUTH DAILY 90 tablet 2   blood glucose meter kit and supplies KIT Dispense based on patient and insurance preference. Use up to four times daily as directed. 1 each 0   clopidogrel (PLAVIX) 75 MG tablet Take 1 tablet (75 mg total) by mouth daily. 90 tablet 3   DULoxetine (CYMBALTA) 60 MG capsule Take 1 capsule by mouth daily.     ezetimibe (ZETIA) 10 MG tablet TAKE 1 TABLET(10 MG) BY MOUTH DAILY 90 tablet 2   JARDIANCE  25 MG TABS tablet Take 25 mg by mouth daily.     metFORMIN (GLUCOPHAGE) 500 MG tablet Take 1,000 mg by mouth 2 (two) times daily with a meal.     metoprolol tartrate (LOPRESSOR) 25 MG tablet Take 1 tablet (25 mg total) by mouth 2 (two) times daily. 180 tablet 3   nitroGLYCERIN (NITROSTAT) 0.4 MG SL tablet Place 1 tablet (0.4 mg total) under the tongue every 5 (five) minutes x 3 doses as needed for chest pain. 25 tablet 12   omeprazole (PRILOSEC) 40 MG capsule Take 40 mg by mouth daily.  1  Ticagrelor (BRILINTA PO) Take 1 tablet by mouth in the morning and at bedtime. Unsure of strength     valsartan (DIOVAN) 160 MG tablet Take 1 tablet (160 mg total) by mouth daily. 90 tablet 3   No facility-administered medications prior to visit.       Objective:   Physical Exam:  General appearance: 77 y.o., female, NAD, conversant  Eyes: anicteric sclerae; PERRL, tracking appropriately HENT: NCAT; MMM Neck: Trachea midline; no lymphadenopathy, no JVD Lungs: CTAB, no crackles, no wheeze, with normal respiratory effort CV: RRR, no murmur  Abdomen: Soft, non-tender; non-distended, BS present  Extremities: No peripheral edema, warm Skin: Normal turgor and texture; no rash Psych: Appropriate affect Neuro: Alert and oriented to person and place, no focal deficit     There were no vitals filed for this visit.     on RA BMI Readings from Last 3 Encounters:  01/15/22 32.70 kg/m  01/03/22 33.48 kg/m  11/13/21 33.13 kg/m   Wt Readings from Last 3 Encounters:  01/15/22 184 lb 9.6 oz (83.7 kg)  01/03/22 189 lb (85.7 kg)  11/13/21 187 lb (84.8 kg)     CBC    Component Value Date/Time   WBC 12.8 (H) 11/04/2021 0247   RBC 5.31 (H) 11/04/2021 0247   HGB 12.7 11/04/2021 0247   HCT 41.5 11/04/2021 0247   PLT 498 (H) 11/04/2021 0247   MCV 78.2 (L) 11/04/2021 0247   MCH 23.9 (L) 11/04/2021 0247   MCHC 30.6 11/04/2021 0247   RDW 18.3 (H) 11/04/2021 0247   LYMPHSABS 1.8 04/28/2020 0701    MONOABS 1.3 (H) 04/28/2020 0701   EOSABS 0.1 04/28/2020 0701   BASOSABS 0.0 04/28/2020 0701      Chest Imaging: CXR 11/04/21 reviewed by me unremarkable, stable nodule  LDCT Chest 11/05/20 reviewed by me with mild emphysema possible early traction bronchiectasis, stable dominant LLL 1cm nodule  LDCT Chest 02/2022, stable overall relative to prior including LLL nodule  Pulmonary Functions Testing Results:    Latest Ref Rng & Units 01/15/2022   12:58 PM  PFT Results  FVC-Pre L 3.09  P  FVC-Predicted Pre % 116  P  FVC-Post L 3.05  P  FVC-Predicted Post % 115  P  Pre FEV1/FVC % % 86  P  Post FEV1/FCV % % 88  P  FEV1-Pre L 2.65  P  FEV1-Predicted Pre % 133  P  FEV1-Post L 2.69  P    P Preliminary result  PFT 01/15/22 normal pre/post spiro  Echocardiogram:   TTE 04/2020:  1. Left ventricular ejection fraction, by estimation, is 50 to 55%. The  left ventricle has low normal function. The left ventricle demonstrates  regional wall motion abnormalities (see scoring diagram/findings for  description). Akinesis of apex. There is   mild left ventricular hypertrophy. Left ventricular diastolic parameters  are indeterminate.   2. Right ventricular systolic function is normal. The right ventricular  size is normal. Tricuspid regurgitation signal is inadequate for assessing  PA pressure.   3. The mitral valve is abnormal. Trivial mitral valve regurgitation. No  evidence of mitral stenosis. Moderate mitral annular calcification.   4. The aortic valve is tricuspid. Aortic valve regurgitation is not  visualized. Mild aortic valve sclerosis is present, with no evidence of  aortic valve stenosis.   5. Aortic dilatation noted. There is mild dilatation of the ascending  aorta, measuring 38 mm.   6. The inferior vena cava is normal in size with greater than 50%  respiratory variability, suggesting right atrial pressure of 3 mmHg.   Heart Catheterization 04/28/20:   Dist Cx lesion is 30%  stenosed. 1st Mrg lesion is 20% stenosed. Prox RCA lesion is 10% stenosed. Dist LAD lesion is 100% stenosed. Post intervention, there is a 0% residual stenosis. Prox LAD to Mid LAD lesion is 80% stenosed. The left ventricular systolic function is normal. LV end diastolic pressure is normal. The left ventricular ejection fraction is greater than 65% by visual estimate. Acute ST segment elevation myocardial infarction secondary to thrombotic subtotal occlusion within the previously placed proximal LAD stent with probable apical distal embolization.   Mild concomitant nonobstructive CAD involving the left circumflex 20% mild narrowings in the OM1 vessel and AV groove and mild irregularity in the mid RCA.   Successful PCI to the thrombotic lesion in the previously placed Xience 3.5 x 15 mm stent treated with pliant balloon dilatation up to 3.5 mm.  Residual stenosis 0 with brisk TIMI-3 flow.  However there is still probable abrupt cut off at the very apex of the LAD due to initial thrombus embolization   Hyperdynamic LV function with EF estimate greater than 65% without focal wall motion abnormality. LVEDP 12 mm.   RECOMMENDATION: DAPT indefinitely in this patient who had previously undergone stenting of her proximal LAD with subsequent Plavix discontinuation.  Continue Aggrastat 18 hours post procedure.  Aggressive lipid-lowering therapy and optimal blood pressure control.  Smoking cessation is essential.    Assessment & Plan:   # DOE May be multifactorial. Does have mild emphysema and fibrosis, spiro without obstruction. No response symptomatically to stiolto. Will see if ticagrelor contributing as she switches to plavix soon but she's been on ticagrelor for a long time and I guess I would have expected assd dyspnea to improve by now.  # Pulmonary nodule   # Snoring # Excessive daytime sleepiness  # History of smoking  Plan: - we will schedule PFTs (breathing tests), home sleep study,  LDCT Chest (will also see if there's clear evidence of progression of very early/mild fibrosis) - albuterol 1-2 puffs as needed - this is your RESCUE inhaler, ideally only need it 1 time a week or so or less. Can also use 5-20 minutes before heavy exertion. Try not to use it on the morning of your PFTs.  - see you in 3 months or sooner if need be!     Maryjane Hurter, MD [facility] 06/08/2022 2:36 PM

## 2022-06-09 ENCOUNTER — Ambulatory Visit: Payer: Medicare Other | Admitting: Student

## 2022-06-16 ENCOUNTER — Telehealth (HOSPITAL_BASED_OUTPATIENT_CLINIC_OR_DEPARTMENT_OTHER): Payer: Self-pay

## 2022-06-16 DIAGNOSIS — E611 Iron deficiency: Secondary | ICD-10-CM | POA: Diagnosis not present

## 2022-06-16 MED ORDER — EZETIMIBE 10 MG PO TABS: 10.0000 mg | ORAL_TABLET | Freq: Every day | ORAL | 1 refills | Status: DC

## 2022-06-16 NOTE — Telephone Encounter (Signed)
Received fax from [facility] requesting refills for Ezetimibe. Rx request sent to pharmacy.

## 2022-06-20 ENCOUNTER — Ambulatory Visit (HOSPITAL_BASED_OUTPATIENT_CLINIC_OR_DEPARTMENT_OTHER): Payer: Medicare Other | Admitting: Cardiology

## 2022-06-20 ENCOUNTER — Encounter (HOSPITAL_BASED_OUTPATIENT_CLINIC_OR_DEPARTMENT_OTHER): Payer: Self-pay | Admitting: Cardiology

## 2022-06-20 VITALS — BP 136/78 | HR 60 | Ht 63.0 in | Wt 182.2 lb

## 2022-06-20 DIAGNOSIS — D6859 Other primary thrombophilia: Secondary | ICD-10-CM

## 2022-06-20 DIAGNOSIS — I2510 Atherosclerotic heart disease of native coronary artery without angina pectoris: Secondary | ICD-10-CM | POA: Diagnosis not present

## 2022-06-20 DIAGNOSIS — E782 Mixed hyperlipidemia: Secondary | ICD-10-CM

## 2022-06-20 DIAGNOSIS — I252 Old myocardial infarction: Secondary | ICD-10-CM

## 2022-06-20 DIAGNOSIS — K921 Melena: Secondary | ICD-10-CM

## 2022-06-20 DIAGNOSIS — I10 Essential (primary) hypertension: Secondary | ICD-10-CM | POA: Diagnosis not present

## 2022-06-20 DIAGNOSIS — I480 Paroxysmal atrial fibrillation: Secondary | ICD-10-CM

## 2022-06-20 DIAGNOSIS — Z7901 Long term (current) use of anticoagulants: Secondary | ICD-10-CM

## 2022-06-20 MED ORDER — VALSARTAN 320 MG PO TABS: 320.0000 mg | ORAL_TABLET | Freq: Every day | ORAL | 3 refills | Status: AC

## 2022-06-20 NOTE — Progress Notes (Signed)
Cardiology Office Note:    Date:  06/20/2022   ID:  Kathryn Bruce, DOB [birth year], MRN [id number]  PCP:  Darrow Bussing, MD  Cardiologist:  Jodelle Red, MD  Referring MD: Shirlean Mylar, MD   Chief Complaint:  Follow up  History of Present Illness:    Kathryn Bruce is a 77 y.o. female with a history of paroxysmal atrial fibrillation, CAD with NSTEMI in 09/2017 s/p DES to proximal LAD followed by STEMI 04/2020 due to in stent thrombosis, hypertension, hyperlipidemia, type 2 diabetes mellitus, obesity, and prior tobacco use. Presents here for follow up.  Today: Has been having blood in stool, pending GI consult and likely colonoscopy. Has been black stool and mustard color stool. Black stool is very sticky. Last Hgb 10.6 4/8. I don't have a prior to compare to, but she was told the one prior her Hgb had gone up. Still taking apixaban and clopidogrel.  Has not felt any afib. No chest pain. Appetite is good. Has rare dizziness with changing position.   Checks BP at home, earlier today was much better controlled than what we got here in the office. No very high blood pressure since changing to valsartan. Had 320 mg dose of valsartan at home, has been tolerating this well.   Denies chest pain, shortness of breath at rest or with normal exertion. No PND, orthopnea, LE edema or unexpected weight gain. No syncope or palpitations.   Past Medical History:  Diagnosis Date   Acute ST elevation myocardial infarction (STEMI) of anterior wall 04/28/2020   Aortic atherosclerosis 10/05/2017   Arthritis    "spine, knees" (10/06/2017)   CAD (coronary artery disease), native coronary artery 10/05/2017   10/06/2016 cardiac catheterization showing 99% LAD stenosis, 20 to 30% RCA and circumflex stenosis, 3.5 x 15 mm Sierra stent in proximal LAD by Dr. Clifton James   Cancer    SKIN CANCDER REMOVED FROM NOSE    Chronic lower back pain    Diabetes mellitus type 2, noninsulin dependent 10/05/2017   GERD  (gastroesophageal reflux disease)    Hidradenitis    Hypercholesteremia    Hyperlipidemia 10/05/2017   Hypertension    Obesity (BMI 30-39.9) 10/05/2017   Osteoarthritis (arthritis due to wear and tear of joints)    right knee   Sleep apnea    STOP BANG SCORE 5   Tobacco use 09/05/2011   Type II diabetes mellitus    Past Surgical History:  Procedure Laterality Date   CORONARY ANGIOPLASTY WITH STENT PLACEMENT  10/06/2017   CORONARY STENT INTERVENTION N/A 10/06/2017   Procedure: CORONARY STENT INTERVENTION;  Surgeon: Kathleene Hazel, MD;  Location: [facility] INVASIVE CV LAB;  Service: Cardiovascular;  Laterality: N/A;   CORONARY/GRAFT ACUTE MI REVASCULARIZATION N/A 04/28/2020   Procedure: Coronary/Graft Acute MI Revascularization;  Surgeon: Lennette Bihari, MD;  Location: [facility] INVASIVE CV LAB;  Service: Cardiovascular;  Laterality: N/A;   HERNIA REPAIR     LEFT HEART CATH AND CORONARY ANGIOGRAPHY N/A 10/06/2017   Procedure: LEFT HEART CATH AND CORONARY ANGIOGRAPHY;  Surgeon: Kathleene Hazel, MD;  Location: [facility] INVASIVE CV LAB;  Service: Cardiovascular;  Laterality: N/A;   LEFT HEART CATH AND CORONARY ANGIOGRAPHY N/A 04/28/2020   Procedure: LEFT HEART CATH AND CORONARY ANGIOGRAPHY;  Surgeon: Lennette Bihari, MD;  Location: [facility] INVASIVE CV LAB;  Service: Cardiovascular;  Laterality: N/A;   MULTIPLE TOOTH EXTRACTIONS  2017   TONSILLECTOMY  1963   VENTRAL HERNIA REPAIR  09/04/2011   Procedure: LAPAROSCOPIC  VENTRAL HERNIA;  Surgeon: Atilano Ina, MD,FACS;  Location: [facility] ORS;  Service: General;  Laterality: N/A;  LAPAROSCOPIC incarcerated VENTRAL HERNIA  repair      Current Meds  Medication Sig   albuterol (VENTOLIN HFA) 108 (90 Base) MCG/ACT inhaler Inhale 2 puffs into the lungs every 6 (six) hours as needed for wheezing or shortness of breath.   apixaban (ELIQUIS) 5 MG TABS tablet Take 1 tablet (5 mg total) by mouth 2 (two) times daily.   atorvastatin (LIPITOR) 80 MG tablet TAKE 1 TABLET(80  MG) BY MOUTH DAILY   blood glucose meter kit and supplies KIT Dispense based on patient and insurance preference. Use up to four times daily as directed.   clopidogrel (PLAVIX) 75 MG tablet Take 1 tablet (75 mg total) by mouth daily.   DULoxetine (CYMBALTA) 60 MG capsule Take 1 capsule by mouth daily.   ezetimibe (ZETIA) 10 MG tablet Take 1 tablet (10 mg total) by mouth daily.   JARDIANCE 25 MG TABS tablet Take 25 mg by mouth daily.   metFORMIN (GLUCOPHAGE) 500 MG tablet Take 1,000 mg by mouth 2 (two) times daily with a meal.   metoprolol tartrate (LOPRESSOR) 25 MG tablet Take 1 tablet (25 mg total) by mouth 2 (two) times daily.   nitroGLYCERIN (NITROSTAT) 0.4 MG SL tablet Place 1 tablet (0.4 mg total) under the tongue every 5 (five) minutes x 3 doses as needed for chest pain.   omeprazole (PRILOSEC) 40 MG capsule Take 40 mg by mouth daily.   [DISCONTINUED] valsartan (DIOVAN) 160 MG tablet Take 1 tablet (160 mg total) by mouth daily.     Allergies:   Patient has no known allergies.   Social History   Tobacco Use   Smoking status: Former    Packs/day: 0.50    Years: 42.00    Additional pack years: 0.00    Total pack years: 21.00    Types: Cigarettes    Quit date: 09/08/2019    Years since quitting: 2.7   Smokeless tobacco: Never  Vaping Use   Vaping Use: Every day   Substances: Nicotine, Flavoring  Substance Use Topics   Alcohol use: No   Drug use: No     Family Hx: The patient's family history includes Atrial fibrillation in her sister and sister; CAD (age of onset: 26) in her father; Cancer in her maternal grandfather; Cancer (age of onset: 75) in her sister; Dementia (age of onset: 1) in her mother; Lung cancer in her sister; Lung cancer (age of onset: 92) in her brother; Premature CHD in her son.  ROS:   Please see the history of present illness.    All other systems reviewed and are negative.   Prior CV studies:   The following studies were reviewed today:  Echo  12/02/2021: 1. Left ventricular ejection fraction, by estimation, is 60 to 65%. The  left ventricle has normal function. The left ventricle has no regional  wall motion abnormalities. There is mild concentric left ventricular  hypertrophy. Left ventricular diastolic  parameters are consistent with Grade I diastolic dysfunction (impaired  relaxation). Elevated left atrial pressure.   2. Right ventricular systolic function is normal. The right ventricular  size is normal.   3. Left atrial size was mildly dilated.   4. The mitral valve is normal in structure. Trivial mitral valve  regurgitation. No evidence of mitral stenosis.   5. The aortic valve is tricuspid. There is mild calcification of the  aortic valve. There  is mild thickening of the aortic valve. Aortic valve  regurgitation is not visualized. Aortic valve sclerosis is present, with  no evidence of aortic valve stenosis.   CT Chest 11/02/2020: FINDINGS: Cardiovascular: Normal heart size. No pericardial effusion. Three-vessel coronary artery calcifications. Mitral annular calcifications. Atherosclerotic disease thoracic aorta.   Mediastinum/Nodes: Thyroid and esophagus are unremarkable. No pathologically enlarged lymph nodes in the chest.   Lungs/Pleura: Central airways are patent. No consolidation, pleural effusion or pneumothorax. Scattered solid pulmonary nodules. Largest is located in the left lower lobe and measures 1.0 cm on series 3, image 176.   Upper Abdomen: Low-density left adrenal nodule is unchanged compared to prior exam, likely a benign adenoma. No acute findings.   Musculoskeletal: No chest wall mass or suspicious bone lesions identified.   IMPRESSION: Lung-RADS 2, benign appearance or behavior. Continue annual screening with low-dose chest CT without contrast in 12 months.   Coronary artery calcifications, aortic Atherosclerosis (ICD10-I70.0) and Emphysema (ICD10-J43.9).  Echo 04/29/20  1. Left  ventricular ejection fraction, by estimation, is 50 to 55%. The  left ventricle has low normal function. The left ventricle demonstrates  regional wall motion abnormalities (see scoring diagram/findings for  description). Akinesis of apex. There is   mild left ventricular hypertrophy. Left ventricular diastolic parameters  are indeterminate.   2. Right ventricular systolic function is normal. The right ventricular  size is normal. Tricuspid regurgitation signal is inadequate for assessing  PA pressure.   3. The mitral valve is abnormal. Trivial mitral valve regurgitation. No  evidence of mitral stenosis. Moderate mitral annular calcification.   4. The aortic valve is tricuspid. Aortic valve regurgitation is not  visualized. Mild aortic valve sclerosis is present, with no evidence of  aortic valve stenosis.   5. Aortic dilatation noted. There is mild dilatation of the ascending  aorta, measuring 38 mm.   6. The inferior vena cava is normal in size with greater than 50%  respiratory variability, suggesting right atrial pressure of 3 mmHg.   Cath/PCI 04/28/20 Dist Cx lesion is 30% stenosed. 1st Mrg lesion is 20% stenosed. Prox RCA lesion is 10% stenosed. Dist LAD lesion is 100% stenosed. Post intervention, there is a 0% residual stenosis. Prox LAD to Mid LAD lesion is 80% stenosed. The left ventricular systolic function is normal. LV end diastolic pressure is normal. The left ventricular ejection fraction is greater than 65% by visual estimate.   Acute ST segment elevation myocardial infarction secondary to thrombotic subtotal occlusion within the previously placed proximal LAD stent with probable apical distal embolization.   Mild concomitant nonobstructive CAD involving the left circumflex 20% mild narrowings in the OM1 vessel and AV groove and mild irregularity in the mid RCA.   Successful PCI to the thrombotic lesion in the previously placed Xience 3.5 x 15 mm stent treated with  pliant balloon dilatation up to 3.5 mm.  Residual stenosis 0 with brisk TIMI-3 flow.  However there is still probable abrupt cut off at the very apex of the LAD due to initial thrombus embolization   Hyperdynamic LV function with EF estimate greater than 65% without focal wall motion abnormality. LVEDP 12 mm.   RECOMMENDATION: DAPT indefinitely in this patient who had previously undergone stenting of her proximal LAD with subsequent Plavix discontinuation.  Continue Aggrastat 18 hours post procedure.  Aggressive lipid-lowering therapy and optimal blood pressure control.  Smoking cessation is essential.  Echo 03/13/19  1. Left ventricular ejection fraction, by visual estimation, is 65 to 70%. The  left ventricle has hyperdynamic function. There is no left ventricular hypertrophy.  2. Left ventricular diastolic parameters are consistent with Grade I diastolic dysfunction (impaired relaxation).  3. The left ventricle has no regional wall motion abnormalities.  4. Global right ventricle has normal systolic function.The right ventricular size is normal. No increase in right ventricular wall thickness.  5. Left atrial size was normal.  6. Right atrial size was normal.  7. The mitral valve is normal in structure. Trivial mitral valve regurgitation. No evidence of mitral stenosis.  8. The tricuspid valve is normal in structure.  9. The aortic valve is normal in structure. Aortic valve regurgitation is not visualized. No evidence of aortic valve sclerosis or stenosis. 10. The pulmonic valve was normal in structure. Pulmonic valve regurgitation is not visualized. 11. Normal pulmonary artery systolic pressure. 12. The inferior vena cava is normal in size with greater than 50% respiratory variability, suggesting right atrial pressure of 3 mmHg.  Labs/Other Tests and Data Reviewed:    EKG:  ECG personally reviewed 06/20/22: NSR at 60 bpm 01/03/2022 not ordered today 07/26/2021: NSR at 75 bpm, PRWP 05/03/20:  sinus bradycardia at 56 bpm, PRWP  Recent Labs: 11/04/2021: BUN 20; Creatinine, Ser 0.62; Hemoglobin 12.7; Magnesium 1.7; Platelets 498; Potassium 3.3; Sodium 141   Recent Lipid Panel Lab Results  Component Value Date/Time   CHOL 203 (H) 04/28/2020 07:01 AM   CHOL 166 09/03/2018 12:03 PM   TRIG 165 (H) 04/28/2020 07:01 AM   HDL 52 04/28/2020 07:01 AM   HDL 51 09/03/2018 12:03 PM   CHOLHDL 3.9 04/28/2020 07:01 AM   LDLCALC 118 (H) 04/28/2020 07:01 AM   LDLCALC 64 09/03/2018 12:03 PM    Wt Readings from Last 3 Encounters:  06/20/22 182 lb 3.2 oz (82.6 kg)  01/15/22 184 lb 9.6 oz (83.7 kg)  01/03/22 189 lb (85.7 kg)     Objective:    Vital Signs:  BP 136/78 (BP Location: Left Arm, Patient Position: Sitting, Cuff Size: Normal)   Pulse 60   Ht  (1.6 m)   Wt 182 lb 3.2 oz (82.6 kg)   BMI 32.28 kg/m    GEN: Well nourished, well developed in no acute distress HEENT: Normal, moist mucous membranes NECK: No JVD CARDIAC: regular rhythm, normal S1 and S2, no rubs or gallops. 1/6  murmur. VASCULAR: Radial and DP pulses 2+ bilaterally. No carotid bruits RESPIRATORY:  Clear to auscultation without rales, wheezing or rhonchi  ABDOMEN: Soft, non-tender, non-distended MUSCULOSKELETAL:  Ambulates independently SKIN: Warm and dry, no edema NEUROLOGIC:  Alert and oriented x 3. No focal neuro deficits noted. PSYCHIATRIC:  Normal affect    ASSESSMENT & PLAN:    1. Melena   2. Essential hypertension   3. Coronary artery disease involving native coronary artery of native heart without angina pectoris   4. History of ST elevation myocardial infarction (STEMI)   5. PAF (paroxysmal atrial fibrillation)   6. Hypercoagulable state   7. Long term current use of anticoagulant   8. Mixed hyperlipidemia    Melena: -pending GI consult and likely colonoscopy -being followed by Dr. Docia Chuck. I cannot see Hgb trend, last Hgb 10.6. Discussed that if Hgb is downtrending, I would hold  apixaban -if colonoscopy needed, ok to hold apixaban prior. Would like her to stay on clopidogrel given history of stent thrombosis  Hypertension: recheck much improved -continue valsartan 160 mg daily -continue metoprolol 25 mg BID. Alternatively, if BP control needed, could change to carvedilol  Atrial fibrillation, paroxysmal -CHA2DS2/VAS Stroke Risk Points=6  -on apixaban for hypercoagulable state -dropped to single antiplatelet with start of anticoagulation, see below  CAD with prior PCI for NSTEMI and then STEMI, aortic atherosclerosis: -changed from ticagrelor to clopidogrel with start of anticoagulation -with history of stent thrombosis, would not hold antiplatelet if at all possible -continue atorvastatin 80 mg -counseled on red flag warning signs that need immediate medical attention   Tobacco use: no longer smoking cigarettes, discussed avoiding vaping   HLD: goal LDL <55 given diabetes -continue both atorvastatin and ezetimibe -LDL last 84, prior was at goal. Working on lifestyle, may need to adjust regimen if not at goal at follow up  Type II diabetes, with CAD history -tolerating Jardiance, metformin   Cardiac risk counseling and prevention recommendations: -recommend heart healthy/Mediterranean diet, with whole grains, fruits, vegetable, fish, lean meats, nuts, and olive oil. Limit salt. -recommend moderate walking, 3-5 times/week for 30-50 minutes each session. Aim for at least 150 minutes.week. Goal should be pace of 3 miles/hours, or walking 1.5 miles in 30 minutes -recommend avoidance of tobacco products. Avoid excess alcohol.  Follow up: 3-months  Jodelle Red, MD, PhD, [facility]  [facility]  [place]  [facility] & Vascular at [facility] at [facility] [address], [address] [place], [place] [address] [phone]   Meds ordered this encounter  Medications   valsartan (DIOVAN) 320 MG tablet    Sig: Take 1  tablet (320 mg total) by mouth daily.    Dispense:  90 tablet    Refill:  3    Please wait to fill until pt call   Orders Placed This Encounter  Procedures   EKG 12-Lead   Patient Instructions  Medication Instructions:  Your physician recommends that you continue on your current medications as directed. Please refer to the Current Medication list given to you today.  *If you need a refill on your cardiac medications before your next appointment, please call your pharmacy*  Lab Work: NONE  Testing/Procedures: NONE  Follow-Up: At [facility], you and your health needs are our priority.  As part of our continuing mission to provide you with exceptional heart care, we have created designated Provider Care Teams.  These Care Teams include your primary Cardiologist (physician) and Advanced Practice Providers (APPs -  Physician Assistants and Nurse Practitioners) who all work together to provide you with the care you need, when you need it.  We recommend signing up for the patient portal called "MyChart".  Sign up information is provided on this After Visit Summary.  MyChart is used to connect with patients for Virtual Visits (Telemedicine).  Patients are able to view lab/test results, encounter notes, upcoming appointments, etc.  Non-urgent messages can be sent to your provider as well.   To learn more about what you can do with MyChart, go to [email or web address].    Your next appointment:   6 month(s)  The format for your next appointment:   In Person  Provider:   Jodelle Red, MD

## 2022-06-20 NOTE — Patient Instructions (Signed)
Medication Instructions:  Your physician recommends that you continue on your current medications as directed. Please refer to the Current Medication list given to you today.   *If you need a refill on your cardiac medications before your next appointment, please call your pharmacy*  Lab Work: NONE  Testing/Procedures: NONE   Follow-Up: At [facility], you and your health needs are our priority.  As part of our continuing mission to provide you with exceptional heart care, we have created designated Provider Care Teams.  These Care Teams include your primary Cardiologist (physician) and Advanced Practice Providers (APPs -  Physician Assistants and Nurse Practitioners) who all work together to provide you with the care you need, when you need it.  We recommend signing up for the patient portal called "MyChart".  Sign up information is provided on this After Visit Summary.  MyChart is used to connect with patients for Virtual Visits (Telemedicine).  Patients are able to view lab/test results, encounter notes, upcoming appointments, etc.  Non-urgent messages can be sent to your provider as well.   To learn more about what you can do with MyChart, go to [email or web address].    Your next appointment:   6 month(s)  The format for your next appointment:   In Person  Provider:   Bridgette Christopher, MD             

## 2022-06-24 ENCOUNTER — Telehealth: Payer: Self-pay | Admitting: *Deleted

## 2022-06-24 DIAGNOSIS — K921 Melena: Secondary | ICD-10-CM | POA: Diagnosis not present

## 2022-06-24 DIAGNOSIS — R634 Abnormal weight loss: Secondary | ICD-10-CM | POA: Diagnosis not present

## 2022-06-24 DIAGNOSIS — Z9229 Personal history of other drug therapy: Secondary | ICD-10-CM | POA: Diagnosis not present

## 2022-06-24 DIAGNOSIS — D649 Anemia, unspecified: Secondary | ICD-10-CM | POA: Diagnosis not present

## 2022-06-24 DIAGNOSIS — R195 Other fecal abnormalities: Secondary | ICD-10-CM | POA: Diagnosis not present

## 2022-06-24 NOTE — Telephone Encounter (Signed)
   Pre-operative Risk Assessment    Patient Name: CASSIDI MODESITT  DOB: 1945-11-18 MRN: [id number]      Request for Surgical Clearance    Procedure:   COLONOSCOPY / ENDOSCOPY  Date of Surgery:  Clearance 07/17/22                                 Surgeon:  DR. Lorenso Quarry Surgeon's Group or Practice Name:  [facility] Phone number:  [phone] Fax number:  [phone]   Type of Clearance Requested:   - Medical  - Pharmacy:  Hold Clopidogrel (Plavix) and Apixaban (Eliquis) PLAVIX X'S 5 DAYS & ELIQUIS X'S 2 DAYS   Type of Anesthesia:   PROPOFOL   Additional requests/questions:    Wilhemina Cash   06/24/2022, 3:48 PM

## 2022-06-26 NOTE — Telephone Encounter (Signed)
Patient with diagnosis of afib on Eliquis for anticoagulation.    Procedure: colonoscopy/endoscopy Date of procedure: 07/17/22  CHA2DS2-VASc Score = 6  This indicates a 9.7% annual risk of stroke. The patient's score is based upon: CHF History: 0 HTN History: 1 Diabetes History: 1 Stroke History: 0 Vascular Disease History: 1 Age Score: 2 Gender Score: 1   CrCl 61mL/min using adj body weight Platelet count 498K  Per office protocol, patient can hold Eliquis for 2 days prior to procedure as requested. Dr Cristal Deer ok with this per her 06/20/22 note, however she wishes for pt to remain on Plavix due to history of stent thrombosis.    **This guidance is not considered finalized until pre-operative APP has relayed final recommendations.**

## 2022-06-26 NOTE — Telephone Encounter (Signed)
   Primary Cardiologist: Jodelle Red, MD  Chart reviewed as part of pre-operative protocol coverage. Given past medical history and time since last visit, based on ACC/AHA guidelines, Allia F Ovando would be at acceptable risk for the planned procedure without further cardiovascular testing.   Patient was advised that if she develops new symptoms prior to surgery to contact our office to arrange a follow-up appointment.  He verbalized understanding.  Per office protocol, patient can hold Eliquis for 2 days prior to procedure as requested. Dr Cristal Deer ok with this per her 06/20/22 note, however she wishes for pt to remain on Plavix due to history of stent thrombosis.   I will route this recommendation to the requesting party via Epic fax function and remove from pre-op pool.  Please call with questions.   Levi Aland, NP-C  06/26/2022, 10:05 AM [address] Office [phone] Fax [phone]

## 2022-06-27 ENCOUNTER — Telehealth: Payer: Self-pay | Admitting: Student

## 2022-06-27 NOTE — Telephone Encounter (Signed)
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 An wanted to know if it is necessary to come back for F/u

## 2022-06-30 ENCOUNTER — Telehealth: Payer: Self-pay | Admitting: Cardiology

## 2022-06-30 NOTE — Telephone Encounter (Signed)
Duplicate request. This request will be closed and removed from the preop pool.

## 2022-06-30 NOTE — Telephone Encounter (Signed)
   Pre-operative Risk Assessment    Patient Name: Kathryn Bruce  DOB: 15-Aug-1945 MRN: [id number]      Request for Surgical Clearance    Procedure:   Colonoscopy/Endoscopy  Date of Surgery:  Clearance 07/17/22                                 Surgeon:  Dr. Lorenso Quarry Surgeon's Group or Practice Name:  [facility] Phone number:  [phone]  Fax number:  [phone]   Type of Clearance Requested:   - Pharmacy:  Hold Clopidogrel (Plavix) for 5 days and Eliquis for 2 days   Type of Anesthesia:   Propofol   Additional requests/questions:  Please fax a copy of medical clearance to the surgeon's office.  Mardelle Matte   06/30/2022, 2:38 PM

## 2022-06-30 NOTE — Telephone Encounter (Signed)
Spoke with patient. She states she still has occasional SOB with exertion. She states she has other issues going on and wants to focus on that first. She has a f/u scheduled for May and will talk to Dr. Thora Lance about a PFT then. I tried to make her a apt for that but she wanted to wait.   Routing info to Dr. Thora Lance as an Lorain Childes

## 2022-06-30 NOTE — Telephone Encounter (Signed)
Only if she is still having unexplained shortness of breath with exertion limiting her activities.

## 2022-07-01 NOTE — Telephone Encounter (Signed)
Per Dr. Cristal Deer, primary cardiologist, patient should remain on Plavix for lifetime unless there is significant bleeding risk. For EGD/colonoscopy, recommendation is to perform screening procedures and if there is indication to remove polyps or if there is evidence of bleeding to reach back out to [place] for further advisement regarding anti-platelet therapy.  Levi Aland, NP-C  07/01/2022, 10:46 AM [address], [address] Office [phone] Fax [phone]

## 2022-07-01 NOTE — Telephone Encounter (Signed)
I will update the requesting office to see notes from pre op APP Kathryn Bridegroom, NP.

## 2022-07-01 NOTE — Telephone Encounter (Signed)
Sounds like surgeon's office may post pone for 3 months in order to hold Plavix.   I have sent a message to pre op APP for further advice. Looks like pt was cleared and ok to hold Eliquis, though to remain on Plavix.

## 2022-07-01 NOTE — Telephone Encounter (Signed)
Office is calling back to see if the procedure is put off for 3 months, can the plavix be withheld then? Please advise

## 2022-07-02 ENCOUNTER — Telehealth: Payer: Self-pay | Admitting: Cardiology

## 2022-07-02 NOTE — Telephone Encounter (Signed)
Harriett Sine from [facility] is calling because it is part of their procedure for the patient to hold the Plavix for 5 days. Harriett Sine stated they want to verify and make sure the patient was okay to hold the Plavix for 5 days. Harriett Sine stated they did receive the okay to hold the Eliquis for 5 days. Harriett Sine stated their fax number is [phone]. Please advise.

## 2022-07-02 NOTE — Telephone Encounter (Signed)
Returned call to [facility].  Left her a detailed message pertaining to the recommendations of the clearance, see below:   Per Dr. Cristal Deer, primary cardiologist, patient should remain on Plavix for lifetime unless there is significant bleeding risk. For EGD/colonoscopy, recommendation is to perform screening procedures and if there is indication to remove polyps or if there is evidence of bleeding to reach back out to [place] for further advisement regarding anti-platelet therapy.    I also resent the fax over to their office.Marland Kitchen asked to call back and ask for preop of they had further questions.

## 2022-07-03 DIAGNOSIS — Z780 Asymptomatic menopausal state: Secondary | ICD-10-CM | POA: Diagnosis not present

## 2022-07-03 DIAGNOSIS — M8589 Other specified disorders of bone density and structure, multiple sites: Secondary | ICD-10-CM | POA: Diagnosis not present

## 2022-07-07 ENCOUNTER — Telehealth: Payer: Self-pay | Admitting: Cardiology

## 2022-07-07 NOTE — Telephone Encounter (Signed)
Need to be seen or ok to monitor hand?

## 2022-07-07 NOTE — Telephone Encounter (Signed)
Returned call to patient,   "If symptoms worsen, should follow up with her PCP. Otherwise, should be ok to monitor."   Reviewed the above symptoms with the patient.

## 2022-07-07 NOTE — Telephone Encounter (Signed)
If symptoms worsen, should follow up with her PCP. Otherwise, should be ok to monitor.

## 2022-07-07 NOTE — Telephone Encounter (Signed)
Patient was taking out the trash and the trash can hit her right hand. Patient states that her hand now has a blood blister and her hand is purple. Patient is on two blood thinner and wanted to know if she should come see the cardiologist or her PCP. Please advise.

## 2022-07-07 NOTE — Telephone Encounter (Signed)
Good morning Dr. Cristal Deer,  Kathryn Bruce is scheduled to undergo a colonoscopy procedure with possible removal of polyps.  She has a past medical history of CAD with NSTEMI in 09/2017 s/p DES to proximal LAD followed by STEMI 04/2020 due to in stent thrombosis, hypertension, hyperlipidemia, type 2 diabetes mellitus, obesity.  Could you please comment on holding Plavix 5 days prior for upcoming colonoscopy/EGD.  Please route your response to P CV DIV PREOP.  Thank you,  Robin Searing, NP

## 2022-07-07 NOTE — Telephone Encounter (Signed)
Kathryn Bruce called back and stated that the performing surgeon would like for the patient to hold plavix due to the patient being at a high risk for bleeding and having a history of polyps. They will likely need to remove polyps during this procedure. Please advise.

## 2022-07-11 ENCOUNTER — Telehealth: Payer: Self-pay | Admitting: Cardiology

## 2022-07-11 NOTE — Telephone Encounter (Signed)
Kathryn Bruce from [place] called for urgent request for Dr. Cristal Deer to respond for patient to get cleared for clopidogrel (PLAVIX) 75 MG tablet  or otherwise procedure will be cancelled.

## 2022-07-11 NOTE — Telephone Encounter (Signed)
Consulted with Dr. Cristal Deer, patient may hold plavix for 5 days, but to take ASA 81mg  daily while off plavix. Fax sent.

## 2022-07-13 ENCOUNTER — Other Ambulatory Visit (HOSPITAL_BASED_OUTPATIENT_CLINIC_OR_DEPARTMENT_OTHER): Payer: Self-pay | Admitting: Cardiology

## 2022-07-13 DIAGNOSIS — I480 Paroxysmal atrial fibrillation: Secondary | ICD-10-CM

## 2022-07-14 NOTE — Telephone Encounter (Signed)
Prescription refill request for Eliquis received. Indication: Afib  Last office visit: 06/20/22 Cristal Deer)  Scr: 0.62 (11/04/21)  Age: 77 Weight: 82.6kg  Appropriate dose. Refill sent.

## 2022-07-17 DIAGNOSIS — D128 Benign neoplasm of rectum: Secondary | ICD-10-CM | POA: Diagnosis not present

## 2022-07-17 DIAGNOSIS — K921 Melena: Secondary | ICD-10-CM | POA: Diagnosis not present

## 2022-07-17 DIAGNOSIS — D122 Benign neoplasm of ascending colon: Secondary | ICD-10-CM | POA: Diagnosis not present

## 2022-07-17 DIAGNOSIS — Z86010 Personal history of colonic polyps: Secondary | ICD-10-CM | POA: Diagnosis not present

## 2022-07-17 DIAGNOSIS — K219 Gastro-esophageal reflux disease without esophagitis: Secondary | ICD-10-CM | POA: Diagnosis not present

## 2022-07-17 DIAGNOSIS — Z09 Encounter for follow-up examination after completed treatment for conditions other than malignant neoplasm: Secondary | ICD-10-CM | POA: Diagnosis not present

## 2022-07-17 DIAGNOSIS — D509 Iron deficiency anemia, unspecified: Secondary | ICD-10-CM | POA: Diagnosis not present

## 2022-07-17 DIAGNOSIS — K2930 Chronic superficial gastritis without bleeding: Secondary | ICD-10-CM | POA: Diagnosis not present

## 2022-07-17 DIAGNOSIS — D124 Benign neoplasm of descending colon: Secondary | ICD-10-CM | POA: Diagnosis not present

## 2022-07-17 DIAGNOSIS — D120 Benign neoplasm of cecum: Secondary | ICD-10-CM | POA: Diagnosis not present

## 2022-07-17 DIAGNOSIS — D123 Benign neoplasm of transverse colon: Secondary | ICD-10-CM | POA: Diagnosis not present

## 2022-07-17 DIAGNOSIS — K2289 Other specified disease of esophagus: Secondary | ICD-10-CM | POA: Diagnosis not present

## 2022-07-21 DIAGNOSIS — D128 Benign neoplasm of rectum: Secondary | ICD-10-CM | POA: Diagnosis not present

## 2022-07-21 DIAGNOSIS — K2930 Chronic superficial gastritis without bleeding: Secondary | ICD-10-CM | POA: Diagnosis not present

## 2022-07-21 DIAGNOSIS — K219 Gastro-esophageal reflux disease without esophagitis: Secondary | ICD-10-CM | POA: Diagnosis not present

## 2022-07-23 NOTE — Telephone Encounter (Signed)
Per prior documentation by Dr. Cristal Deer,  patient may hold Plavix for 5 days prior to procedure IF procedure is urgent and there is significant bleeding risk with procedure. She has a history of having an ST elevation myocardial infarction due to stent rethrombosis following initial stenting of proximal LAD. Therefore, when  holding Plavix, she is at risk of stent rethrombosis and any elective procedures should be avoided.  Levi Aland, NP-C  07/23/2022, 3:17 PM [address], [address] Office [phone] Fax [phone]

## 2022-07-24 NOTE — Progress Notes (Signed)
Synopsis: Referred for emphysema, dyspnea by Darrow Bussing, MD  Subjective:   PATIENT ID: Kathryn Bruce GENDER: female DOB: 01/25/1946, MRN: [id number]  Chief Complaint  Patient presents with   Follow-up    Pt states she has been doing okay since last visit and denies any complaints.   75yF with history of CAD sp PCI, Smoking, ?OSA, DM2, obesity, referred for dyspnea, emphysema  Dyspneic for about a month and a half to two months. In last 2 years has actually lost about 20-30lb. She has no orthopnea. No BLE swelling. She had heaviness in her chest about a week ago, went to ED found to be in AF. Started on eliquis after that. No bleeding since then. Still on brilinta, will switch to plavix next wed-Thursday. Has never tried albuterol or any other inhaler, has not had course of prednisone for dyspnea.  No cough  She snores, no PND. She does frequently feel very sleepy during the day. No witnessed apneas.  She has no family history of lung disease   She worked in [employer] education [employer]. 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 She smoked for 40y 1ppd, quit 2ya. Vapes - nicotine products, no MJ.  Interval HPI: Taken off stiolto last visit as she really didn't have much response to it  Transitioned to plavix from ticagrelor - she didn't notice any change wrt her DOE after that change. She has had some bloody stools and recently underwent EGD/colonoscopy, she is awaiting results.   Stable dominant nodule LLL 1cm, lung volumes, DLCO today  HST not done yet  No courses of ABX or steroids since last visit.   Otherwise pertinent review of systems is negative.  Past Medical History:  Diagnosis Date   Acute ST elevation myocardial infarction (STEMI) of anterior wall (HCC) 04/28/2020   Aortic atherosclerosis (HCC) 10/05/2017   Arthritis    "spine, knees" (10/06/2017)   CAD (coronary artery disease), native coronary artery 10/05/2017   10/06/2016 cardiac catheterization showing 99% LAD stenosis, 20 to 30% RCA  and circumflex stenosis, 3.5 x 15 mm Sierra stent in proximal LAD by Dr. Clifton James   Cancer [facility])    SKIN CANCDER REMOVED FROM NOSE    Chronic lower back pain    Diabetes mellitus type 2, noninsulin dependent (HCC) 10/05/2017   GERD (gastroesophageal reflux disease)    Hidradenitis    Hypercholesteremia    Hyperlipidemia 10/05/2017   Hypertension    Obesity (BMI 30-39.9) 10/05/2017   Osteoarthritis (arthritis due to wear and tear of joints)    right knee   Sleep apnea    STOP BANG SCORE 5   Tobacco use 09/05/2011   Type II diabetes mellitus (HCC)      Family History  Problem Relation Age of Onset   Dementia Mother 78   CAD Father 77   Cancer Sister 51       lung   Lung cancer Sister    Atrial fibrillation Sister    Atrial fibrillation Sister    Lung cancer Brother 40   Cancer Maternal Grandfather        colon   Premature CHD Son      Past Surgical History:  Procedure Laterality Date   CORONARY ANGIOPLASTY WITH STENT PLACEMENT  10/06/2017   CORONARY STENT INTERVENTION N/A 10/06/2017   Procedure: CORONARY STENT INTERVENTION;  Surgeon: Kathleene Hazel, MD;  Location: [facility] INVASIVE CV LAB;  Service: Cardiovascular;  Laterality: N/A;   CORONARY/GRAFT ACUTE MI REVASCULARIZATION N/A 04/28/2020   Procedure: Coronary/Graft Acute MI  Revascularization;  Surgeon: Lennette Bihari, MD;  [facility] INVAS[facility] CV LAB;  Service: Cardiovascular;  Laterality: N/A;   HERNIA REPAIR     LEFT HEART CATH AND CORONARY ANGIOGRAPHY N/A 10/06/2017   Procedure: LEFT HEART CATH AND CORONARY ANGIOGRAPHY;  Surgeon: Kathleene Hazel, MD;  Location: [facility] INVASIVE CV LAB;  Service: Cardiovascular;  Laterality: N/A;   LEFT HEART CATH AND CORONARY ANGIOGRAPHY N/A 04/28/2020   Procedure: LEFT HEART CATH AND CORONARY ANGIOGRAPHY;  Surgeon: Lennette Bihari, MD;  Location: [facility] INVASIVE CV LAB;  Service: Cardiovascular;  Laterality: N/A;   MULTIPLE TOOTH EXTRACTIONS  2017   TONSILLECTOMY  1963   VENTRAL  HERNIA REPAIR  09/04/2011   Procedure: LAPAROSCOPIC VENTRAL HERNIA;  Surgeon: Atilano Ina, MD,FACS;  Location: [facility] ORS;  Service: General;  Laterality: N/A;  LAPAROSCOPIC incarcerated VENTRAL HERNIA  repair     Social History   Socioeconomic History   Marital status: Widowed    Spouse name: Not on file   Number of children: Not on file   Years of education: Not on file   Highest education level: Not on file  Occupational History   Not on file  Tobacco Use   Smoking status: Former    Packs/day: 0.50    Years: 42.00    Additional pack years: 0.00    Total pack years: 21.00    Types: Cigarettes    Quit date: 09/08/2019    Years since quitting: 2.8   Smokeless tobacco: Never  Vaping Use   Vaping Use: Every day   Substances: Nicotine, Flavoring  Substance and Sexual Activity   Alcohol use: No   Drug use: No   Sexual activity: Not on file  Other Topics Concern   Not on file  Social History Narrative   Not on file   Social Determinants of Health   Financial Resource Strain: Not on file  Food Insecurity: Not on file  Transportation Needs: Not on file  Physical Activity: Not on file  Stress: Not on file  Social Connections: Not on file  Intimate Partner Violence: Not on file     No Known Allergies   Outpatient Medications Prior to Visit  Medication Sig Dispense Refill   albuterol (VENTOLIN HFA) 108 (90 Base) MCG/ACT inhaler Inhale 2 puffs into the lungs every 6 (six) hours as needed for wheezing or shortness of breath. 8 g 6   atorvastatin (LIPITOR) 80 MG tablet TAKE 1 TABLET(80 MG) BY MOUTH DAILY 90 tablet 2   blood glucose meter kit and supplies KIT Dispense based on patient and insurance preference. Use up to four times daily as directed. 1 each 0   clopidogrel (PLAVIX) 75 MG tablet Take 1 tablet (75 mg total) by mouth daily. 90 tablet 3   DULoxetine (CYMBALTA) 60 MG capsule Take 1 capsule by mouth daily.     ELIQUIS 5 MG TABS tablet TAKE 1 TABLET(5 MG) BY MOUTH TWICE  DAILY 60 tablet 5   ezetimibe (ZETIA) 10 MG tablet Take 1 tablet (10 mg total) by mouth daily. 90 tablet 1   JARDIANCE 25 MG TABS tablet Take 25 mg by mouth daily.     metFORMIN (GLUCOPHAGE) 500 MG tablet Take 1,000 mg by mouth 2 (two) times daily with a meal.     metoprolol tartrate (LOPRESSOR) 25 MG tablet Take 1 tablet (25 mg total) by mouth 2 (two) times daily. 180 tablet 3   nitroGLYCERIN (NITROSTAT) 0.4 MG SL tablet Place 1 tablet (0.4 mg  total) under the tongue every 5 (five) minutes x 3 doses as needed for chest pain. 25 tablet 12   omeprazole (PRILOSEC) 40 MG capsule Take 40 mg by mouth daily.  1   valsartan (DIOVAN) 320 MG tablet Take 1 tablet (320 mg total) by mouth daily. 90 tablet 3   No facility-administered medications prior to visit.       Objective:   Physical Exam:  General appearance: 77 y.o., female, NAD, conversant  Eyes: anicteric sclerae; PERRL, tracking appropriately HENT: NCAT; MMM Neck: Trachea midline; no lymphadenopathy, no JVD Lungs: CTAB, no crackles, no wheeze, with normal respiratory effort CV: RRR, no murmur  Abdomen: Soft, non-tender; non-distended, BS present  Extremities: No peripheral edema, warm Skin: Normal turgor and texture; no rash Psych: Appropriate affect Neuro: Alert and oriented to person and place, no focal deficit     Vitals:   07/28/22 1334  BP: 112/70  Pulse: 69  Temp: 98.1 F (36.7 C)  TempSrc: Oral  SpO2: 94%  Weight: 183 lb 6.4 oz (83.2 kg)  Height: 5\' 3"  (1.6 m)     94% on RA BMI Readings from Last 3 Encounters:  07/28/22 32.49 kg/m  06/20/22 32.28 kg/m  01/15/22 32.70 kg/m   Wt Readings from Last 3 Encounters:  07/28/22 183 lb 6.4 oz (83.2 kg)  06/20/22 182 lb 3.2 oz (82.6 kg)  01/15/22 184 lb 9.6 oz (83.7 kg)     CBC    Component Value Date/Time   WBC 12.8 (H) 11/04/2021 0247   RBC 5.31 (H) 11/04/2021 0247   HGB 12.7 11/04/2021 0247   HCT 41.5 11/04/2021 0247   PLT 498 (H) 11/04/2021 0247    MCV 78.2 (L) 11/04/2021 0247   MCH 23.9 (L) 11/04/2021 0247   MCHC 30.6 11/04/2021 0247   RDW 18.3 (H) 11/04/2021 0247   LYMPHSABS 1.8 04/28/2020 0701   MONOABS 1.3 (H) 04/28/2020 0701   EOSABS 0.1 04/28/2020 0701   BASOSABS 0.0 04/28/2020 0701      Chest Imaging: CXR 11/04/21 reviewed by me unremarkable, stable nodule  LDCT Chest 11/05/20 reviewed by me with stable dominant LLL 1cm nodule  LDCT Chest 02/22/22 reviewed by me stable dominant LLL nodule  Pulmonary Functions Testing Results:    Latest Ref Rng & Units 01/15/2022   12:58 PM  PFT Results  FVC-Pre L 3.09  P  FVC-Predicted Pre % 116  P  FVC-Post L 3.05  P  FVC-Predicted Post % 115  P  Pre FEV1/FVC % % 86  P  Post FEV1/FCV % % 88  P  FEV1-Pre L 2.65  P  FEV1-Predicted Pre % 133  P  FEV1-Post L 2.69  P    P Preliminary result  PFT 01/15/22 normal pre/post spiro  Echocardiogram:   TTE 04/2020:  1. Left ventricular ejection fraction, by estimation, is 50 to 55%. The  left ventricle has low normal function. The left ventricle demonstrates  regional wall motion abnormalities (see scoring diagram/findings for  description). Akinesis of apex. There is   mild left ventricular hypertrophy. Left ventricular diastolic parameters  are indeterminate.   2. Right ventricular systolic function is normal. The right ventricular  size is normal. Tricuspid regurgitation signal is inadequate for assessing  PA pressure.   3. The mitral valve is abnormal. Trivial mitral valve regurgitation. No  evidence of mitral stenosis. Moderate mitral annular calcification.   4. The aortic valve is tricuspid. Aortic valve regurgitation is not  visualized. Mild aortic valve sclerosis is  present, with no evidence of  aortic valve stenosis.   5. Aortic dilatation noted. There is mild dilatation of the ascending  aorta, measuring 38 mm.   6. The inferior vena cava is normal in size with greater than 50%  respiratory variability, suggesting right  atrial pressure of 3 mmHg.   Heart Catheterization 04/28/20:   Dist Cx lesion is 30% stenosed. 1st Mrg lesion is 20% stenosed. Prox RCA lesion is 10% stenosed. Dist LAD lesion is 100% stenosed. Post intervention, there is a 0% residual stenosis. Prox LAD to Mid LAD lesion is 80% stenosed. The left ventricular systolic function is normal. LV end diastolic pressure is normal. The left ventricular ejection fraction is greater than 65% by visual estimate. Acute ST segment elevation myocardial infarction secondary to thrombotic subtotal occlusion within the previously placed proximal LAD stent with probable apical distal embolization.   Mild concomitant nonobstructive CAD involving the left circumflex 20% mild narrowings in the OM1 vessel and AV groove and mild irregularity in the mid RCA.   Successful PCI to the thrombotic lesion in the previously placed Xience 3.5 x 15 mm stent treated with pliant balloon dilatation up to 3.5 mm.  Residual stenosis 0 with brisk TIMI-3 flow.  However there is still probable abrupt cut off at the very apex of the LAD due to initial thrombus embolization   Hyperdynamic LV function with EF estimate greater than 65% without focal wall motion abnormality. LVEDP 12 mm.   RECOMMENDATION: DAPT indefinitely in this patient who had previously undergone stenting of her proximal LAD with subsequent Plavix discontinuation.  Continue Aggrastat 18 hours post procedure.  Aggressive lipid-lowering therapy and optimal blood pressure control.  Smoking cessation is essential.    Assessment & Plan:   # DOE May be multifactorial. Does have mild emphysema and fibrosis (though fibrosis does not appear to be progressive), spiro without obstruction. No response symptomatically to stiolto. Relatively minor at present.  # Pulmonary nodule   # Snoring # Excessive daytime sleepiness  # History of smoking  Plan: - if getting recurrent bouts of bronchitis or pneumonia that respond  well to steroids or antibiotics then come back and see [place] - if you think about it and want to pursue biopsy of left lower lobe pulmonary nodule let us know and we can arrange it. I also think based on your values it's reasonable to wait on another ct scan to look for any concerning change first.  - she has held off on scheduling HST    Omar Person, MD [facility] 07/28/2022 2:25 PM

## 2022-07-28 ENCOUNTER — Ambulatory Visit: Payer: Medicare Other | Admitting: Student

## 2022-07-28 ENCOUNTER — Encounter: Payer: Self-pay | Admitting: Student

## 2022-07-28 VITALS — BP 112/70 | HR 69 | Temp 98.1°F | Ht 63.0 in | Wt 183.4 lb

## 2022-07-28 DIAGNOSIS — R0609 Other forms of dyspnea: Secondary | ICD-10-CM | POA: Diagnosis not present

## 2022-07-28 DIAGNOSIS — R911 Solitary pulmonary nodule: Secondary | ICD-10-CM

## 2022-07-28 DIAGNOSIS — Z87891 Personal history of nicotine dependence: Secondary | ICD-10-CM | POA: Diagnosis not present

## 2022-07-28 NOTE — Patient Instructions (Signed)
-   if getting recurrent bouts of bronchitis or pneumonia that respond well to steroids or antibiotics then come back and see [place] - if you think about it and want to pursue biopsy of left lower lobe pulmonary nodule let us know and we can arrange it. I also think based on your values it's reasonable to wait on another ct scan to look for any concerning change first.

## 2022-07-28 NOTE — Addendum Note (Signed)
Addended by: Omar Person on: 07/28/2022 02:28 PM   Modules accepted: Orders

## 2022-08-07 ENCOUNTER — Ambulatory Visit (HOSPITAL_BASED_OUTPATIENT_CLINIC_OR_DEPARTMENT_OTHER): Payer: Medicare Other | Admitting: Cardiology

## 2022-08-14 DIAGNOSIS — I110 Hypertensive heart disease with heart failure: Secondary | ICD-10-CM | POA: Diagnosis not present

## 2022-08-14 DIAGNOSIS — Z79899 Other long term (current) drug therapy: Secondary | ICD-10-CM | POA: Diagnosis not present

## 2022-08-14 DIAGNOSIS — I25119 Atherosclerotic heart disease of native coronary artery with unspecified angina pectoris: Secondary | ICD-10-CM | POA: Diagnosis not present

## 2022-08-14 DIAGNOSIS — E1142 Type 2 diabetes mellitus with diabetic polyneuropathy: Secondary | ICD-10-CM | POA: Diagnosis not present

## 2022-08-14 DIAGNOSIS — E1136 Type 2 diabetes mellitus with diabetic cataract: Secondary | ICD-10-CM | POA: Diagnosis not present

## 2022-08-14 DIAGNOSIS — E611 Iron deficiency: Secondary | ICD-10-CM | POA: Diagnosis not present

## 2022-08-14 DIAGNOSIS — E1169 Type 2 diabetes mellitus with other specified complication: Secondary | ICD-10-CM | POA: Diagnosis not present

## 2022-08-15 ENCOUNTER — Telehealth (HOSPITAL_BASED_OUTPATIENT_CLINIC_OR_DEPARTMENT_OTHER): Payer: Self-pay

## 2022-08-15 NOTE — Telephone Encounter (Signed)
Ok to hold until [facility] evaluation clears her to restart.

## 2022-08-15 NOTE — Telephone Encounter (Signed)
2nd call attempt, MD not back in office at this time, fax with recommendations sent.

## 2022-08-15 NOTE — Telephone Encounter (Signed)
Returned call to MD office, he is at lunch, asked to call back around 2.

## 2022-08-15 NOTE — Telephone Encounter (Signed)
Call transferred from patient PCP,   Dr. Docia Chuck,   He saw Kathryn Bruce yesterday, HGB 9, concerns for continuing Eliquis, blood in stool. He wants okay from Dr. Cristal Deer to stop the patients Eliquis.   [phone] to call back with our decision.

## 2022-08-18 DIAGNOSIS — K921 Melena: Secondary | ICD-10-CM | POA: Diagnosis not present

## 2022-09-19 DIAGNOSIS — E611 Iron deficiency: Secondary | ICD-10-CM | POA: Diagnosis not present

## 2022-09-22 DIAGNOSIS — D6869 Other thrombophilia: Secondary | ICD-10-CM | POA: Diagnosis not present

## 2022-09-22 DIAGNOSIS — Z7901 Long term (current) use of anticoagulants: Secondary | ICD-10-CM | POA: Diagnosis not present

## 2022-09-24 ENCOUNTER — Emergency Department (HOSPITAL_BASED_OUTPATIENT_CLINIC_OR_DEPARTMENT_OTHER)
Admission: EM | Admit: 2022-09-24 | Discharge: 2022-09-24 | Disposition: A | Discharge status: Home / Self Care | Payer: Medicare Other | Attending: Emergency Medicine | Admitting: Emergency Medicine

## 2022-09-24 ENCOUNTER — Emergency Department (HOSPITAL_BASED_OUTPATIENT_CLINIC_OR_DEPARTMENT_OTHER): Payer: Medicare Other

## 2022-09-24 ENCOUNTER — Other Ambulatory Visit: Payer: Self-pay

## 2022-09-24 DIAGNOSIS — Z79899 Other long term (current) drug therapy: Secondary | ICD-10-CM | POA: Insufficient documentation

## 2022-09-24 DIAGNOSIS — R519 Headache, unspecified: Secondary | ICD-10-CM | POA: Diagnosis not present

## 2022-09-24 DIAGNOSIS — I10 Essential (primary) hypertension: Secondary | ICD-10-CM | POA: Insufficient documentation

## 2022-09-24 DIAGNOSIS — W01198A Fall on same level from slipping, tripping and stumbling with subsequent striking against other object, initial encounter: Secondary | ICD-10-CM | POA: Diagnosis not present

## 2022-09-24 DIAGNOSIS — M542 Cervicalgia: Secondary | ICD-10-CM | POA: Insufficient documentation

## 2022-09-24 DIAGNOSIS — S0990XA Unspecified injury of head, initial encounter: Secondary | ICD-10-CM | POA: Insufficient documentation

## 2022-09-24 DIAGNOSIS — W19XXXA Unspecified fall, initial encounter: Secondary | ICD-10-CM

## 2022-09-24 DIAGNOSIS — Z7901 Long term (current) use of anticoagulants: Secondary | ICD-10-CM | POA: Insufficient documentation

## 2022-09-24 DIAGNOSIS — S199XXA Unspecified injury of neck, initial encounter: Secondary | ICD-10-CM | POA: Diagnosis not present

## 2022-09-24 DIAGNOSIS — Z955 Presence of coronary angioplasty implant and graft: Secondary | ICD-10-CM | POA: Diagnosis not present

## 2022-09-24 DIAGNOSIS — S0003XA Contusion of scalp, initial encounter: Secondary | ICD-10-CM | POA: Diagnosis not present

## 2022-09-24 MED ORDER — KETOROLAC TROMETHAMINE 60 MG/2ML IM SOLN
30.0000 mg | Freq: Once | INTRAMUSCULAR | Status: AC
  Administered 2022-09-24: 30 mg via INTRAMUSCULAR
  Filled 2022-09-24 (×2): qty 2

## 2022-09-24 MED ORDER — OXYCODONE-ACETAMINOPHEN 5-325 MG PO TABS
2.0000 | ORAL_TABLET | Freq: Once | ORAL | Status: AC
  Administered 2022-09-24: 2 via ORAL
  Filled 2022-09-24: qty 2

## 2022-09-24 NOTE — ED Provider Notes (Signed)
[place] EMERGENCY DEPARTMENT AT [facility] Provider Note   CSN: [id number] Arrival date & time: 09/24/22  0441     History  Chief Complaint  Patient presents with   Kathryn Bruce    Kathryn Bruce is a 77 y.o. female.  77 year old female with history of A-fib and a stent on Eliquis and Plavix that presents the ER today with a fall.  States she was normal state of health and just lost her balance fell backwards and now she has head and neck pain.  No loss of consciousness, emesis, neurologic changes.  She states right when she hit her head she had a brief episode of a bright light in her left eye but no vision changes since then.   Fall       Home Medications Prior to Admission medications   Medication Sig Start Date End Date Taking? Authorizing Provider  albuterol (VENTOLIN HFA) 108 (90 Base) MCG/ACT inhaler Inhale 2 puffs into the lungs every 6 (six) hours as needed for wheezing or shortness of breath. 11/13/21   Omar Person, MD  atorvastatin (LIPITOR) 80 MG tablet TAKE 1 TABLET(80 MG) BY MOUTH DAILY 01/23/22   Jodelle Red, MD  blood glucose meter kit and supplies KIT Dispense based on patient and insurance preference. Use up to four times daily as directed. 05/17/20   Jodelle Red, MD  clopidogrel (PLAVIX) 75 MG tablet Take 1 tablet (75 mg total) by mouth daily. 11/06/21   Alver Sorrow, NP  DULoxetine (CYMBALTA) 60 MG capsule Take 1 capsule by mouth daily. 01/18/20   [provider]  ELIQUIS 5 MG TABS tablet TAKE 1 TABLET(5 MG) BY MOUTH TWICE DAILY 07/14/22   Jodelle Red, MD  ezetimibe (ZETIA) 10 MG tablet Take 1 tablet (10 mg total) by mouth daily. 06/16/22   Jodelle Red, MD  JARDIANCE 25 MG TABS tablet Take 25 mg by mouth daily. 11/01/21   [provider]  metFORMIN (GLUCOPHAGE) 500 MG tablet Take 1,000 mg by mouth 2 (two) times daily with a meal.    [provider]  metoprolol tartrate (LOPRESSOR)  25 MG tablet Take 1 tablet (25 mg total) by mouth 2 (two) times daily. 11/06/21   Alver Sorrow, NP  nitroGLYCERIN (NITROSTAT) 0.4 MG SL tablet Place 1 tablet (0.4 mg total) under the tongue every 5 (five) minutes x 3 doses as needed for chest pain. 11/29/19   Jodelle Red, MD  omeprazole (PRILOSEC) 40 MG capsule Take 40 mg by mouth daily. 08/24/17   [provider]  valsartan (DIOVAN) 320 MG tablet Take 1 tablet (320 mg total) by mouth daily. 06/20/22   Jodelle Red, MD      Allergies    Patient has no known allergies.    Review of Systems   Review of Systems  Physical Exam Updated Vital Signs BP (!) 160/69   Pulse 62   Temp (!) 97.3 F (36.3 C)   Resp 18   Ht 5\' 3"  (1.6 m)   Wt 83 kg   SpO2 (!) 87%   BMI 32.42 kg/m  Physical Exam Vitals and nursing note reviewed.  Constitutional:      Appearance: She is well-developed.  HENT:     Head: Normocephalic.     Comments: Hematoma to posterior scalp.  Cardiovascular:     Rate and Rhythm: Normal rate and regular rhythm.  Pulmonary:     Effort: No respiratory distress.     Breath sounds: No stridor.  Abdominal:  General: There is no distension.  Musculoskeletal:     Cervical back: Normal range of motion.  Neurological:     Mental Status: She is alert.     ED Results / Procedures / Treatments   Labs (all labs ordered are listed, but only abnormal results are displayed) Labs Reviewed - No data to display  EKG None  Radiology CT Head Wo Contrast  Result Date: 09/24/2022 CLINICAL DATA:  Minor head trauma. Left-sided head neck pain after fall. EXAM: CT HEAD WITHOUT CONTRAST CT CERVICAL SPINE WITHOUT CONTRAST TECHNIQUE: Multidetector CT imaging of the head and cervical spine was performed following the standard protocol without intravenous contrast. Multiplanar CT image reconstructions of the cervical spine were also generated. RADIATION DOSE REDUCTION: This exam was performed according to the  departmental dose-optimization program which includes automated exposure control, adjustment of the mA and/or kV according to patient size and/or use of iterative reconstruction technique. COMPARISON:  None Available. FINDINGS: CT HEAD FINDINGS Brain: No evidence of acute infarction, hemorrhage, hydrocephalus, extra-axial collection or mass lesion/mass effect. Chronic small vessel ischemia in the deep cerebral white matter to a mild or moderate degree. Mild for age cerebral volume loss Vascular: No hyperdense vessel or unexpected calcification. Skull: Posterior scalp contusion.  No acute fracture Sinuses/Orbits: No evidence of injury CT CERVICAL SPINE FINDINGS Alignment: Normal. Skull base and vertebrae: No acute fracture. No primary bone lesion or focal pathologic process. Soft tissues and spinal canal: No prevertebral fluid or swelling. No visible canal hematoma. Disc levels:  Limited degenerative changes for age. Upper chest: No visible injury IMPRESSION: 1. No evidence of intracranial or cervical spine injury. 2. Posterior scalp swelling without fracture. Electronically Signed   By: Tiburcio Pea M.D.   On: 09/24/2022 05:23   CT Cervical Spine Wo Contrast  Result Date: 09/24/2022 CLINICAL DATA:  Minor head trauma. Left-sided head neck pain after fall. EXAM: CT HEAD WITHOUT CONTRAST CT CERVICAL SPINE WITHOUT CONTRAST TECHNIQUE: Multidetector CT imaging of the head and cervical spine was performed following the standard protocol without intravenous contrast. Multiplanar CT image reconstructions of the cervical spine were also generated. RADIATION DOSE REDUCTION: This exam was performed according to the departmental dose-optimization program which includes automated exposure control, adjustment of the mA and/or kV according to patient size and/or use of iterative reconstruction technique. COMPARISON:  None Available. FINDINGS: CT HEAD FINDINGS Brain: No evidence of acute infarction, hemorrhage, hydrocephalus,  extra-axial collection or mass lesion/mass effect. Chronic small vessel ischemia in the deep cerebral white matter to a mild or moderate degree. Mild for age cerebral volume loss Vascular: No hyperdense vessel or unexpected calcification. Skull: Posterior scalp contusion.  No acute fracture Sinuses/Orbits: No evidence of injury CT CERVICAL SPINE FINDINGS Alignment: Normal. Skull base and vertebrae: No acute fracture. No primary bone lesion or focal pathologic process. Soft tissues and spinal canal: No prevertebral fluid or swelling. No visible canal hematoma. Disc levels:  Limited degenerative changes for age. Upper chest: No visible injury IMPRESSION: 1. No evidence of intracranial or cervical spine injury. 2. Posterior scalp swelling without fracture. Electronically Signed   By: Tiburcio Pea M.D.   On: 09/24/2022 05:23    Procedures Procedures    Medications Ordered in ED Medications  oxyCODONE-acetaminophen (PERCOCET/ROXICET) 5-325 MG per tablet 2 tablet (2 tablets Oral Given 09/24/22 0518)  ketorolac (TORADOL) injection 30 mg (30 mg Intramuscular Given 09/24/22 0601)    ED Course/ Medical Decision Making/ A&P  Medical Decision Making Amount and/or Complexity of Data Reviewed Radiology: ordered.  Risk Prescription drug management.   ET head neck interpreted reviewed by myself without obvious fracture or bleed.  Will ambulate patient.  Patient apparently ambulated on the way in here so I have low suspicion for any other occult fractures.  Will apply some ice to this scalp and then also treat symptomatically.  No indication for further workup at this time. Ambulated at baseline. BP improved with pain control. Documented low O2 just prior to discharge may be related to pain meds or an error unfortunately this was not seen prior to discharge, rest were normal so unlikely to have a primary lung problem without any complaints related to same.   Final Clinical  Impression(s) / ED Diagnoses Final diagnoses:  Fall, initial encounter  Hypertension, unspecified type    Rx / DC Orders ED Discharge Orders     None         Georgena Weisheit, Barbara Cower, MD 09/24/22 [id number]

## 2022-09-24 NOTE — ED Triage Notes (Signed)
POV from home, A&O x 4, GCS 15, amb to room  Pt sts that she lost balance and fell onto hard wood floors, c/o left sided head pain/neck pain, denies LOC. Takes elliquis and plavix.

## 2022-10-24 DIAGNOSIS — Z1231 Encounter for screening mammogram for malignant neoplasm of breast: Secondary | ICD-10-CM | POA: Diagnosis not present

## 2022-11-04 DIAGNOSIS — H35363 Drusen (degenerative) of macula, bilateral: Secondary | ICD-10-CM | POA: Diagnosis not present

## 2022-11-04 DIAGNOSIS — H25813 Combined forms of age-related cataract, bilateral: Secondary | ICD-10-CM | POA: Diagnosis not present

## 2022-11-04 DIAGNOSIS — H353131 Nonexudative age-related macular degeneration, bilateral, early dry stage: Secondary | ICD-10-CM | POA: Diagnosis not present

## 2022-11-04 DIAGNOSIS — E119 Type 2 diabetes mellitus without complications: Secondary | ICD-10-CM | POA: Diagnosis not present

## 2022-11-23 ENCOUNTER — Other Ambulatory Visit (HOSPITAL_BASED_OUTPATIENT_CLINIC_OR_DEPARTMENT_OTHER): Payer: Self-pay | Admitting: Family

## 2022-12-03 ENCOUNTER — Encounter (HOSPITAL_BASED_OUTPATIENT_CLINIC_OR_DEPARTMENT_OTHER): Payer: Self-pay | Admitting: Cardiology

## 2022-12-03 ENCOUNTER — Ambulatory Visit (HOSPITAL_BASED_OUTPATIENT_CLINIC_OR_DEPARTMENT_OTHER): Payer: Medicare Other | Admitting: Cardiology

## 2022-12-03 VITALS — BP 138/86 | HR 62 | Ht 63.0 in | Wt 179.0 lb

## 2022-12-03 DIAGNOSIS — I480 Paroxysmal atrial fibrillation: Secondary | ICD-10-CM

## 2022-12-03 DIAGNOSIS — R5382 Chronic fatigue, unspecified: Secondary | ICD-10-CM

## 2022-12-03 DIAGNOSIS — Z7901 Long term (current) use of anticoagulants: Secondary | ICD-10-CM

## 2022-12-03 DIAGNOSIS — I2510 Atherosclerotic heart disease of native coronary artery without angina pectoris: Secondary | ICD-10-CM | POA: Diagnosis not present

## 2022-12-03 DIAGNOSIS — I10 Essential (primary) hypertension: Secondary | ICD-10-CM

## 2022-12-03 DIAGNOSIS — I252 Old myocardial infarction: Secondary | ICD-10-CM

## 2022-12-03 DIAGNOSIS — E782 Mixed hyperlipidemia: Secondary | ICD-10-CM

## 2022-12-03 DIAGNOSIS — D6859 Other primary thrombophilia: Secondary | ICD-10-CM

## 2022-12-03 NOTE — Progress Notes (Signed)
Cardiology Office Note:  .    Date:  12/03/2022  ID:  Kathryn Bruce, DOB 11-12-1945, MRN [id number] PCP: Darrow Bussing, MD  [facility] Providers Cardiologist:  Jodelle Red, MD     History of Present Illness: .    Kathryn Bruce is a 77 y.o. female with a history of paroxysmal atrial fibrillation, CAD with NSTEMI in 09/2017 s/p DES to proximal LAD followed by STEMI 04/2020 due to in stent thrombosis, hypertension, hyperlipidemia, type 2 diabetes mellitus, obesity, and prior tobacco use. Presents here for follow up.   At her visit 06/2022, she was pending GI consult for melena. She was taking apixaban and clopidogrel. Her blood pressure was much better controlled at home than in the office. No very high blood pressure since changing to valsartan; was tolerating 320 mg dose. Had rare dizziness with changing position. On 09/24/2022 she presented to the ER following a fall due to a loss of balance. Noted a brief episode of a bright light in her left eye upon striking her head but with no further vision changes. Head and neck CT without obvious fracture or bleed. She was treated symptomatically; blood pressure improved with pain control.   Today, she reports struggling with daytime somnolence. Just about every day she is falling asleep easily such as after she sits down. When lying down at night, she falls asleep quickly. She believes her sleep quality is normal. Usually needs to wake up to use the restroom once during the night. When she first woke up this morning around 5:30 AM, she used the restroom and then returned to sitting on the side of her bed. She then felt dizzy, lied down and fell back asleep.  While walking around the house she will use her walker for support. In addition to her fall prompting her ER visit, she had another fall onto the dirt and grass while working in her garden. After about 15-20 minutes of activity she begins to experience back pain and needs to sit and  rest.  In the office her blood pressure is 146/88 initially which she attributes to rushing to her appointment today. On manual recheck her BP improved to 138/86. At home her blood pressures have been more controlled. At one point it was 108/64 but she was asymptomatic at the time.  Occasionally she complains of a squeezing left chest pain that she feels is musculoskeletal in nature. This does not occur very often, maybe twice a month.  She has noted some swelling in her legs, mostly localized to her right calf and shin, sometimes in her left leg. There is no associated pain and her swelling improves with leg elevation. She is completing chair exercises a couple times per day.  She confirms that she had a colonoscopy/endoscopy, and was referred to hematology. Her bowel movements are sometimes normal and sometimes loose.  She denies any palpitations, shortness of breath, lightheadedness, headaches, syncope, orthopnea, or PND.  ROS:  Please see the history of present illness. ROS otherwise negative except as noted.  (+) Daytime somnolence (+) Fatigue (+) Dizziness (+) Occasional left chest pain (+) Intermittent LE edema  Studies Reviewed: Marland Kitchen           Physical Exam:    VS:  BP 138/86 (BP Location: Left Arm, Patient Position: Sitting, Cuff Size: Normal)   Pulse 62   Ht 5\' 3"  (1.6 m)   Wt 179 lb (81.2 kg)   SpO2 98%   BMI 31.71 kg/m  Wt Readings from Last 3 Encounters:  12/03/22 179 lb (81.2 kg)  09/24/22 183 lb (83 kg)  07/28/22 183 lb 6.4 oz (83.2 kg)    GEN: Well nourished, well developed in no acute distress HEENT: Normal, moist mucous membranes NECK: No JVD CARDIAC: regular rhythm, normal S1 and S2, no rubs or gallops. No murmur. VASCULAR: Radial and DP pulses 2+ bilaterally. No carotid bruits RESPIRATORY:  Clear to auscultation without rales, wheezing or rhonchi  ABDOMEN: Soft, non-tender, non-distended MUSCULOSKELETAL:  Ambulates independently SKIN: Warm and dry, no  edema NEUROLOGIC:  Alert and oriented x 3. No focal neuro deficits noted. PSYCHIATRIC:  Normal affect   ASSESSMENT AND PLAN: .    Fatigue: History of anemia, reports no recent change in GI symptoms Will check labs (CBC, thyroid, CMET)   Hypertension:  -continue valsartan 320 mg daily -continue metoprolol 25 mg BID. Alternatively, if BP control needed, could change to carvedilol   Atrial fibrillation, paroxysmal -CHA2DS2/VAS Stroke Risk Points=6  -on apixaban for hypercoagulable state -dropped to single antiplatelet with start of anticoagulation, see below   CAD with prior PCI for NSTEMI and then STEMI, aortic atherosclerosis: -changed from ticagrelor to clopidogrel with start of anticoagulation -with history of stent thrombosis, would not hold antiplatelet if at all possible -continue atorvastatin 80 mg -counseled on red flag warning signs that need immediate medical attention   Tobacco use: no longer smoking cigarettes, discussed avoiding vaping   HLD: goal LDL <55 given diabetes -continue both atorvastatin and ezetimibe -LDL last 84, prior was at goal. Recheck at follow up   Type II diabetes, with CAD history -tolerating Jardiance, metformin   Cardiac risk counseling and prevention recommendations: -recommend heart healthy/Mediterranean diet, with whole grains, fruits, vegetable, fish, lean meats, nuts, and olive oil. Limit salt. -recommend moderate walking, 3-5 times/week for 30-50 minutes each session. Aim for at least 150 minutes.week. Goal should be pace of 3 miles/hours, or walking 1.5 miles in 30 minutes -recommend avoidance of tobacco products. Avoid excess alcohol.  Dispo: Follow-up in 6 months, or sooner as needed.  I,Mathew Stumpf,acting as a Neurosurgeon for [employer], MD.,have documented all relevant documentation on the behalf of Jodelle Red, MD,as directed by  Jodelle Red, MD while in the presence of Jodelle Red, MD.  I,  Jodelle Red, MD, have reviewed all documentation for this visit. The documentation on 12/03/22 for the exam, diagnosis, procedures, and orders are all accurate and complete.   Signed, Jodelle Red, MD

## 2022-12-03 NOTE — Patient Instructions (Signed)
Medication Instructions:  Your physician recommends that you continue on your current medications as directed. Please refer to the Current Medication list given to you today.  *If you need a refill on your cardiac medications before your next appointment, please call your pharmacy*   Lab Work: Labs today   Follow-Up: At [facility], you and your health needs are our priority.  As part of our continuing mission to provide you with exceptional heart care, we have created designated Provider Care Teams.  These Care Teams include your primary Cardiologist (physician) and Advanced Practice Providers (APPs -  Physician Assistants and Nurse Practitioners) who all work together to provide you with the care you need, when you need it.  We recommend signing up for the patient portal called "MyChart".  Sign up information is provided on this After Visit Summary.  MyChart is used to connect with patients for Virtual Visits (Telemedicine).  Patients are able to view lab/test results, encounter notes, upcoming appointments, etc.  Non-urgent messages can be sent to your provider as well.   To learn more about what you can do with MyChart, go to [email or web address].    Your next appointment:   6 months with Dr. Cristal Deer

## 2022-12-04 LAB — CBC
Hematocrit: 43.2 % (ref 34.0–46.6)
Hemoglobin: 13.2 g/dL (ref 11.1–15.9)
MCH: 25 pg — ABNORMAL LOW (ref 26.6–33.0)
MCHC: 30.6 g/dL — ABNORMAL LOW (ref 31.5–35.7)
MCV: 82 fL (ref 79–97)
Platelets: 418 10*3/uL (ref 150–450)
RBC: 5.29 x10E6/uL — ABNORMAL HIGH (ref 3.77–5.28)
RDW: 20.5 % — ABNORMAL HIGH (ref 11.7–15.4)
WBC: 9.2 10*3/uL (ref 3.4–10.8)

## 2022-12-04 LAB — COMPREHENSIVE METABOLIC PANEL
ALT: 9 IU/L (ref 0–32)
AST: 14 IU/L (ref 0–40)
Albumin: 4.2 g/dL (ref 3.8–4.8)
Alkaline Phosphatase: 71 IU/L (ref 44–121)
BUN/Creatinine Ratio: 20 (ref 12–28)
BUN: 12 mg/dL (ref 8–27)
Bilirubin Total: 0.5 mg/dL (ref 0.0–1.2)
CO2: 24 mmol/L (ref 20–29)
Calcium: 9.4 mg/dL (ref 8.7–10.3)
Chloride: 104 mmol/L (ref 96–106)
Creatinine, Ser: 0.61 mg/dL (ref 0.57–1.00)
Globulin, Total: 1.9 g/dL (ref 1.5–4.5)
Glucose: 77 mg/dL (ref 70–99)
Potassium: 4.2 mmol/L (ref 3.5–5.2)
Sodium: 145 mmol/L — ABNORMAL HIGH (ref 134–144)
Total Protein: 6.1 g/dL (ref 6.0–8.5)
eGFR: 92 mL/min/{1.73_m2} (ref >59)

## 2022-12-04 LAB — TSH: TSH: 2 u[IU]/mL (ref 0.450–4.500)

## 2022-12-19 ENCOUNTER — Ambulatory Visit (HOSPITAL_BASED_OUTPATIENT_CLINIC_OR_DEPARTMENT_OTHER): Payer: Medicare Other | Admitting: Cardiology

## 2023-01-02 DIAGNOSIS — H25813 Combined forms of age-related cataract, bilateral: Secondary | ICD-10-CM | POA: Diagnosis not present

## 2023-01-02 DIAGNOSIS — H25812 Combined forms of age-related cataract, left eye: Secondary | ICD-10-CM | POA: Diagnosis not present

## 2023-01-02 DIAGNOSIS — H52213 Irregular astigmatism, bilateral: Secondary | ICD-10-CM | POA: Diagnosis not present

## 2023-01-24 ENCOUNTER — Other Ambulatory Visit: Payer: Self-pay | Admitting: Cardiology

## 2023-01-24 DIAGNOSIS — I480 Paroxysmal atrial fibrillation: Secondary | ICD-10-CM

## 2023-01-26 NOTE — Telephone Encounter (Signed)
Prescription refill request for Eliquis received. Indication:afib Last office visit:9/24 Scr:0.61  9/24 Age: 77 Weight:81.2  kg  Prescription refilled

## 2023-01-26 NOTE — Telephone Encounter (Signed)
Please assist with Eliquis refill.  Thank you!

## 2023-02-03 DIAGNOSIS — H268 Other specified cataract: Secondary | ICD-10-CM | POA: Diagnosis not present

## 2023-02-03 DIAGNOSIS — H25813 Combined forms of age-related cataract, bilateral: Secondary | ICD-10-CM | POA: Diagnosis not present

## 2023-02-03 DIAGNOSIS — H2511 Age-related nuclear cataract, right eye: Secondary | ICD-10-CM | POA: Diagnosis not present

## 2023-02-12 DIAGNOSIS — E1142 Type 2 diabetes mellitus with diabetic polyneuropathy: Secondary | ICD-10-CM | POA: Diagnosis not present

## 2023-02-12 DIAGNOSIS — Z0001 Encounter for general adult medical examination with abnormal findings: Secondary | ICD-10-CM | POA: Diagnosis not present

## 2023-02-12 DIAGNOSIS — Z23 Encounter for immunization: Secondary | ICD-10-CM | POA: Diagnosis not present

## 2023-02-12 DIAGNOSIS — H25812 Combined forms of age-related cataract, left eye: Secondary | ICD-10-CM | POA: Diagnosis not present

## 2023-02-12 DIAGNOSIS — Z1331 Encounter for screening for depression: Secondary | ICD-10-CM | POA: Diagnosis not present

## 2023-02-12 DIAGNOSIS — I110 Hypertensive heart disease with heart failure: Secondary | ICD-10-CM | POA: Diagnosis not present

## 2023-02-12 DIAGNOSIS — I10 Essential (primary) hypertension: Secondary | ICD-10-CM | POA: Diagnosis not present

## 2023-02-12 DIAGNOSIS — E1136 Type 2 diabetes mellitus with diabetic cataract: Secondary | ICD-10-CM | POA: Diagnosis not present

## 2023-02-12 DIAGNOSIS — Z79899 Other long term (current) drug therapy: Secondary | ICD-10-CM | POA: Diagnosis not present

## 2023-02-12 DIAGNOSIS — E611 Iron deficiency: Secondary | ICD-10-CM | POA: Diagnosis not present

## 2023-02-12 DIAGNOSIS — Z961 Presence of intraocular lens: Secondary | ICD-10-CM | POA: Diagnosis not present

## 2023-02-12 DIAGNOSIS — I700 Atherosclerosis of aorta: Secondary | ICD-10-CM | POA: Diagnosis not present

## 2023-02-19 DIAGNOSIS — I480 Paroxysmal atrial fibrillation: Secondary | ICD-10-CM | POA: Diagnosis not present

## 2023-02-19 DIAGNOSIS — Z7901 Long term (current) use of anticoagulants: Secondary | ICD-10-CM | POA: Diagnosis not present

## 2023-02-19 DIAGNOSIS — I10 Essential (primary) hypertension: Secondary | ICD-10-CM | POA: Diagnosis not present

## 2023-02-19 DIAGNOSIS — I2510 Atherosclerotic heart disease of native coronary artery without angina pectoris: Secondary | ICD-10-CM | POA: Diagnosis not present

## 2023-02-24 DIAGNOSIS — H25812 Combined forms of age-related cataract, left eye: Secondary | ICD-10-CM | POA: Diagnosis not present

## 2023-03-24 ENCOUNTER — Other Ambulatory Visit (HOSPITAL_BASED_OUTPATIENT_CLINIC_OR_DEPARTMENT_OTHER): Payer: Self-pay | Admitting: Cardiology

## 2023-04-07 ENCOUNTER — Emergency Department (HOSPITAL_BASED_OUTPATIENT_CLINIC_OR_DEPARTMENT_OTHER)
Admission: EM | Admit: 2023-04-07 | Discharge: 2023-04-07 | Disposition: A | Discharge status: Home / Self Care | Payer: Medicare Other | Attending: Emergency Medicine | Admitting: Emergency Medicine

## 2023-04-07 ENCOUNTER — Other Ambulatory Visit: Payer: Self-pay

## 2023-04-07 DIAGNOSIS — Z7902 Long term (current) use of antithrombotics/antiplatelets: Secondary | ICD-10-CM | POA: Insufficient documentation

## 2023-04-07 DIAGNOSIS — R040 Epistaxis: Secondary | ICD-10-CM | POA: Insufficient documentation

## 2023-04-07 DIAGNOSIS — Z7901 Long term (current) use of anticoagulants: Secondary | ICD-10-CM | POA: Insufficient documentation

## 2023-04-07 NOTE — ED Provider Notes (Signed)
  [place] EMERGENCY DEPARTMENT AT [facility] Provider Note   CSN: [id number] Arrival date & time: 04/07/23  1747     History Chief Complaint  Patient presents with   Epistaxis    HPI Kathryn Bruce is a 78 y.o. female presenting for 4 hours of nosebleed.  On Plavix Eliquis.  States that it started after a large sneeze.Marland Kitchen   Patient's recorded medical, surgical, social, medication list and allergies were reviewed in the Snapshot window as part of the initial history.   Review of Systems   Review of Systems  Constitutional:  Negative for chills and fever.  HENT:  Negative for ear pain and sore throat.   Eyes:  Negative for pain and visual disturbance.  Respiratory:  Negative for cough and shortness of breath.   Cardiovascular:  Negative for chest pain and palpitations.  Gastrointestinal:  Negative for abdominal pain and vomiting.  Genitourinary:  Negative for dysuria and hematuria.  Musculoskeletal:  Negative for arthralgias and back pain.  Skin:  Negative for color change and rash.  Neurological:  Negative for seizures and syncope.  All other systems reviewed and are negative.   Physical Exam Updated Vital Signs BP 115/83   Pulse 72   Temp 97.9 F (36.6 C) (Oral)   Resp 17   Ht 5\' 3"  (1.6 m)   Wt 83.9 kg   SpO2 92%   BMI 32.77 kg/m  Physical Exam Vitals and nursing note reviewed.  Constitutional:      General: She is not in acute distress.    Appearance: She is well-developed.  HENT:     Head: Normocephalic and atraumatic.  Eyes:     Conjunctiva/sclera: Conjunctivae normal.  Cardiovascular:     Rate and Rhythm: Normal rate and regular rhythm.     Heart sounds: No murmur heard. Pulmonary:     Effort: Pulmonary effort is normal. No respiratory distress.     Breath sounds: Normal breath sounds.  Abdominal:     General: There is no distension.     Palpations: Abdomen is soft.     Tenderness: There is no abdominal tenderness. There is no right CVA  tenderness or left CVA tenderness.  Musculoskeletal:        General: No swelling or tenderness. Normal range of motion.     Cervical back: Neck supple.  Skin:    General: Skin is warm and dry.  Neurological:     General: No focal deficit present.     Mental Status: She is alert and oriented to person, place, and time. Mental status is at baseline.     Cranial Nerves: No cranial nerve deficit.      ED Course/ Medical Decision Making/ A&P    Procedures Procedures   Medications Ordered in ED Medications - No data to display  Medical Decision Making:   79 year old female presenting with epistaxis.  Resolved on arrival.  Educated patient on management in the future and provided nose clip as well as sterile gauze to use. Observed for 1 hour without further episodes patient feels comfortable outpatient care and management. Clinical Impression:  1. Epistaxis      Discharge   Final Clinical Impression(s) / ED Diagnoses Final diagnoses:  Epistaxis    Rx / DC Orders ED Discharge Orders     None         Glyn Ade, MD 04/07/23 [phone]

## 2023-04-07 NOTE — ED Notes (Signed)
RN reviewed discharge instructions with pt. Pt verbalized understanding and had no further questions. VSS upon discharge.

## 2023-04-07 NOTE — ED Triage Notes (Signed)
Pt caox4, ambulatory c/o nosebleed in L nostril that started approx 2-3 hrs ago after sneezing, pt on plavix and eliquis.

## 2023-05-20 DIAGNOSIS — E119 Type 2 diabetes mellitus without complications: Secondary | ICD-10-CM | POA: Diagnosis not present

## 2023-05-20 DIAGNOSIS — I480 Paroxysmal atrial fibrillation: Secondary | ICD-10-CM | POA: Diagnosis not present

## 2023-06-03 ENCOUNTER — Ambulatory Visit (HOSPITAL_BASED_OUTPATIENT_CLINIC_OR_DEPARTMENT_OTHER): Payer: Medicare Other | Admitting: Cardiology

## 2023-06-03 ENCOUNTER — Encounter (HOSPITAL_BASED_OUTPATIENT_CLINIC_OR_DEPARTMENT_OTHER): Payer: Self-pay | Admitting: Cardiology

## 2023-06-03 VITALS — BP 138/78 | HR 70 | Ht 63.0 in | Wt 198.3 lb

## 2023-06-03 DIAGNOSIS — Z7901 Long term (current) use of anticoagulants: Secondary | ICD-10-CM

## 2023-06-03 DIAGNOSIS — I10 Essential (primary) hypertension: Secondary | ICD-10-CM | POA: Diagnosis not present

## 2023-06-03 DIAGNOSIS — I480 Paroxysmal atrial fibrillation: Secondary | ICD-10-CM

## 2023-06-03 DIAGNOSIS — I252 Old myocardial infarction: Secondary | ICD-10-CM | POA: Diagnosis not present

## 2023-06-03 DIAGNOSIS — I2510 Atherosclerotic heart disease of native coronary artery without angina pectoris: Secondary | ICD-10-CM | POA: Diagnosis not present

## 2023-06-03 DIAGNOSIS — D6859 Other primary thrombophilia: Secondary | ICD-10-CM

## 2023-06-03 DIAGNOSIS — E782 Mixed hyperlipidemia: Secondary | ICD-10-CM

## 2023-06-03 NOTE — Progress Notes (Signed)
 Cardiology Office Note:  .    Date:  06/03/2023  ID:  Kathryn Bruce, DOB June 03, 1945, MRN [id number] PCP: Darrow Bussing, MD  [facility] Providers Cardiologist:  Jodelle Red, MD     History of Present Illness: .    Kathryn Bruce is a 78 y.o. female with a history of paroxysmal atrial fibrillation, CAD with NSTEMI in 09/2017 s/p DES to proximal LAD followed by STEMI 04/2020 due to in stent thrombosis, hypertension, hyperlipidemia, type 2 diabetes mellitus, obesity, and prior tobacco use. Presents here for follow up.  Today: Doing well overall. Has occasional leg swelling at the end of the day, mild. Energy level has ups and downs. Able to work in the yard for short periods of time. Cooks for herself, keeps the house tidy.  Has only had one brief episode of left shoulder tightness when lying in bed, lasted about 5 minutes, not severe. No other concerns for chest pain/angina. Rare palpitations.  Pending colonoscopy, going annually now. She denies melena or hematochezia.  With her history, do not want her off of all antiplatelets. In the past, we have held her apixaban and clopidogrel but had her take aspirin 81 mg daily while these are held prior to colonoscopy.   ROS:  Denies shortness of breath at rest or with normal exertion. No PND, orthopnea, LE edema or unexpected weight gain. No syncope. ROS otherwise negative except as noted.   Studies Reviewed: Marland Kitchen    EKG Interpretation Date/Time:  Wednesday June 03 2023 15:35:30 EDT Ventricular Rate:  62 PR Interval:  206 QRS Duration:  94 QT Interval:  400 QTC Calculation: 406 R Axis:   -27  Text Interpretation: Normal sinus rhythm low septal forces, unchanged Confirmed by Jodelle Red [phone]) on 06/03/2023 4:11:33 PM      Physical Exam:    VS:  BP 138/78   Pulse 70   Ht 5\' 3"  (1.6 m)   Wt 198 lb 4.8 oz (89.9 kg)   SpO2 94%   BMI 35.13 kg/m    Wt Readings from Last 3 Encounters:  06/03/23 198 lb 4.8 oz  (89.9 kg)  04/07/23 185 lb (83.9 kg)  12/03/22 179 lb (81.2 kg)    GEN: Well nourished, well developed in no acute distress HEENT: Normal, moist mucous membranes NECK: No JVD CARDIAC: regular rhythm, normal S1 and S2, no rubs or gallops. No murmur. VASCULAR: Radial and DP pulses 2+ bilaterally. No carotid bruits RESPIRATORY:  Clear to auscultation without rales, wheezing or rhonchi  ABDOMEN: Soft, non-tender, non-distended MUSCULOSKELETAL:  Ambulates independently SKIN: Warm and dry, no edema NEUROLOGIC:  Alert and oriented x 3. No focal neuro deficits noted. PSYCHIATRIC:  Normal affect   ASSESSMENT AND PLAN: .    Hypertension:  -continue valsartan 320 mg daily -continue metoprolol 25 mg BID. Alternatively, if BP control needed, could change to carvedilol   Atrial fibrillation, paroxysmal -CHA2DS2/VAS Stroke Risk Points=6  -on apixaban for hypercoagulable state -dropped to single antiplatelet with start of anticoagulation, see below   CAD with prior PCI for NSTEMI and then STEMI, aortic atherosclerosis: -changed from ticagrelor to clopidogrel with start of anticoagulation -with history of stent thrombosis, would make sure she is always on antiplatelet. Previously for colonoscopy held apixaban and clopidogrel but placed her on aspirin 81 mg while she off clopidogrel. Ok to repeat this regimen for future colonoscopies/procedures -continue atorvastatin 80 mg -counseled on red flag warning signs that need immediate medical attention   HLD: goal LDL <55  given diabetes -continue both atorvastatin and ezetimibe -LDL last 70 per [facility] 02/2023. We discussed goals, options. After shared decision making she wishes to continue current regimen and will work on lifestyle   Type II diabetes, with CAD history -tolerating Jardiance, metformin   Cardiac risk counseling and prevention recommendations: -recommend heart healthy/Mediterranean diet, with whole grains, fruits, vegetable, fish, lean  meats, nuts, and olive oil. Limit salt. -recommend moderate walking, 3-5 times/week for 30-50 minutes each session. Aim for at least 150 minutes.week. Goal should be pace of 3 miles/hours, or walking 1.5 miles in 30 minutes -recommend avoidance of tobacco products. Avoid excess alcohol.  Dispo: Follow-up in 6 months, or sooner as needed.  Signed, Jodelle Red, MD

## 2023-06-03 NOTE — Patient Instructions (Signed)
 Medication Instructions:  Your physician recommends that you continue on your current medications as directed. Please refer to the Current Medication list given to you today.  *If you need a refill on your cardiac medications before your next appointment, please call your pharmacy*  Follow-Up: At [facility], you and your health needs are our priority.  As part of our continuing mission to provide you with exceptional heart care, we have created designated Provider Care Teams.  These Care Teams include your primary Cardiologist (physician) and Advanced Practice Providers (APPs -  Physician Assistants and Nurse Practitioners) who all work together to provide you with the care you need, when you need it.  We recommend signing up for the patient portal called "MyChart".  Sign up information is provided on this After Visit Summary.  MyChart is used to connect with patients for Virtual Visits (Telemedicine).  Patients are able to view lab/test results, encounter notes, upcoming appointments, etc.  Non-urgent messages can be sent to your provider as well.   To learn more about what you can do with MyChart, go to [email or web address].    Your next appointment:   6 month(s)  Provider:   Jodelle Red, MD

## 2023-06-05 DIAGNOSIS — E1142 Type 2 diabetes mellitus with diabetic polyneuropathy: Secondary | ICD-10-CM | POA: Diagnosis not present

## 2023-06-05 DIAGNOSIS — J432 Centrilobular emphysema: Secondary | ICD-10-CM | POA: Diagnosis not present

## 2023-06-05 DIAGNOSIS — I480 Paroxysmal atrial fibrillation: Secondary | ICD-10-CM | POA: Diagnosis not present

## 2023-06-05 DIAGNOSIS — Z9181 History of falling: Secondary | ICD-10-CM | POA: Diagnosis not present

## 2023-06-15 DIAGNOSIS — I10 Essential (primary) hypertension: Secondary | ICD-10-CM | POA: Diagnosis not present

## 2023-06-15 DIAGNOSIS — E1142 Type 2 diabetes mellitus with diabetic polyneuropathy: Secondary | ICD-10-CM | POA: Diagnosis not present

## 2023-06-15 DIAGNOSIS — E611 Iron deficiency: Secondary | ICD-10-CM | POA: Diagnosis not present

## 2023-06-15 DIAGNOSIS — I25118 Atherosclerotic heart disease of native coronary artery with other forms of angina pectoris: Secondary | ICD-10-CM | POA: Diagnosis not present

## 2023-06-15 DIAGNOSIS — Z79899 Other long term (current) drug therapy: Secondary | ICD-10-CM | POA: Diagnosis not present

## 2023-06-16 ENCOUNTER — Telehealth: Payer: Self-pay

## 2023-06-16 DIAGNOSIS — I4891 Unspecified atrial fibrillation: Secondary | ICD-10-CM | POA: Diagnosis not present

## 2023-06-16 DIAGNOSIS — Z7901 Long term (current) use of anticoagulants: Secondary | ICD-10-CM | POA: Diagnosis not present

## 2023-06-16 DIAGNOSIS — Z860101 Personal history of adenomatous and serrated colon polyps: Secondary | ICD-10-CM | POA: Diagnosis not present

## 2023-06-16 DIAGNOSIS — I2510 Atherosclerotic heart disease of native coronary artery without angina pectoris: Secondary | ICD-10-CM | POA: Diagnosis not present

## 2023-06-16 DIAGNOSIS — Z7902 Long term (current) use of antithrombotics/antiplatelets: Secondary | ICD-10-CM | POA: Diagnosis not present

## 2023-06-16 NOTE — Telephone Encounter (Signed)
   Pre-operative Risk Assessment    Patient Name: Kathryn Bruce  DOB: 24-Nov-1945 MRN: [id number]   Date of last office visit: 06/03/23 Jodelle Red, MD Date of next office visit: 12/03/23 Jodelle Red, MD   Request for Surgical Clearance    Procedure:   COLONOSCOPY  Date of Surgery:  Clearance 08/04/23                                Surgeon:  DR Kerin Salen Surgeon's Group or Practice Name:  [facility] Phone number:  [phone] Fax number:  [phone]   Type of Clearance Requested:   - Medical  - Pharmacy:  Hold Clopidogrel (Plavix) 5 DAYS PRIOR AND ELIQUS 24 HOURS PRIOR AND 24 HOURS AFTER PROCEDURE   Type of Anesthesia:   PROPOFOL   Additional requests/questions:    Signed, Marlow Baars   06/16/2023, 5:16 PM

## 2023-06-17 NOTE — Telephone Encounter (Signed)
 Patient with diagnosis of atrial fibrillation on Eliquis for anticoagulation.    Procedure:   COLONOSCOPY   Date of Surgery:  Clearance 08/04/23    CHA2DS2-VASc Score = 6   This indicates a 9.7% annual risk of stroke. The patient's score is based upon: CHF History: 0 HTN History: 1 Diabetes History: 1 Stroke History: 0 Vascular Disease History: 1 Age Score: 2 Gender Score: 1    CrCl 108 Platelet count 418  Per office protocol, patient can hold Eliquis for 1 days prior to procedure, and 1 day after  Patient will not need bridging with Lovenox (enoxaparin) around procedure.  **This guidance is not considered finalized until pre-operative APP has relayed final recommendations.**

## 2023-06-17 NOTE — Telephone Encounter (Addendum)
     Primary Cardiologist: Jodelle Red, MD  Chart reviewed as part of pre-operative protocol coverage. Given past medical history and time since last visit, based on ACC/AHA guidelines, Kathryn Bruce would be at acceptable risk for the planned procedure without further cardiovascular testing.   CHA2DS2-VASc Score = 6   This indicates a 9.7% annual risk of stroke. The patient's score is based upon: CHF History: 0 HTN History: 1 Diabetes History: 1 Stroke History: 0 Vascular Disease History: 1 Age Score: 2 Gender Score: 1     CrCl 108 Platelet count 418   Per office protocol, patient can hold Eliquis for 1 days prior to procedure, and 1 day after  Patient will not need bridging with Lovenox (enoxaparin) around procedure.  Her Plavix may be held for 5 days prior to her procedure.  She will need to take 81 mg aspirin daily during her time off Plavix and throughout the perioperative period.  Please resume Plavix as soon as hemostasis is achieved.  Once Plavix and Eliquis are resumed her aspirin may be discontinued.  I will route this recommendation to the requesting party via Epic fax function and remove from pre-op pool.  Please call with questions.  Thomasene Ripple. Buna Cuppett NP-C     06/17/2023, 8:48 AM [facility] [address] Office [phone] Fax [phone]

## 2023-07-22 ENCOUNTER — Other Ambulatory Visit: Payer: Self-pay | Admitting: Cardiology

## 2023-07-22 DIAGNOSIS — I480 Paroxysmal atrial fibrillation: Secondary | ICD-10-CM

## 2023-07-22 NOTE — Telephone Encounter (Signed)
 Prescription refill request for Eliquis  received. Indication: Afib  Last office visit: 06/03/23 Kathryn Bruce)  Scr: 0.61 (12/03/22)  Age: 78 Weight: 89.9kg  Appropriate dose. Refill sent.

## 2023-08-04 DIAGNOSIS — E538 Deficiency of other specified B group vitamins: Secondary | ICD-10-CM | POA: Diagnosis not present

## 2023-08-12 DIAGNOSIS — E1142 Type 2 diabetes mellitus with diabetic polyneuropathy: Secondary | ICD-10-CM | POA: Diagnosis not present

## 2023-08-12 DIAGNOSIS — I25118 Atherosclerotic heart disease of native coronary artery with other forms of angina pectoris: Secondary | ICD-10-CM | POA: Diagnosis not present

## 2023-08-12 DIAGNOSIS — I10 Essential (primary) hypertension: Secondary | ICD-10-CM | POA: Diagnosis not present

## 2023-08-19 ENCOUNTER — Ambulatory Visit: Admitting: Podiatry

## 2023-08-25 DIAGNOSIS — R5383 Other fatigue: Secondary | ICD-10-CM | POA: Diagnosis not present

## 2023-08-25 DIAGNOSIS — I10 Essential (primary) hypertension: Secondary | ICD-10-CM | POA: Diagnosis not present

## 2023-09-28 DIAGNOSIS — I10 Essential (primary) hypertension: Secondary | ICD-10-CM | POA: Diagnosis not present

## 2023-09-28 DIAGNOSIS — J432 Centrilobular emphysema: Secondary | ICD-10-CM | POA: Diagnosis not present

## 2023-09-28 DIAGNOSIS — Z9181 History of falling: Secondary | ICD-10-CM | POA: Diagnosis not present

## 2023-09-28 DIAGNOSIS — E1142 Type 2 diabetes mellitus with diabetic polyneuropathy: Secondary | ICD-10-CM | POA: Diagnosis not present

## 2023-10-15 DIAGNOSIS — E1142 Type 2 diabetes mellitus with diabetic polyneuropathy: Secondary | ICD-10-CM | POA: Diagnosis not present

## 2023-10-15 DIAGNOSIS — I25118 Atherosclerotic heart disease of native coronary artery with other forms of angina pectoris: Secondary | ICD-10-CM | POA: Diagnosis not present

## 2023-10-15 DIAGNOSIS — Z79899 Other long term (current) drug therapy: Secondary | ICD-10-CM | POA: Diagnosis not present

## 2023-10-15 DIAGNOSIS — E611 Iron deficiency: Secondary | ICD-10-CM | POA: Diagnosis not present

## 2023-10-26 ENCOUNTER — Ambulatory Visit: Payer: Self-pay

## 2023-10-26 NOTE — Telephone Encounter (Signed)
 FYI Only or Action Required?: FYI only for provider.  Patient is followed in Pulmonology for DOE, last seen on 07/28/2022 by Gladis Leonor HERO, MD.  Called Nurse Triage reporting Cough.  Symptoms began several weeks ago.  Interventions attempted: Nothing.  Symptoms are: unchanged.  Triage Disposition: See Physician Within 24 Hours  Patient/caregiver understands and will follow disposition?: Yes      Copied from CRM #[id number]. Topic: Clinical - Red Word Triage >> Oct 26, 2023  2:22 PM Rilla B wrote: [facility] that prompted transfer to Nurse Triage: Persistant cough w phlem, history of COPD, some diff breathing   ----------------------------------------------------------------------- From previous Reason for Contact - Scheduling: Patient/patient representative is calling to schedule an appointment. Refer to attachments for appointment information. Reason for Disposition  [1] Known COPD or other severe lung disease (i.e., bronchiectasis, cystic fibrosis, lung surgery) AND [2] symptoms getting worse (i.e., increased sputum purulence or amount, increased breathing difficulty  Answer Assessment - Initial Assessment Questions E2C2 Pulmonary Triage - Initial Assessment Questions Chief Complaint (e.g., cough, sob, wheezing, fever, chills, sweat or additional symptoms) *Go to specific symptom protocol after initial questions. Productive cough, fatigue  How long have symptoms been present? 2 weeks  Have you tested for COVID or Flu? Note: If not, ask patient if a home test can be taken. If so, instruct patient to call back for positive results. No, does not have  MEDICINES:   Have you used any OTC meds to help with symptoms? No If yes, ask What medications? N/a  Have you used your inhalers/maintenance medication? No If yes, What medications? Albuterol  PRN - has not used in a few years  If inhaler, ask How many puffs and how often? Note: Review instructions on  medication in the chart. N/a  OXYGEN: Do you wear supplemental oxygen? No If yes, How many liters are you supposed to use? N/a  Do you monitor your oxygen levels? No, does not check regularly If yes, What is your reading (oxygen level) today? 94% BPM 60s  What is your usual oxygen saturation reading?  (Note: Pulmonary O2 sats should be 90% or greater) N/a     1. ONSET: When did the cough begin?      See above 2. SEVERITY: How bad is the cough today?      Persistent  3. SPUTUM: Describe the color of your sputum (e.g., none, dry cough; clear, white, yellow, green)     Unknown, thinks clear 4. HEMOPTYSIS: Are you coughing up any blood? If Yes, ask: How much? (e.g., flecks, streaks, tablespoons, etc.)     denies 5. DIFFICULTY BREATHING: Are you having difficulty breathing? If Yes, ask: How bad is it? (e.g., mild, moderate, severe)      Mild - above baseline [facility] does not appreciate audible SOB/wheezing during call. Pt is speaking in full sentences.  6. FEVER: Do you have a fever? If Yes, ask: What is your temperature, how was it measured, and when did it start?     denies 7. CARDIAC HISTORY: Do you have any history of heart disease? (e.g., heart attack, congestive heart failure)      Endorses having a stent and heart attacks 8. LUNG HISTORY: Do you have any history of lung disease?  (e.g., pulmonary embolus, asthma, emphysema)     COPD 9. PE RISK FACTORS: Do you have a history of blood clots? (or: recent major surgery, recent prolonged travel, bedridden)     denies 10. OTHER SYMPTOMS: Do you have any other  symptoms? (e.g., runny nose, wheezing, chest pain)       Runny nose at baseline 11. PREGNANCY: Is there any chance you are pregnant? When was your last menstrual period?       N/a 12. TRAVEL: Have you traveled out of the country in the last month? (e.g., travel history, exposures)       denies    Scheduled patient per request for  AV appt on October 28, 2023 with [place], [place] d/t limited transportation.  Protocols used: Cough - Acute Productive-A-AH

## 2023-10-26 NOTE — Telephone Encounter (Signed)
 FYI pt has an appt on 10/28/2023

## 2023-10-27 ENCOUNTER — Other Ambulatory Visit: Payer: Self-pay

## 2023-10-27 ENCOUNTER — Encounter: Payer: Self-pay | Admitting: Neurology

## 2023-10-27 DIAGNOSIS — R202 Paresthesia of skin: Secondary | ICD-10-CM

## 2023-10-28 ENCOUNTER — Ambulatory Visit (INDEPENDENT_AMBULATORY_CARE_PROVIDER_SITE_OTHER)

## 2023-10-28 ENCOUNTER — Encounter (HOSPITAL_BASED_OUTPATIENT_CLINIC_OR_DEPARTMENT_OTHER): Payer: Self-pay

## 2023-10-28 VITALS — BP 148/74 | HR 57 | Ht 63.0 in | Wt 196.0 lb

## 2023-10-28 DIAGNOSIS — J208 Acute bronchitis due to other specified organisms: Secondary | ICD-10-CM | POA: Diagnosis not present

## 2023-10-28 DIAGNOSIS — R911 Solitary pulmonary nodule: Secondary | ICD-10-CM | POA: Diagnosis not present

## 2023-10-28 DIAGNOSIS — J439 Emphysema, unspecified: Secondary | ICD-10-CM | POA: Diagnosis not present

## 2023-10-28 DIAGNOSIS — G4719 Other hypersomnia: Secondary | ICD-10-CM

## 2023-10-28 MED ORDER — ALBUTEROL SULFATE HFA 108 (90 BASE) MCG/ACT IN AERS: 2.0000 | INHALATION_SPRAY | Freq: Four times a day (QID) | RESPIRATORY_TRACT | 6 refills | Status: AC | PRN

## 2023-10-28 MED ORDER — GUAIFENESIN ER 600 MG PO TB12: 600.0000 mg | ORAL_TABLET | Freq: Two times a day (BID) | ORAL | 1 refills | Status: DC | PRN

## 2023-10-28 NOTE — Progress Notes (Signed)
 @Patient  ID: Kathryn Bruce, female    DOB: 1945/06/11, 78 y.o.   MRN: [id number]  Chief Complaint  Patient presents with   Acute Visit    COUGH     Referring provider: Regino Slater, MD  HPI: Kathryn Bruce is a 78 y/o female former smoker with PMH of CAD s/p PCI, emphysema, DM2, obesity, and Afib who presents today for evaluation of cough.  She was last seen in our clinic in May 2024.  Pulmonary function test completed in November 2023 were reviewed and were noted to be normal.  She did have a low-dose chest CT for lung cancer screening in December 2023 and is inquiring about this today.  Results of this test were reviewed with her which did demonstrate several small pulmonary nodules.  She reports that she did not follow-up after that and did not fill the prescription for albuterol  inhaler either.  She states that she does not have limitations in completing her ADLs due to dyspnea.  She has had a cough for the last 3 weeks.  She reports production of clear to white sputum at times.  She has also had some fatigue associated with this.  She denies any fever, chills, night sweats, weight loss, hemoptysis or chest pain.  She denies any sick contacts.  She denies issues with seasonal allergies and denies any sinus congestion.  She does also complain of chronic daytime sleepiness requiring her to take naps throughout the day.  She also reports that she snores at night.  She reports never having a sleep evaluation.  She states that the symptoms are not new.  TEST/EVENTS :   02/20/2022 CT chest lung cancer screening low-dose:  IMPRESSION: 1. Lung-RADS 2S, benign appearance or behavior. Continue annual screening with low-dose chest CT without contrast in 12 months. 2. The S modifier above refers to potentially clinically significant non lung cancer related findings. Specifically, there is aortic atherosclerosis, in addition to left main and 2 vessel coronary artery disease. Please note that  although the presence of coronary artery calcium  documents the presence of coronary artery disease, the severity of this disease and any potential stenosis cannot be assessed on this non-gated CT examination. Assessment for potential risk factor modification, dietary therapy or pharmacologic therapy may be warranted, if clinically indicated. 3. Mild diffuse bronchial wall thickening with very mild centrilobular and paraseptal emphysema; imaging findings suggestive of underlying COPD. 4. There are calcifications of the aortic valve and mitral annulus. Echocardiographic correlation for evaluation of potential valvular dysfunction may be warranted if clinically indicated.  No Known Allergies   There is no immunization history on file for this patient.  Past Medical History:  Diagnosis Date   Acute ST elevation myocardial infarction (STEMI) of anterior wall (HCC) 04/28/2020   Aortic atherosclerosis (HCC) 10/05/2017   Arthritis    spine, knees (10/06/2017)   CAD (coronary artery disease), native coronary artery 10/05/2017   10/06/2016 cardiac catheterization showing 99% LAD stenosis, 20 to 30% RCA and circumflex stenosis, 3.5 x 15 mm Sierra stent in proximal LAD by Dr. Verlin   Cancer [facility])    SKIN CANCDER REMOVED FROM NOSE    Chronic lower back pain    Diabetes mellitus type 2, noninsulin dependent (HCC) 10/05/2017   GERD (gastroesophageal reflux disease)    Hidradenitis    Hypercholesteremia    Hyperlipidemia 10/05/2017   Hypertension    Obesity (BMI 30-39.9) 10/05/2017   Osteoarthritis (arthritis due to wear and tear of joints)  right knee   Sleep apnea    STOP BANG SCORE 5   Tobacco use 09/05/2011   Type II diabetes mellitus (HCC)     Tobacco History: Social History   Tobacco Use  Smoking Status Former   Current packs/day: 0.00   Average packs/day: 0.5 packs/day for 42.0 years (21.0 ttl pk-yrs)   Types: Cigarettes   Start date: 09/07/1977   Quit date: 09/08/2019   Years  since quitting: 4.1  Smokeless Tobacco Never   Counseling given: Not Answered   Outpatient Medications Prior to Visit  Medication Sig Dispense Refill   atorvastatin  (LIPITOR ) 80 MG tablet TAKE 1 TABLET(80 MG) BY MOUTH DAILY 90 tablet 2   blood glucose meter kit and supplies KIT Dispense based on patient and insurance preference. Use up to four times daily as directed. 1 each 0   clopidogrel  (PLAVIX ) 75 MG tablet TAKE 1 TABLET(75 MG) BY MOUTH DAILY 90 tablet 3   ELIQUIS  5 MG TABS tablet TAKE 1 TABLET(5 MG) BY MOUTH TWICE DAILY 60 tablet 5   ezetimibe  (ZETIA ) 10 MG tablet TAKE 1 TABLET(10 MG) BY MOUTH DAILY 90 tablet 1   JARDIANCE  25 MG TABS tablet Take 25 mg by mouth daily.     metFORMIN (GLUCOPHAGE) 500 MG tablet Take 1,000 mg by mouth 2 (two) times daily with a meal.     metoprolol  tartrate (LOPRESSOR ) 25 MG tablet Take 1 tablet (25 mg total) by mouth 2 (two) times daily. 180 tablet 3   nitroGLYCERIN  (NITROSTAT ) 0.4 MG SL tablet Place 1 tablet (0.4 mg total) under the tongue every 5 (five) minutes x 3 doses as needed for chest pain. 25 tablet 12   omeprazole (PRILOSEC) 40 MG capsule Take 40 mg by mouth daily.  1   pantoprazole  (PROTONIX ) 40 MG tablet Take 40 mg by mouth every morning.     valsartan  (DIOVAN ) 320 MG tablet Take 1 tablet (320 mg total) by mouth daily. 90 tablet 3   DULoxetine  (CYMBALTA ) 60 MG capsule Take 1 capsule by mouth daily.     albuterol  (VENTOLIN  HFA) 108 (90 Base) MCG/ACT inhaler Inhale 2 puffs into the lungs every 6 (six) hours as needed for wheezing or shortness of breath. (Patient not taking: Reported on 10/28/2023) 8 g 6   pregabalin (LYRICA) 25 MG capsule Take by mouth. (Patient not taking: Reported on 10/28/2023)     No facility-administered medications prior to visit.     Review of Systems:   Constitutional:   No  weight loss, night sweats,  Fevers, chills, , or  lassitude.  Positive for fatigue and excessive daytime sleepiness  HEENT:   No headaches,   Difficulty swallowing,  Tooth/dental problems, or  Sore throat,                No sneezing, itching, ear ache, nasal congestion, post nasal drip,   CV:  No chest pain,  Orthopnea, PND, swelling in lower extremities, anasarca, dizziness, palpitations, syncope.   GI  No heartburn, indigestion, abdominal pain, nausea, vomiting, diarrhea, change in bowel habits, loss of appetite, bloody stools.   Resp: No shortness of breath with exertion or at rest.  No excess mucus,  No non-productive cough,  No coughing up of blood.  No change in color of mucus.  No wheezing.  No chest wall deformity positive for productive cough of clear to white sputum x 3 weeks  Skin: no rash or lesions.  GU: no dysuria, change in color of urine, no urgency  or frequency.  No flank pain, no hematuria   MS:  No joint pain or swelling.  No decreased range of motion.  No back pain.    Physical Exam  BP (!) 148/74 (BP Location: Left Arm, Patient Position: Sitting)   Pulse (!) 57   Ht 5' 3 (1.6 m)   Wt 196 lb (88.9 kg)   SpO2 99%   BMI 34.72 kg/m   GEN: A/Ox3; pleasant , NAD, well nourished    HEENT:  [place]/AT,  EACs-clear, TMs-wnl, NOSE-clear, THROAT-clear, no lesions, no postnasal drip or exudate noted.   NECK:  Supple w/ fair ROM; no JVD; normal carotid impulses w/o bruits; no thyromegaly or nodules palpated; no lymphadenopathy.    RESP  Clear  P & A; w/o, wheezes/ rales/ or rhonchi. no accessory muscle use, no dullness to percussion  CARD:  RRR, no m/r/g, no peripheral edema, pulses intact, no cyanosis or clubbing.  GI:   Soft & nt; nml bowel sounds; no organomegaly or masses detected.   Musco: Warm bil, no deformities or joint swelling noted.   Neuro: alert, no focal deficits noted.    Skin: Warm, no lesions or rashes    Lab Results:  CBC    Component Value Date/Time   WBC 9.2 12/03/2022 1525   WBC 12.8 (H) 11/04/2021 0247   RBC 5.29 (H) 12/03/2022 1525   RBC 5.31 (H) 11/04/2021 0247   HGB 13.2  12/03/2022 1525   HCT 43.2 12/03/2022 1525   PLT 418 12/03/2022 1525   MCV 82 12/03/2022 1525   MCH 25.0 (L) 12/03/2022 1525   MCH 23.9 (L) 11/04/2021 0247   MCHC 30.6 (L) 12/03/2022 1525   MCHC 30.6 11/04/2021 0247   RDW 20.5 (H) 12/03/2022 1525   LYMPHSABS 1.8 04/28/2020 0701   MONOABS 1.3 (H) 04/28/2020 0701   EOSABS 0.1 04/28/2020 0701   BASOSABS 0.0 04/28/2020 0701    BMET    Component Value Date/Time   NA 145 (H) 12/03/2022 1525   K 4.2 12/03/2022 1525   CL 104 12/03/2022 1525   CO2 24 12/03/2022 1525   GLUCOSE 77 12/03/2022 1525   GLUCOSE 205 (H) 11/04/2021 0247   BUN 12 12/03/2022 1525   CREATININE 0.61 12/03/2022 1525   CALCIUM  9.4 12/03/2022 1525   GFRNONAA >60 11/04/2021 0247   GFRAA >60 03/16/2019 0422    BNP    Component Value Date/Time   BNP 53.9 03/12/2019 0849    ProBNP No results found for: PROBNP  Imaging: No results found.  Administration History     None          Latest Ref Rng & Units 01/15/2022   12:58 PM  PFT Results  FVC-Pre L 3.09   FVC-Predicted Pre % 116   FVC-Post L 3.05   FVC-Predicted Post % 115   Pre FEV1/FVC % % 86   Post FEV1/FCV % % 88   FEV1-Pre L 2.65   FEV1-Predicted Pre % 133   FEV1-Post L 2.69     No results found for: NITRICOXIDE   Assessment & Plan:   Assessment & Plan Pulmonary emphysema, unspecified emphysema type (HCC) -  Normal PFTs in 2023; consider repeating once her episode of bronchitis is resolved -  May use albuterol  as needed however she does not complain of dyspnea as a primary concern. Lung nodule -  Plan for follow-up low-dose chest CT; ordered today. Viral bronchitis - Supportive treatment with increase in fluids, ambulation, Mucinex  twice a day. -  May also use albuterol  every 4-6 hours as needed if she develops dyspnea. - Return to clinic sooner if change in phlegm, fever, chills, new respiratory complaints Excessive daytime sleepiness - Recommend home sleep test; patient  refuses presently - Patient agreeable to readdressing this at follow-up appointment.   Return in about 6 weeks (around 12/09/2023) for CT results.  Candis Dandy, PA-C 10/28/2023

## 2023-10-28 NOTE — Patient Instructions (Signed)
 Supportive treatment for acute viral bronchitis:  increase fluid intake, exercise as tolerated, Tylenol  as needed for aches and pains per bottle instructions.  Mucinex  tablets :   Take one twice daily as needed for cough/secretions   Albuterol  inhaler:  2 puffs inhaled every 4-6 hours as needed for shortness of breath.  Low dose CT Chest ordered for lung cancer screening; complete and return to clinic for results  Return to clinic sooner if fever, chills, change in sputum, night sweats, new or worsening respiratory complaints.

## 2023-10-30 DIAGNOSIS — Z1231 Encounter for screening mammogram for malignant neoplasm of breast: Secondary | ICD-10-CM | POA: Diagnosis not present

## 2023-11-04 ENCOUNTER — Other Ambulatory Visit: Payer: Self-pay | Admitting: Cardiology

## 2023-11-04 ENCOUNTER — Other Ambulatory Visit (HOSPITAL_BASED_OUTPATIENT_CLINIC_OR_DEPARTMENT_OTHER): Payer: Self-pay | Admitting: Family

## 2023-11-18 DIAGNOSIS — E876 Hypokalemia: Secondary | ICD-10-CM | POA: Diagnosis not present

## 2023-11-19 ENCOUNTER — Telehealth: Payer: Self-pay | Admitting: Acute Care

## 2023-11-19 ENCOUNTER — Telehealth (HOSPITAL_BASED_OUTPATIENT_CLINIC_OR_DEPARTMENT_OTHER): Payer: Self-pay

## 2023-11-19 NOTE — Telephone Encounter (Signed)
 Received a VM from E2C2 regarding pt wanting to schedule annual LDCT. However, patient is 78 yo and CMS will not cover LCS scans past age 69yo. Informed pt of this information, she verbalized understanding. Advised pt she could pay out of pocket for the scan but the order would need to come from her PCP. Advised pt she could speak with her PCP about lung surveillance. Pt verbalized understanding and denied any further questions or concerns at this time.

## 2023-11-19 NOTE — Telephone Encounter (Signed)
 Copied from CRM #[id number]. Topic: General - Other >> Nov 19, 2023 11:30 AM Russell PARAS wrote: Reason for CRM:   Pt was scheduled to come in on 10/1 w/[facility] to review results of CT scan she ordered. She contacted her insurance, who advise they do not cover the LCS after the age of 68. She is wondering if she will still need to attend the appt w/[facility] even though she will not have the screening performed.   CB#  [phone] [id number]

## 2023-11-26 ENCOUNTER — Encounter: Admitting: Neurology

## 2023-11-30 ENCOUNTER — Other Ambulatory Visit: Payer: Self-pay

## 2023-11-30 DIAGNOSIS — R911 Solitary pulmonary nodule: Secondary | ICD-10-CM

## 2023-11-30 NOTE — Progress Notes (Unsigned)
 @Patient  ID: Kathryn Bruce, female    DOB: 10/03/1945, 78 y.o.   MRN: [id number]  No chief complaint on file.   Referring provider: No ref. provider found  HPI:   TEST/EVENTS :   No Known Allergies   There is no immunization history on file for this patient.  Past Medical History:  Diagnosis Date   Acute ST elevation myocardial infarction (STEMI) of anterior wall (HCC) 04/28/2020   Aortic atherosclerosis (HCC) 10/05/2017   Arthritis    spine, knees (10/06/2017)   CAD (coronary artery disease), native coronary artery 10/05/2017   10/06/2016 cardiac catheterization showing 99% LAD stenosis, 20 to 30% RCA and circumflex stenosis, 3.5 x 15 mm Sierra stent in proximal LAD by Dr. Verlin   Cancer [facility]    SKIN CANCDER REMOVED FROM NOSE    Chronic lower back pain    Diabetes mellitus type 2, noninsulin dependent (HCC) 10/05/2017   GERD (gastroesophageal reflux disease)    Hidradenitis    Hypercholesteremia    Hyperlipidemia 10/05/2017   Hypertension    Obesity (BMI 30-39.9) 10/05/2017   Osteoarthritis (arthritis due to wear and tear of joints)    right knee   Sleep apnea    STOP BANG SCORE 5   Tobacco use 09/05/2011   Type II diabetes mellitus (HCC)     Tobacco History: Social History   Tobacco Use  Smoking Status Former   Current packs/day: 0.00   Average packs/day: 0.5 packs/day for 42.0 years (21.0 ttl pk-yrs)   Types: Cigarettes   Start date: 09/07/1977   Quit date: 09/08/2019   Years since quitting: 4.2  Smokeless Tobacco Never   Counseling given: Not Answered   Outpatient Medications Prior to Visit  Medication Sig Dispense Refill   albuterol  (VENTOLIN  HFA) 108 (90 Base) MCG/ACT inhaler Inhale 2 puffs into the lungs every 6 (six) hours as needed for wheezing or shortness of breath. 8 g 6   atorvastatin  (LIPITOR ) 80 MG tablet TAKE 1 TABLET(80 MG) BY MOUTH DAILY 90 tablet 2   blood glucose meter kit and supplies KIT Dispense based on patient and insurance  preference. Use up to four times daily as directed. 1 each 0   clopidogrel  (PLAVIX ) 75 MG tablet TAKE 1 TABLET(75 MG) BY MOUTH DAILY 90 tablet 1   ELIQUIS  5 MG TABS tablet TAKE 1 TABLET(5 MG) BY MOUTH TWICE DAILY 60 tablet 5   ezetimibe  (ZETIA ) 10 MG tablet TAKE 1 TABLET(10 MG) BY MOUTH DAILY 90 tablet 1   guaiFENesin  (MUCINEX ) 600 MG 12 hr tablet Take 1 tablet (600 mg total) by mouth 2 (two) times daily as needed. 30 tablet 1   JARDIANCE  25 MG TABS tablet Take 25 mg by mouth daily.     metFORMIN (GLUCOPHAGE) 500 MG tablet Take 1,000 mg by mouth 2 (two) times daily with a meal.     metoprolol  tartrate (LOPRESSOR ) 25 MG tablet Take 1 tablet (25 mg total) by mouth 2 (two) times daily. 180 tablet 3   nitroGLYCERIN  (NITROSTAT ) 0.4 MG SL tablet Place 1 tablet (0.4 mg total) under the tongue every 5 (five) minutes x 3 doses as needed for chest pain. 25 tablet 12   omeprazole (PRILOSEC) 40 MG capsule Take 40 mg by mouth daily.  1   pantoprazole  (PROTONIX ) 40 MG tablet Take 40 mg by mouth every morning.     valsartan  (DIOVAN ) 320 MG tablet Take 1 tablet (320 mg total) by mouth daily. 90 tablet 3   No facility-administered  medications prior to visit.     Review of Systems:   Constitutional:   No  weight loss, night sweats,  Fevers, chills, fatigue, or  lassitude.  HEENT:   No headaches,  Difficulty swallowing,  Tooth/dental problems, or  Sore throat,                No sneezing, itching, ear ache, nasal congestion, post nasal drip,   CV:  No chest pain,  Orthopnea, PND, swelling in lower extremities, anasarca, dizziness, palpitations, syncope.   GI  No heartburn, indigestion, abdominal pain, nausea, vomiting, diarrhea, change in bowel habits, loss of appetite, bloody stools.   Resp: No shortness of breath with exertion or at rest.  No excess mucus, no productive cough,  No non-productive cough,  No coughing up of blood.  No change in color of mucus.  No wheezing.  No chest wall deformity  Skin:  no rash or lesions.  GU: no dysuria, change in color of urine, no urgency or frequency.  No flank pain, no hematuria   MS:  No joint pain or swelling.  No decreased range of motion.  No back pain.    Physical Exam  There were no vitals taken for this visit.  GEN: A/Ox3; pleasant , NAD, well nourished    HEENT:  [place]/AT,  EACs-clear, TMs-wnl, NOSE-clear, THROAT-clear, no lesions, no postnasal drip or exudate noted.   NECK:  Supple w/ fair ROM; no JVD; normal carotid impulses w/o bruits; no thyromegaly or nodules palpated; no lymphadenopathy.    RESP  Clear  P & A; w/o, wheezes/ rales/ or rhonchi. no accessory muscle use, no dullness to percussion  CARD:  RRR, no m/r/g, no peripheral edema, pulses intact, no cyanosis or clubbing.  GI:   Soft & nt; nml bowel sounds; no organomegaly or masses detected.   Musco: Warm bil, no deformities or joint swelling noted.   Neuro: alert, no focal deficits noted.    Skin: Warm, no lesions or rashes    Lab Results:  CBC    Component Value Date/Time   WBC 9.2 12/03/2022 1525   WBC 12.8 (H) 11/04/2021 0247   RBC 5.29 (H) 12/03/2022 1525   RBC 5.31 (H) 11/04/2021 0247   HGB 13.2 12/03/2022 1525   HCT 43.2 12/03/2022 1525   PLT 418 12/03/2022 1525   MCV 82 12/03/2022 1525   MCH 25.0 (L) 12/03/2022 1525   MCH 23.9 (L) 11/04/2021 0247   MCHC 30.6 (L) 12/03/2022 1525   MCHC 30.6 11/04/2021 0247   RDW 20.5 (H) 12/03/2022 1525   LYMPHSABS 1.8 04/28/2020 0701   MONOABS 1.3 (H) 04/28/2020 0701   EOSABS 0.1 04/28/2020 0701   BASOSABS 0.0 04/28/2020 0701    BMET    Component Value Date/Time   NA 145 (H) 12/03/2022 1525   K 4.2 12/03/2022 1525   CL 104 12/03/2022 1525   CO2 24 12/03/2022 1525   GLUCOSE 77 12/03/2022 1525   GLUCOSE 205 (H) 11/04/2021 0247   BUN 12 12/03/2022 1525   CREATININE 0.61 12/03/2022 1525   CALCIUM  9.4 12/03/2022 1525   GFRNONAA >60 11/04/2021 0247   GFRAA >60 03/16/2019 0422    BNP    Component Value  Date/Time   BNP 53.9 03/12/2019 0849    ProBNP No results found for: PROBNP  Imaging: No results found.  Administration History     None          Latest Ref Rng & Units 01/15/2022   12:58  PM  PFT Results  FVC-Pre L 3.09   FVC-Predicted Pre % 116   FVC-Post L 3.05   FVC-Predicted Post % 115   Pre FEV1/FVC % % 86   Post FEV1/FCV % % 88   FEV1-Pre L 2.65   FEV1-Predicted Pre % 133   FEV1-Post L 2.69     No results found for: NITRICOXIDE   Assessment & Plan:   Assessment & Plan Lung nodule    No follow-ups on file.  Candis Dandy, PA-C 11/30/2023

## 2023-12-02 ENCOUNTER — Ambulatory Visit (HOSPITAL_BASED_OUTPATIENT_CLINIC_OR_DEPARTMENT_OTHER): Admitting: Cardiology

## 2023-12-02 ENCOUNTER — Encounter (HOSPITAL_BASED_OUTPATIENT_CLINIC_OR_DEPARTMENT_OTHER): Payer: Self-pay | Admitting: Cardiology

## 2023-12-02 VITALS — BP 134/68 | HR 64 | Resp 17 | Ht 63.0 in | Wt 200.0 lb

## 2023-12-02 DIAGNOSIS — I252 Old myocardial infarction: Secondary | ICD-10-CM

## 2023-12-02 DIAGNOSIS — I480 Paroxysmal atrial fibrillation: Secondary | ICD-10-CM

## 2023-12-02 DIAGNOSIS — I10 Essential (primary) hypertension: Secondary | ICD-10-CM

## 2023-12-02 DIAGNOSIS — D6869 Other thrombophilia: Secondary | ICD-10-CM

## 2023-12-02 DIAGNOSIS — Z7901 Long term (current) use of anticoagulants: Secondary | ICD-10-CM | POA: Diagnosis not present

## 2023-12-02 DIAGNOSIS — I2510 Atherosclerotic heart disease of native coronary artery without angina pectoris: Secondary | ICD-10-CM

## 2023-12-02 DIAGNOSIS — R5382 Chronic fatigue, unspecified: Secondary | ICD-10-CM

## 2023-12-02 NOTE — Patient Instructions (Signed)

## 2023-12-02 NOTE — Progress Notes (Signed)
 Cardiology Office Note:  .    Date:  12/02/2023  ID:  ELVERNA CAFFEE, DOB 27-Nov-1945, MRN [id number] PCP: Regino Slater, MD  [facility] Providers Cardiologist:  Shelda Bruckner, MD     History of Present Illness: .    Kathryn Bruce is a 78 y.o. female with a history of paroxysmal atrial fibrillation, CAD with NSTEMI in 09/2017 s/p DES to proximal LAD followed by STEMI 04/2020 due to in stent thrombosis, hypertension, hyperlipidemia, type 2 diabetes mellitus, obesity, and prior tobacco use. Presents here for follow up.  Today: Overall feels like she is doing ok. Has a lot of fatigue, doesn't have much energy to do anything. No pain other than in her back. No shortness of breath now, was having a lot of coughing before, this has improved, has been following with pulmonology, has pending CT scan.   Has scattered intermittent sharp, focal chest pain across her chest, usually right/lateral, very brief/focal.   ROS:  Denies shortness of breath at rest or with normal exertion. No PND, orthopnea, LE edema or unexpected weight gain. No syncope. ROS otherwise negative except as noted.   Studies Reviewed: SABRA           Physical Exam:    VS:  BP 134/68   Pulse 64   Resp 17   Ht 5' 3 (1.6 m)   Wt 200 lb (90.7 kg)   SpO2 91%   BMI 35.43 kg/m    Wt Readings from Last 3 Encounters:  12/02/23 200 lb (90.7 kg)  10/28/23 196 lb (88.9 kg)  06/03/23 198 lb 4.8 oz (89.9 kg)    GEN: Well nourished, well developed in no acute distress HEENT: Normal, moist mucous membranes NECK: No JVD CARDIAC: regular rhythm, normal S1 and S2, no rubs or gallops. 2/6 aortic and 1/6 left sternal border murmur. VASCULAR: Radial and DP pulses 2+ bilaterally. No carotid bruits RESPIRATORY:  Clear to auscultation without rales, wheezing or rhonchi, with prolonged expiratory phase ABDOMEN: Soft, non-tender, non-distended MUSCULOSKELETAL:  Ambulates independently SKIN: Warm and dry, no  edema NEUROLOGIC:  Alert and oriented x 3. No focal neuro deficits noted. PSYCHIATRIC:  Normal affect   ASSESSMENT AND PLAN: .    Chronic fatigue: reviewed today. No high risk features. She will continue to monitor  Hypertension:  -continue valsartan  320 mg daily -continue metoprolol  25 mg BID. Alternatively, if BP control needed, could change to carvedilol    Atrial fibrillation, paroxysmal -CHA2DS2/VAS Stroke Risk Points=6  -on apixaban  for hypercoagulable state -dropped to single antiplatelet with start of anticoagulation, see below   CAD with prior PCI for NSTEMI and then STEMI, aortic atherosclerosis: -changed from ticagrelor  to clopidogrel  with start of anticoagulation -with history of stent thrombosis, would make sure she is always on antiplatelet. Previously for colonoscopy held apixaban  and clopidogrel  but placed her on aspirin  81 mg while she off clopidogrel . Ok to repeat this regimen for future colonoscopies/procedures -continue atorvastatin  80 mg -counseled on red flag warning signs that need immediate medical attention   HLD: goal LDL <55 given diabetes -continue both atorvastatin  and ezetimibe  -LDL last 70 per [facility] 02/2023. We discussed goals, options. After shared decision making she wishes to continue current regimen and will work on lifestyle   Type II diabetes, with CAD history -tolerating Jardiance , metformin   Cardiac risk counseling and prevention recommendations: -recommend heart healthy/Mediterranean diet, with whole grains, fruits, vegetable, fish, lean meats, nuts, and olive oil. Limit salt. -recommend moderate walking, 3-5 times/week for  30-50 minutes each session. Aim for at least 150 minutes.week. Goal should be pace of 3 miles/hours, or walking 1.5 miles in 30 minutes -recommend avoidance of tobacco products. Avoid excess alcohol.  Dispo: Follow-up in 6 months, or sooner as needed.  Signed, Shelda Bruckner, MD

## 2023-12-03 ENCOUNTER — Ambulatory Visit (HOSPITAL_BASED_OUTPATIENT_CLINIC_OR_DEPARTMENT_OTHER)
Admission: RE | Admit: 2023-12-03 | Discharge: 2023-12-03 | Disposition: A | Discharge status: Home / Self Care | Source: Ambulatory Visit

## 2023-12-03 ENCOUNTER — Ambulatory Visit (HOSPITAL_BASED_OUTPATIENT_CLINIC_OR_DEPARTMENT_OTHER)

## 2023-12-03 DIAGNOSIS — R911 Solitary pulmonary nodule: Secondary | ICD-10-CM | POA: Insufficient documentation

## 2023-12-03 DIAGNOSIS — I700 Atherosclerosis of aorta: Secondary | ICD-10-CM | POA: Diagnosis not present

## 2023-12-03 DIAGNOSIS — I2510 Atherosclerotic heart disease of native coronary artery without angina pectoris: Secondary | ICD-10-CM | POA: Diagnosis not present

## 2023-12-08 NOTE — Progress Notes (Deleted)
 @Patient  ID: Kathryn Bruce, female    DOB: [birth year], 78 y.o.   MRN: [id number]  No chief complaint on file.   Referring provider: Regino Slater, MD  HPI: [name] is a 78 y/o female former smoker with PMH of CAD s/p PCI, emphysema, DM2, obesity, and Afib who presents today for follow up of recent Chest CT.  She was most recently seen in our office on 10/28/2023.  TEST/EVENTS :   No Known Allergies   There is no immunization history on file for this patient.  Past Medical History:  Diagnosis Date   Acute ST elevation myocardial infarction (STEMI) of anterior wall (HCC) 04/28/2020   Aortic atherosclerosis 10/05/2017   Arthritis    spine, knees (10/06/2017)   CAD (coronary artery disease), native coronary artery 10/05/2017   10/06/2016 cardiac catheterization showing 99% LAD stenosis, 20 to 30% RCA and circumflex stenosis, 3.5 x 15 mm Sierra stent in proximal LAD by Dr. Verlin   Cancer [facility])    SKIN CANCDER REMOVED FROM NOSE    Chronic lower back pain    Diabetes mellitus type 2, noninsulin dependent (HCC) 10/05/2017   GERD (gastroesophageal reflux disease)    Hidradenitis    Hypercholesteremia    Hyperlipidemia 10/05/2017   Hypertension    Obesity (BMI 30-39.9) 10/05/2017   Osteoarthritis (arthritis due to wear and tear of joints)    right knee   Sleep apnea    STOP BANG SCORE 5   Tobacco use 09/05/2011   Type II diabetes mellitus (HCC)     Tobacco History: Social History   Tobacco Use  Smoking Status Former   Current packs/day: 0.00   Average packs/day: 0.5 packs/day for 42.0 years (21.0 ttl pk-yrs)   Types: Cigarettes   Start date: 09/07/1977   Quit date: 09/08/2019   Years since quitting: 4.2  Smokeless Tobacco Never   Counseling given: Not Answered   Outpatient Medications Prior to Visit  Medication Sig Dispense Refill   albuterol  (VENTOLIN  HFA) 108 (90 Base) MCG/ACT inhaler Inhale 2 puffs into the lungs every 6 (six) hours as needed for wheezing or  shortness of breath. 8 g 6   atorvastatin  (LIPITOR ) 80 MG tablet TAKE 1 TABLET(80 MG) BY MOUTH DAILY 90 tablet 2   blood glucose meter kit and supplies KIT Dispense based on patient and insurance preference. Use up to four times daily as directed. 1 each 0   clopidogrel  (PLAVIX ) 75 MG tablet TAKE 1 TABLET(75 MG) BY MOUTH DAILY 90 tablet 1   ELIQUIS  5 MG TABS tablet TAKE 1 TABLET(5 MG) BY MOUTH TWICE DAILY 60 tablet 5   ezetimibe  (ZETIA ) 10 MG tablet TAKE 1 TABLET(10 MG) BY MOUTH DAILY 90 tablet 1   guaiFENesin  (MUCINEX ) 600 MG 12 hr tablet Take 1 tablet (600 mg total) by mouth 2 (two) times daily as needed. (Patient not taking: Reported on 12/02/2023) 30 tablet 1   JARDIANCE  25 MG TABS tablet Take 25 mg by mouth daily.     metFORMIN (GLUCOPHAGE) 500 MG tablet Take 1,000 mg by mouth 2 (two) times daily with a meal.     metoprolol  tartrate (LOPRESSOR ) 25 MG tablet Take 1 tablet (25 mg total) by mouth 2 (two) times daily. 180 tablet 3   nitroGLYCERIN  (NITROSTAT ) 0.4 MG SL tablet Place 1 tablet (0.4 mg total) under the tongue every 5 (five) minutes x 3 doses as needed for chest pain. 25 tablet 12   omeprazole (PRILOSEC) 40 MG capsule Take 40  mg by mouth daily. (Patient not taking: Reported on 12/02/2023)  1   pantoprazole  (PROTONIX ) 40 MG tablet Take 40 mg by mouth every morning.     valsartan  (DIOVAN ) 320 MG tablet Take 1 tablet (320 mg total) by mouth daily. 90 tablet 3   No facility-administered medications prior to visit.     Review of Systems:   Constitutional:   No  weight loss, night sweats,  Fevers, chills, fatigue, or  lassitude.  HEENT:   No headaches,  Difficulty swallowing,  Tooth/dental problems, or  Sore throat,                No sneezing, itching, ear ache, nasal congestion, post nasal drip,   CV:  No chest pain,  Orthopnea, PND, swelling in lower extremities, anasarca, dizziness, palpitations, syncope.   GI  No heartburn, indigestion, abdominal pain, nausea, vomiting, diarrhea,  change in bowel habits, loss of appetite, bloody stools.   Resp: No shortness of breath with exertion or at rest.  No excess mucus, no productive cough,  No non-productive cough,  No coughing up of blood.  No change in color of mucus.  No wheezing.  No chest wall deformity  Skin: no rash or lesions.  GU: no dysuria, change in color of urine, no urgency or frequency.  No flank pain, no hematuria   MS:  No joint pain or swelling.  No decreased range of motion.  No back pain.    Physical Exam  There were no vitals taken for this visit.  GEN: A/Ox3; pleasant , NAD, well nourished    HEENT:  [place]/AT,  EACs-clear, TMs-wnl, NOSE-clear, THROAT-clear, no lesions, no postnasal drip or exudate noted.   NECK:  Supple w/ fair ROM; no JVD; normal carotid impulses w/o bruits; no thyromegaly or nodules palpated; no lymphadenopathy.    RESP  Clear  P & A; w/o, wheezes/ rales/ or rhonchi. no accessory muscle use, no dullness to percussion  CARD:  RRR, no m/r/g, no peripheral edema, pulses intact, no cyanosis or clubbing.  GI:   Soft & nt; nml bowel sounds; no organomegaly or masses detected.   Musco: Warm bil, no deformities or joint swelling noted.   Neuro: alert, no focal deficits noted.    Skin: Warm, no lesions or rashes    Lab Results:  CBC    Component Value Date/Time   WBC 9.2 12/03/2022 1525   WBC 12.8 (H) 11/04/2021 0247   RBC 5.29 (H) 12/03/2022 1525   RBC 5.31 (H) 11/04/2021 0247   HGB 13.2 12/03/2022 1525   HCT 43.2 12/03/2022 1525   PLT 418 12/03/2022 1525   MCV 82 12/03/2022 1525   MCH 25.0 (L) 12/03/2022 1525   MCH 23.9 (L) 11/04/2021 0247   MCHC 30.6 (L) 12/03/2022 1525   MCHC 30.6 11/04/2021 0247   RDW 20.5 (H) 12/03/2022 1525   LYMPHSABS 1.8 04/28/2020 0701   MONOABS 1.3 (H) 04/28/2020 0701   EOSABS 0.1 04/28/2020 0701   BASOSABS 0.0 04/28/2020 0701    BMET    Component Value Date/Time   NA 145 (H) 12/03/2022 1525   K 4.2 12/03/2022 1525   CL 104  12/03/2022 1525   CO2 24 12/03/2022 1525   GLUCOSE 77 12/03/2022 1525   GLUCOSE 205 (H) 11/04/2021 0247   BUN 12 12/03/2022 1525   CREATININE 0.61 12/03/2022 1525   CALCIUM  9.4 12/03/2022 1525   GFRNONAA >60 11/04/2021 0247   GFRAA >60 03/16/2019 0422    BNP  Component Value Date/Time   BNP 53.9 03/12/2019 0849    ProBNP No results found for: PROBNP  Imaging: No results found.  Administration History     None          Latest Ref Rng & Units 01/15/2022   12:58 PM  PFT Results  FVC-Pre L 3.09   FVC-Predicted Pre % 116   FVC-Post L 3.05   FVC-Predicted Post % 115   Pre FEV1/FVC % % 86   Post FEV1/FCV % % 88   FEV1-Pre L 2.65   FEV1-Predicted Pre % 133   FEV1-Post L 2.69     No results found for: NITRICOXIDE   Assessment & Plan:   Assessment & Plan    No follow-ups on file.  Candis Dandy, PA-C 12/08/2023

## 2023-12-09 ENCOUNTER — Ambulatory Visit (HOSPITAL_BASED_OUTPATIENT_CLINIC_OR_DEPARTMENT_OTHER)

## 2023-12-15 DIAGNOSIS — Z9181 History of falling: Secondary | ICD-10-CM | POA: Diagnosis not present

## 2023-12-15 DIAGNOSIS — E1142 Type 2 diabetes mellitus with diabetic polyneuropathy: Secondary | ICD-10-CM | POA: Diagnosis not present

## 2023-12-28 ENCOUNTER — Other Ambulatory Visit (HOSPITAL_BASED_OUTPATIENT_CLINIC_OR_DEPARTMENT_OTHER): Payer: Self-pay

## 2023-12-28 ENCOUNTER — Ambulatory Visit (HOSPITAL_BASED_OUTPATIENT_CLINIC_OR_DEPARTMENT_OTHER): Payer: Self-pay

## 2023-12-28 DIAGNOSIS — R911 Solitary pulmonary nodule: Secondary | ICD-10-CM

## 2023-12-28 NOTE — Progress Notes (Signed)
 @  Patient ID: Kathryn Bruce, female    DOB: 1945/05/24, 78 y.o.   MRN: [id number]   Chest CT completed and demonstrates an increase in size of nodule to 12mm from 11mm (02/2022); was 10mm on imaging from 10/2020.  Likely benign, but does continue to increase in size over time; plan for PET scan to evaluate slowly growing nodule.  Candis Dandy, PA-C 12/28/2023

## 2024-01-08 ENCOUNTER — Ambulatory Visit (HOSPITAL_COMMUNITY)
Admission: RE | Admit: 2024-01-08 | Discharge: 2024-01-08 | Disposition: A | Discharge status: Home / Self Care | Source: Ambulatory Visit

## 2024-01-08 DIAGNOSIS — R911 Solitary pulmonary nodule: Secondary | ICD-10-CM | POA: Diagnosis not present

## 2024-01-08 LAB — GLUCOSE, CAPILLARY: Glucose-Capillary: 119 mg/dL — ABNORMAL HIGH (ref 70–99)

## 2024-01-08 MED ORDER — FLUDEOXYGLUCOSE F - 18 (FDG) INJECTION
9.9600 | Freq: Once | INTRAVENOUS | Status: AC | PRN
  Administered 2024-01-08: 9.96 via INTRAVENOUS

## 2024-01-12 ENCOUNTER — Ambulatory Visit (HOSPITAL_BASED_OUTPATIENT_CLINIC_OR_DEPARTMENT_OTHER): Payer: Self-pay

## 2024-01-12 NOTE — Progress Notes (Signed)
Left pt message to schedule.

## 2024-01-13 ENCOUNTER — Ambulatory Visit (INDEPENDENT_AMBULATORY_CARE_PROVIDER_SITE_OTHER)

## 2024-01-13 ENCOUNTER — Telehealth (HOSPITAL_BASED_OUTPATIENT_CLINIC_OR_DEPARTMENT_OTHER): Payer: Self-pay

## 2024-01-13 ENCOUNTER — Encounter (HOSPITAL_BASED_OUTPATIENT_CLINIC_OR_DEPARTMENT_OTHER): Payer: Self-pay

## 2024-01-13 VITALS — BP 135/57 | Ht 63.0 in | Wt 196.0 lb

## 2024-01-13 DIAGNOSIS — R911 Solitary pulmonary nodule: Secondary | ICD-10-CM | POA: Diagnosis not present

## 2024-01-13 NOTE — Telephone Encounter (Signed)
 Please schedule the following:  Provider performing procedure:Alghanim Diagnosis: lung nodule Which side if for nodule / mass? left Procedure: nav bronch  Has patient been spoken to by Provider and given informed consent? yes Anesthesia: general Do you need Fluro? yes Duration of procedure: 1.5 hrs Date: 02/11/2024 Alternate Date: 02/25/2024  Time:  PMyes Location: [facility] Does patient have OSA? no DM? yes Or Latex allergy? no Medication Restriction/ Anticoagulate/Antiplatelet: stop Plavix  5 days before procedure; stop Eliquis  2 days before procedure Pre-op Labs Ordered:determined by Anesthesia Imaging request: super d  (If, SuperDimension CT Chest, please have STAT courier sent to ENDO)

## 2024-01-13 NOTE — Patient Instructions (Signed)
 Medication Restriction/ Anticoagulate/Antiplatelet: stop Plavix  5 days before procedure; stop Eliquis  2 days before procedure  Will call to schedule bronchoscopy- see above regarding medication information.  Complete Chest CT prior to bronchoscopy  Follow up 2 weeks after bronchoscopy completed.

## 2024-01-13 NOTE — Progress Notes (Signed)
 @Patient  ID: Kathryn Bruce, female    DOB: Nov 29, 1945, 78 y.o.   MRN: [id number]  Chief Complaint  Patient presents with   Follow-up    Pet scan     Referring provider: Regino Slater, MD  HPI: Discussed the use of AI scribe software for clinical note transcription with the patient, who gave verbal consent to proceed.  History of Present Illness Kathryn Bruce is a 78 year old female who presents for evaluation of her recent PET scan results. She is accompanied by Adina, her family member.  She has had a lung nodule for several years, which has been stable but has recently increased in size, prompting a PET scan. The scan showed some uptake, raising concerns about potential malignancy.  No significant respiratory symptoms such as increased shortness of breath, cough, hemoptysis, fever, or chills. However, she notes an unintentional weight loss of three to four pounds recently, attributed to decreased appetite and reduced food intake.  Her current medication includes albuterol , which she has not needed to use recently but keeps available if necessary.  Last OV 10/28/2023: Kathryn Bruce is a 78 y/o female former smoker with PMH of CAD s/p PCI, emphysema, DM2, obesity, and Afib who presents today for evaluation of cough.  She was last seen in our clinic in May 2024.  Pulmonary function test completed in November 2023 were reviewed and were noted to be normal.  She did have a low-dose chest CT for lung cancer screening in December 2023 and is inquiring about this today.  Results of this test were reviewed with her which did demonstrate several small pulmonary nodules.  She reports that she did not follow-up after that and did not fill the prescription for albuterol  inhaler either.  She states that she does not have limitations in completing her ADLs due to dyspnea.  She has had a cough for the last 3 weeks.  She reports production of clear to white sputum at times.  She has also had some  fatigue associated with this.  She denies any fever, chills, night sweats, weight loss, hemoptysis or chest pain.  She denies any sick contacts.  She denies issues with seasonal allergies and denies any sinus congestion.   She does also complain of chronic daytime sleepiness requiring her to take naps throughout the day.  She also reports that she snores at night.  She reports never having a sleep evaluation.  She states that the symptoms are not new.   TEST/EVENTS : PET CT 01/08/2024:  IMPRESSION: 1. Metabolic activity associated with the left lower lobe pulmonary nodule is mild however; consider tissue sampling. 2. No evidence of metastatic disease.  No Known Allergies   There is no immunization history on file for this patient.  Past Medical History:  Diagnosis Date   Acute ST elevation myocardial infarction (STEMI) of anterior wall (HCC) 04/28/2020   Aortic atherosclerosis 10/05/2017   Arthritis    spine, knees (10/06/2017)   CAD (coronary artery disease), native coronary artery 10/05/2017   10/06/2016 cardiac catheterization showing 99% LAD stenosis, 20 to 30% RCA and circumflex stenosis, 3.5 x 15 mm Sierra stent in proximal LAD by Dr. Verlin   Cancer [facility])    SKIN CANCDER REMOVED FROM NOSE    Chronic lower back pain    Diabetes mellitus type 2, noninsulin dependent (HCC) 10/05/2017   GERD (gastroesophageal reflux disease)    Hidradenitis    Hypercholesteremia    Hyperlipidemia 10/05/2017   Hypertension  Obesity (BMI 30-39.9) 10/05/2017   Osteoarthritis (arthritis due to wear and tear of joints)    right knee   Sleep apnea    STOP BANG SCORE 5   Tobacco use 09/05/2011   Type II diabetes mellitus (HCC)     Tobacco History: Social History   Tobacco Use  Smoking Status Former   Current packs/day: 0.00   Average packs/day: 0.5 packs/day for 42.0 years (21.0 ttl pk-yrs)   Types: Cigarettes   Start date: 09/07/1977   Quit date: 09/08/2019   Years since quitting: 4.3   Smokeless Tobacco Never   Counseling given: Not Answered   Outpatient Medications Prior to Visit  Medication Sig Dispense Refill   albuterol  (VENTOLIN  HFA) 108 (90 Base) MCG/ACT inhaler Inhale 2 puffs into the lungs every 6 (six) hours as needed for wheezing or shortness of breath. 8 g 6   atorvastatin  (LIPITOR ) 80 MG tablet TAKE 1 TABLET(80 MG) BY MOUTH DAILY 90 tablet 2   blood glucose meter kit and supplies KIT Dispense based on patient and insurance preference. Use up to four times daily as directed. 1 each 0   clopidogrel  (PLAVIX ) 75 MG tablet TAKE 1 TABLET(75 MG) BY MOUTH DAILY 90 tablet 1   ELIQUIS  5 MG TABS tablet TAKE 1 TABLET(5 MG) BY MOUTH TWICE DAILY 60 tablet 5   ezetimibe  (ZETIA ) 10 MG tablet TAKE 1 TABLET(10 MG) BY MOUTH DAILY 90 tablet 1   JARDIANCE  25 MG TABS tablet Take 25 mg by mouth daily.     metFORMIN (GLUCOPHAGE) 500 MG tablet Take 1,000 mg by mouth 2 (two) times daily with a meal.     metoprolol  tartrate (LOPRESSOR ) 25 MG tablet Take 1 tablet (25 mg total) by mouth 2 (two) times daily. 180 tablet 3   nitroGLYCERIN  (NITROSTAT ) 0.4 MG SL tablet Place 1 tablet (0.4 mg total) under the tongue every 5 (five) minutes x 3 doses as needed for chest pain. 25 tablet 12   pantoprazole  (PROTONIX ) 40 MG tablet Take 40 mg by mouth every morning.     valsartan  (DIOVAN ) 320 MG tablet Take 1 tablet (320 mg total) by mouth daily. 90 tablet 3   guaiFENesin  (MUCINEX ) 600 MG 12 hr tablet Take 1 tablet (600 mg total) by mouth 2 (two) times daily as needed. (Patient not taking: Reported on 12/02/2023) 30 tablet 1   omeprazole (PRILOSEC) 40 MG capsule Take 40 mg by mouth daily. (Patient not taking: Reported on 12/02/2023)  1   No facility-administered medications prior to visit.     Review of Systems: as per HPI  Constitutional:   No  weight loss, night sweats,  Fevers, chills, fatigue, or  lassitude.  HEENT:   No headaches,  Difficulty swallowing,  Tooth/dental problems, or  Sore  throat,                No sneezing, itching, ear ache, nasal congestion, post nasal drip,   CV:  No chest pain,  Orthopnea, PND, swelling in lower extremities, anasarca, dizziness, palpitations, syncope.   GI  No heartburn, indigestion, abdominal pain, nausea, vomiting, diarrhea, change in bowel habits, loss of appetite, bloody stools.   Resp: No shortness of breath with exertion or at rest.  No excess mucus, no productive cough,  No non-productive cough,  No coughing up of blood.  No change in color of mucus.  No wheezing.  No chest wall deformity  Skin: no rash or lesions.  GU: no dysuria, change in color of urine,  no urgency or frequency.  No flank pain, no hematuria   MS:  No joint pain or swelling.  No decreased range of motion.  No back pain.    Physical Exam  BP (!) 135/57   Ht 5' 3 (1.6 m)   Wt 196 lb (88.9 kg)   SpO2 99%   BMI 34.72 kg/m   GEN: A/Ox3; pleasant , NAD, well nourished    HEENT:  [place]/AT,  EACs-clear, TMs-wnl, NOSE-clear, THROAT-clear, no lesions, no postnasal drip or exudate noted.   NECK:  Supple w/ fair ROM; no JVD; normal carotid impulses w/o bruits; no thyromegaly or nodules palpated; no lymphadenopathy.    RESP  Clear  P & A; w/o, wheezes/ rales/ or rhonchi. no accessory muscle use, no dullness to percussion  CARD:  RRR, no m/r/g, no peripheral edema, pulses intact, no cyanosis or clubbing.  GI:   Soft & nt; nml bowel sounds; no organomegaly or masses detected.   Musco: Warm bil, no deformities or joint swelling noted.   Neuro: alert, no focal deficits noted.    Skin: Warm, no lesions or rashes    Lab Results:  CBC    Component Value Date/Time   WBC 9.2 12/03/2022 1525   WBC 12.8 (H) 11/04/2021 0247   RBC 5.29 (H) 12/03/2022 1525   RBC 5.31 (H) 11/04/2021 0247   HGB 13.2 12/03/2022 1525   HCT 43.2 12/03/2022 1525   PLT 418 12/03/2022 1525   MCV 82 12/03/2022 1525   MCH 25.0 (L) 12/03/2022 1525   MCH 23.9 (L) 11/04/2021 0247    MCHC 30.6 (L) 12/03/2022 1525   MCHC 30.6 11/04/2021 0247   RDW 20.5 (H) 12/03/2022 1525   LYMPHSABS 1.8 04/28/2020 0701   MONOABS 1.3 (H) 04/28/2020 0701   EOSABS 0.1 04/28/2020 0701   BASOSABS 0.0 04/28/2020 0701    BMET    Component Value Date/Time   NA 145 (H) 12/03/2022 1525   K 4.2 12/03/2022 1525   CL 104 12/03/2022 1525   CO2 24 12/03/2022 1525   GLUCOSE 77 12/03/2022 1525   GLUCOSE 205 (H) 11/04/2021 0247   BUN 12 12/03/2022 1525   CREATININE 0.61 12/03/2022 1525   CALCIUM  9.4 12/03/2022 1525   GFRNONAA >60 11/04/2021 0247   GFRAA >60 03/16/2019 0422    BNP    Component Value Date/Time   BNP 53.9 03/12/2019 0849    ProBNP No results found for: PROBNP  Imaging: NM PET Image Initial (PI) Skull Base To Thigh Result Date: 01/11/2024 EXAM: PET AND CT SKULL BASE TO MID THIGH 01/08/2024 11:16:43 AM TECHNIQUE: RADIOPHARMACEUTICAL: 9.96 mCi F-18 FDG Uptake time 60 minutes. Glucose level 119 mg/dl. PET imaging was acquired from the base of the skull to the mid thighs. Non-contrast enhanced computed tomography was obtained for attenuation correction and anatomic localization. COMPARISON: CT 12/03/2023 CLINICAL HISTORY: Lung nodule, > 8mm. FINDINGS: HEAD AND NECK: No metabolically active cervical lymphadenopathy. CHEST: Nodule in the left lower lobe measures 11 mm on image 84. The nodule has mild metabolic activity with SUV max 2.9. This is similar to background blood pool activity. No hypermetabolic lymph nodes. Coronary artery calcifications. Atherosclerotic calcification of the aorta. ABDOMEN AND PELVIS: No metabolically active intraperitoneal mass. No metabolically active lymphadenopathy. Physiologic activity within the gastrointestinal and genitourinary systems. No obstructive disease in the abdomen and pelvis. Uterus normal. BONES AND SOFT TISSUE: No abnormal FDG activity localizes to the bones. No metabolically active aggressive osseous lesion. No skeletal metastasis.  Degenerative changes  in the lumbar spine. IMPRESSION: 1. Metabolic activity associated with the left lower lobe pulmonary nodule is mild however; consider tissue sampling. 2. No evidence of metastatic disease. Electronically signed by: Norleen Boxer MD 01/11/2024 04:36 PM EST RP Workstation: [id number]    Administration History     None          Latest Ref Rng & Units 01/15/2022   12:58 PM  PFT Results  FVC-Pre L 3.09   FVC-Predicted Pre % 116   FVC-Post L 3.05   FVC-Predicted Post % 115   Pre FEV1/FVC % % 86   Post FEV1/FCV % % 88   FEV1-Pre L 2.65   FEV1-Predicted Pre % 133   FEV1-Post L 2.69     No results found for: NITRICOXIDE   Assessment & Plan:   Assessment & Plan Lung nodule   Assessment and Plan Assessment & Plan Pulmonary nodule with interval growth Nodule growth and mild uptake on PET scan suggests possible malignancy. - Schedule bronchoscopy and biopsy for December 5th. - Order CT scan within 30 days of bronchoscopy for navigation.   Return in about 2 months (around 03/14/2024) for bronch follow up.  Candis Dandy, PA-C 01/13/2024

## 2024-01-14 ENCOUNTER — Encounter: Payer: Self-pay | Admitting: Pulmonary Disease

## 2024-01-14 NOTE — Telephone Encounter (Signed)
 Sending to [employer] to auth. LVM for pt and mailed out letter.

## 2024-01-15 DIAGNOSIS — H353131 Nonexudative age-related macular degeneration, bilateral, early dry stage: Secondary | ICD-10-CM | POA: Diagnosis not present

## 2024-01-15 DIAGNOSIS — E119 Type 2 diabetes mellitus without complications: Secondary | ICD-10-CM | POA: Diagnosis not present

## 2024-01-15 DIAGNOSIS — H0100A Unspecified blepharitis right eye, upper and lower eyelids: Secondary | ICD-10-CM | POA: Diagnosis not present

## 2024-01-15 DIAGNOSIS — H0100B Unspecified blepharitis left eye, upper and lower eyelids: Secondary | ICD-10-CM | POA: Diagnosis not present

## 2024-01-27 ENCOUNTER — Ambulatory Visit (HOSPITAL_BASED_OUTPATIENT_CLINIC_OR_DEPARTMENT_OTHER): Admission: RE | Admit: 2024-01-27 | Source: Ambulatory Visit

## 2024-02-08 ENCOUNTER — Other Ambulatory Visit: Payer: Self-pay

## 2024-02-08 ENCOUNTER — Encounter (HOSPITAL_COMMUNITY): Payer: Self-pay | Admitting: Pulmonary Disease

## 2024-02-08 ENCOUNTER — Ambulatory Visit (HOSPITAL_BASED_OUTPATIENT_CLINIC_OR_DEPARTMENT_OTHER): Admission: RE | Admit: 2024-02-08 | Discharge: 2024-02-08 | Disposition: A | Source: Ambulatory Visit

## 2024-02-08 DIAGNOSIS — R911 Solitary pulmonary nodule: Secondary | ICD-10-CM | POA: Insufficient documentation

## 2024-02-08 DIAGNOSIS — K449 Diaphragmatic hernia without obstruction or gangrene: Secondary | ICD-10-CM | POA: Diagnosis not present

## 2024-02-08 NOTE — Progress Notes (Signed)
 PCP - Dr. Elfrieda Koirala Cardiologist - Dr. Shelda Bruckner  PPM/ICD - denies   Chest x-ray - 11/04/21 EKG - 06/03/23 Stress Test - 10-15 years ago per pt ECHO - 12/02/21 Cardiac Cath - 04/28/20  CPAP - denies  DM- pt does not check CBG and does not know typical fasting levels  Blood Thinner Instructions: STOP Plavix  5 days before surgery. Pt took this morning, MD notified. Stop Eliquis  2 days before surgery. Last dose 12/1 Aspirin  Instructions: n/a  ERAS Protcol - no, NPO  COVID TEST- n/a  Anesthesia review: yes, cardiac hx  Patient verbally denies any shortness of breath, fever, cough and chest pain during phone call  PHONE CALL WAS CUT SHORT DUE TO PT TAKING PLAVIX  THIS MORNING. She has now been rescheduled to 12/18. Pt instructed last dose Plavix  should be 12/12. Last dose Eliquis  should be 12/15. Last dose Jardiance  should be 12/14. Pt expressed understanding.

## 2024-02-08 NOTE — Progress Notes (Signed)
 Notified Dr. Catherine that pt took her Plavix  this morning. Surgery is on 12/4

## 2024-02-11 ENCOUNTER — Ambulatory Visit (HOSPITAL_BASED_OUTPATIENT_CLINIC_OR_DEPARTMENT_OTHER)

## 2024-02-11 DIAGNOSIS — R911 Solitary pulmonary nodule: Secondary | ICD-10-CM | POA: Insufficient documentation

## 2024-02-17 ENCOUNTER — Telehealth: Payer: Self-pay

## 2024-02-18 NOTE — Telephone Encounter (Signed)
 error

## 2024-02-19 ENCOUNTER — Ambulatory Visit: Admitting: Acute Care

## 2024-02-24 ENCOUNTER — Encounter (HOSPITAL_COMMUNITY): Payer: Self-pay | Admitting: Pulmonary Disease

## 2024-02-24 ENCOUNTER — Encounter (HOSPITAL_BASED_OUTPATIENT_CLINIC_OR_DEPARTMENT_OTHER): Payer: Self-pay

## 2024-02-24 ENCOUNTER — Other Ambulatory Visit: Payer: Self-pay

## 2024-02-24 NOTE — Progress Notes (Signed)
 Anesthesia Chart Review: Same day workup  78 year old female follows with cardiology for history of paroxysmal AF, CAD with NSTEMI in 09/2017 s/p DES to proximal LAD followed by STEMI 04/2020 due to in-stent thrombosis, HTN, HLD.  Seen by Dr. Lonni 12/02/2023.  Noted that she had been changed from ticagrelor  to clopidogrel  after the initiation of Eliquis  for atrial fibrillation.  Per note, with history of stent thrombosis, would make sure she is always on antiplatelet. Previously for colonoscopy held apixaban  and clopidogrel  but placed her on aspirin  81 mg while she off clopidogrel . Ok to repeat this regimen for future colonoscopies/procedures.  She has a left lower lobe nodule which has been followed for several years and has been stable but recently increased in size, prompting PET scan.  Scans showed some uptake, raising concern about potential malignancy.  Other pertinent history includes non-insulin -dependent DM2 (A1c 6.9 on 10/20/2023), GERD on PPI.  It appears the patient has been off Eliquis  and Plavix  as instructed by pulmonology but there is no documentation of her being instructed to take 81 mg ASA while off Plavix .  Given her history of stent thrombosis and cardiology's recent recommendation to always be on antiplatelet agent, I reviewed the chart with anesthesiologists Dr. Leopoldo and Dr. Dorethea.  They advised patient needs to have cardiology input regarding holding antiplatelet agents prior to proceeding with surgery.  I relayed this recommendation to Dr. Catherine.   EKG 06/03/2023: NSR.  Rate 62.  Low septal forces, unchanged.  CT super D chest 02/08/2024: IMPRESSION: 1. Stable 1.4 x 1.1 cm left lower lobe pulmonary nodule and scattered pulmonary micronodules bilaterally. 2. Multiple stable prominent but not enlarged mediastinal lymph nodes.  TTE 12/02/2021: 1. Left ventricular ejection fraction, by estimation, is 60 to 65%. The  left ventricle has normal function. The left  ventricle has no regional  wall motion abnormalities. There is mild concentric left ventricular  hypertrophy. Left ventricular diastolic  parameters are consistent with Grade I diastolic dysfunction (impaired  relaxation). Elevated left atrial pressure.   2. Right ventricular systolic function is normal. The right ventricular  size is normal.   3. Left atrial size was mildly dilated.   4. The mitral valve is normal in structure. Trivial mitral valve  regurgitation. No evidence of mitral stenosis.   5. The aortic valve is tricuspid. There is mild calcification of the  aortic valve. There is mild thickening of the aortic valve. Aortic valve  regurgitation is not visualized. Aortic valve sclerosis is present, with  no evidence of aortic valve stenosis.   Comparison(s): Prior images reviewed side by side. Prior study was  performed with Definity  contrast and identified apical akinesis. The  current study was performed without contrast and apical imaging is  suboptimal. Nevertheless, there appears to be  improved apical wall motion.   Cath/PCI 04/28/20: Dist Cx lesion is 30% stenosed. 1st Mrg lesion is 20% stenosed. Prox RCA lesion is 10% stenosed. Dist LAD lesion is 100% stenosed. Post intervention, there is a 0% residual stenosis. Prox LAD to Mid LAD lesion is 80% stenosed. The left ventricular systolic function is normal. LV end diastolic pressure is normal. The left ventricular ejection fraction is greater than 65% by visual estimate.   Acute ST segment elevation myocardial infarction secondary to thrombotic subtotal occlusion within the previously placed proximal LAD stent with probable apical distal embolization.   Mild concomitant nonobstructive CAD involving the left circumflex 20% mild narrowings in the OM1 vessel and AV groove and mild irregularity in  the mid RCA.   Successful PCI to the thrombotic lesion in the previously placed Xience 3.5 x 15 mm stent treated with pliant  balloon dilatation up to 3.5 mm.  Residual stenosis 0 with brisk TIMI-3 flow.  However there is still probable abrupt cut off at the very apex of the LAD due to initial thrombus embolization   Hyperdynamic LV function with EF estimate greater than 65% without focal wall motion abnormality. LVEDP 12 mm.   RECOMMENDATION: DAPT indefinitely in this patient who had previously undergone stenting of her proximal LAD with subsequent Plavix  discontinuation.  Continue Aggrastat  18 hours post procedure.  Aggressive lipid-lowering therapy and optimal blood pressure control.  Smoking cessation is essential.    Lynwood Geofm RIGGERS [facility] Short Stay Center/Anesthesiology Phone [phone] 02/24/2024 4:35 PM

## 2024-02-24 NOTE — Progress Notes (Signed)
 PCP - Dr. Elfrieda Haggard Cardiologist - Dr. Shelda Bruckner  Chest x-ray - 11/04/21 EKG - 06/03/23 Stress Test - 10-15 years ago per pt ECHO - 12/02/21 Cardiac Cath - 04/28/20   Checks Blood Sugar Does not check   Jardiance  LD 12/14  Blood Thinner Instructions: STOP Plavix  5 days before surgery LD 12/12 Stop Eliquis  2 days before surgery LD 12/15  Anesthesia review: Y  Patient verbally denies any shortness of breath, fever, cough and chest pain during phone call   -------------  SDW INSTRUCTIONS given:  Your procedure is scheduled on Thurs, Dec 18th.  Report to [facility] Main Entrance A at 0845 A.M., and check in at the Admitting office.  Call this number if you have problems the morning of surgery:  [phone]   Remember:  Do not eat or drink after midnight the night before your surgery    Take these medicines the morning of surgery with A SIP OF WATER  ezetimibe  (ZETIA )  metoprolol  tartrate (LOPRESSOR )  VENTOLIN -if needed (Please bring on the day of surgery) pantoprazole  (PROTONIX )-if needed  **Metformin** DO NOT take the day of surgery **JARDIANCE ** Stop 3 days prior to your surgery date.   ** PLEASE check your blood sugar the morning of your surgery when you wake up and every 2 hours until you get to the Short Stay unit.  If your blood sugar is less than 70 mg/dL, you will need to treat for low blood sugar: Do not take insulin . Treat a low blood sugar (less than 70 mg/dL) with  cup of clear juice (cranberry or apple), 4 glucose tablets, OR glucose gel. Recheck blood sugar in 15 minutes after treatment (to make sure it is greater than 70 mg/dL). If your blood sugar is not greater than 70 mg/dL on recheck, call [phone] for further instructions.  As of today, STOP taking any Aspirin  (unless otherwise instructed by your surgeon) Aleve, Naproxen, Ibuprofen, Motrin, Advil, Goody's, BC's, all herbal medications, fish oil, and all vitamins.                       Do not wear jewelry, make up, or nail polish            Do not wear lotions, powders, perfumes/colognes, or deodorant.            Do not shave 48 hours prior to surgery.  Men may shave face and neck.            Do not bring valuables to the hospital.            [facility] is not responsible for any belongings or valuables.  Do NOT Smoke (Tobacco/Vaping) 24 hours prior to your procedure If you use a CPAP at night, you may bring all equipment for your overnight stay.   Contacts, glasses, dentures or bridgework may not be worn into surgery.      For patients admitted to the hospital, discharge time will be determined by your treatment team.   Patients discharged the day of surgery will not be allowed to drive home, and someone needs to stay with them for 24 hours.    Special instructions:   [place]- Preparing For Surgery  Before surgery, you can play an important role. Because skin is not sterile, your skin needs to be as free of germs as possible. You can reduce the number of germs on your skin by washing with CHG (chlorahexidine gluconate) Soap before surgery.  CHG  is an antiseptic cleaner which kills germs and bonds with the skin to continue killing germs even after washing.    Oral Hygiene is also important to reduce your risk of infection.  Remember - BRUSH YOUR TEETH THE MORNING OF SURGERY WITH YOUR REGULAR TOOTHPASTE  Please do not use if you have an allergy to CHG or antibacterial soaps. If your skin becomes reddened/irritated stop using the CHG.  Do not shave (including legs and underarms) for at least 48 hours prior to first CHG shower. It is OK to shave your face.  Please follow these instructions carefully.   Shower the NIGHT BEFORE SURGERY and the MORNING OF SURGERY with DIAL Soap.   Pat yourself dry with a CLEAN TOWEL.  Wear CLEAN PAJAMAS to bed the night before surgery  Place CLEAN SHEETS on your bed the night of your first shower and DO NOT SLEEP WITH  PETS.   Day of Surgery: Please shower morning of surgery  Wear Clean/Comfortable clothing the morning of surgery Do not apply any deodorants/lotions.   Remember to brush your teeth WITH YOUR REGULAR TOOTHPASTE.   Questions were answered. Patient verbalized understanding of instructions.

## 2024-02-25 ENCOUNTER — Encounter (HOSPITAL_COMMUNITY): Admission: RE | Payer: Self-pay | Source: Home / Self Care

## 2024-02-25 ENCOUNTER — Ambulatory Visit (HOSPITAL_COMMUNITY): Admission: RE | Admit: 2024-02-25 | Source: Home / Self Care | Admitting: Pulmonary Disease

## 2024-02-25 ENCOUNTER — Encounter (HOSPITAL_COMMUNITY): Payer: Self-pay | Admitting: Physician Assistant

## 2024-02-25 DIAGNOSIS — R911 Solitary pulmonary nodule: Secondary | ICD-10-CM | POA: Insufficient documentation

## 2024-02-25 SURGERY — VIDEO BRONCHOSCOPY WITH ENDOBRONCHIAL NAVIGATION
Anesthesia: General | Laterality: Left

## 2024-03-07 ENCOUNTER — Ambulatory Visit (HOSPITAL_BASED_OUTPATIENT_CLINIC_OR_DEPARTMENT_OTHER): Admitting: Pulmonary Disease

## 2024-03-08 ENCOUNTER — Ambulatory Visit (HOSPITAL_BASED_OUTPATIENT_CLINIC_OR_DEPARTMENT_OTHER): Admitting: Pulmonary Disease

## 2024-03-08 ENCOUNTER — Encounter (HOSPITAL_BASED_OUTPATIENT_CLINIC_OR_DEPARTMENT_OTHER): Payer: Self-pay | Admitting: Pulmonary Disease

## 2024-03-08 ENCOUNTER — Encounter: Payer: Self-pay | Admitting: Pulmonary Disease

## 2024-03-08 ENCOUNTER — Telehealth: Payer: Self-pay | Admitting: Pulmonary Disease

## 2024-03-08 VITALS — BP 133/73 | HR 64 | Temp 98.1°F | Ht 63.0 in | Wt 198.4 lb

## 2024-03-08 DIAGNOSIS — R911 Solitary pulmonary nodule: Secondary | ICD-10-CM | POA: Diagnosis not present

## 2024-03-08 DIAGNOSIS — E119 Type 2 diabetes mellitus without complications: Secondary | ICD-10-CM

## 2024-03-08 DIAGNOSIS — I2510 Atherosclerotic heart disease of native coronary artery without angina pectoris: Secondary | ICD-10-CM | POA: Diagnosis not present

## 2024-03-08 DIAGNOSIS — Z720 Tobacco use: Secondary | ICD-10-CM

## 2024-03-08 DIAGNOSIS — Z6835 Body mass index (BMI) 35.0-35.9, adult: Secondary | ICD-10-CM | POA: Diagnosis not present

## 2024-03-08 DIAGNOSIS — E669 Obesity, unspecified: Secondary | ICD-10-CM | POA: Diagnosis not present

## 2024-03-08 NOTE — Assessment & Plan Note (Signed)
 Type 2 diabetes mellitus Managed with Jardiance . Plan to stop Jardiance  3 days before biopsy to minimize risk of euglycemic DKA. - Stop Jardiance  3 days before biopsy.

## 2024-03-08 NOTE — Assessment & Plan Note (Signed)
 Coronary artery disease with coronary stent Coronary artery disease with stent. Concerns about blood thinner management due to biopsy. Previous stent thrombosis when off blood thinners. Plan to manage blood thinners carefully to prevent cardiac events during biopsy. - Continue blood thinners until close to biopsy date. - Switch to aspirin  81 mg daily starting January 15th, 2026. - Stop Plavix  5 days before biopsy and Eliquis  2 days before biopsy. - Monitor for chest pain and restart blood thinners if pain occurs. - I sent a message to Dr. Lonni to see if she agrees with plan above and determine need for preoperative evaluation.

## 2024-03-08 NOTE — Patient Instructions (Signed)
" °  VISIT SUMMARY: You came in today to discuss a growing lung nodule that has been monitored since 2017. We reviewed your recent PET scan and planned a biopsy to determine if the nodule is cancerous. We also discussed managing your coronary artery disease and diabetes in preparation for the biopsy.  YOUR PLAN: SOLITARY PULMONARY NODULE, LEFT LOWER LOBE: You have a nodule in your left lower lung that has grown over time and showed slight activity on a recent PET scan, which could indicate cancer. -A biopsy is scheduled for January 23rd, 2026 to determine if the nodule is cancerous. -Start taking aspirin  81 mg daily on January 15th, 2026. -Stop taking Plavix  75 mg on January 18th, 2026. -Stop taking Eliquis  5 mg on January 21st, 2026. -Stop taking Jardiance  on January 20th, 2026. -If you experience chest pain, restart your blood thinners and go to the emergency department. -We will consider radiation therapy if the biopsy confirms cancer.  CORONARY ARTERY DISEASE WITH CORONARY STENT: You have coronary artery disease and a stent, which requires careful management of your blood thinners, especially around the time of your biopsy. -Continue taking your blood thinners until close to the biopsy date. -Switch to aspirin  81 mg daily starting January 15th, 2026. -Stop taking Plavix  5 days before the biopsy and Eliquis  2 days before the biopsy. -Monitor for chest pain and restart your blood thinners if pain occurs.  TYPE 2 DIABETES MELLITUS: You are managing your diabetes with Jardiance . -Stop taking Jardiance  3 days before the biopsy to minimize bleeding risk.   Contains text generated by [employer].  "

## 2024-03-08 NOTE — Progress Notes (Signed)
 "  Established Patient Pulmonology Office Visit   Subjective:  Patient ID: Kathryn Bruce, female    DOB: 08-02-1945  MRN: [id number]  CC:  Chief Complaint  Patient presents with   Follow-up    Lung nodule     HPI  Discussed the use of AI scribe software for clinical note transcription with the patient, who gave verbal consent to proceed.  History of Present Illness Kathryn Bruce is a 78 year old female with coronary artery disease s/p PCI, DM II, Obesity, and atrial fibrillation who presents for evaluation of a growing left lower lobe lung nodule.  She has a left lower lobe lung nodule measuring 1.3 cm by 1 cm, present since at least 2017, with documented growth over time. A PET scan conducted a month ago showed slight uptake in the nodule.  She has a history of coronary artery disease with a stent placement and is on Eliquis  and Plavix . She experienced intermittent right-sided pain when she stopped her blood thinners, which resolved upon resuming them. No current chest pain or shortness of breath. She had past right arm pain during a heart attack.  She is taking Jardiance  for diabetes and has an albuterol  inhaler for occasional shortness of breath, though she rarely uses it. She had a breathing test in 2023 but did not complete all components. Spirometry part looked normal. No known latex allergies.  The patient was seen by PA in office and recommended for navigational bronchoscopy. This has been scheduled twice and cancelled due to anesthesia concern regarding cardiac risk. The patient was seen by cardiology on 12/02/2023 who recommended that patient go on aspirin  81 mg daily if she is going to undergo any procedure similar to a colonoscopy because of history of stent thrombosis.  Symptoms Associated with Lung cancer:   Central Tumor Sx: Dyspnea  Peripheral Tumor Sx: Dyspnea  No Sx of metastasis  Conditions associated with lung cancer & imp to identify prior to bronch:  CVD  Previously had colonoscopy without anesthesia problems  PMH: - CAD - DM - Atrial fibrillation  Important Medications:  - Plavix  75 mg daily - Eliquis  5 mg BID - Jardiance  25 mg  Allergies: none  Social History:  Smoking and [employer] Exposure: Tobacco Smoking 20 PY  No occupational or military specific exposures.  Family History: History of lung cancer: Lung cancer in brother and sister  ASA grade:  ASA 3 - Patient with moderate systemic disease with functional limitations  Karnofsky Performance Status: 9 Able to carry on normal activity, minor signs, or symptoms of disease  ECOG Performance Status: (1) Restricted in physically strenuous activity, ambulatory and able to do work of light nature  ROS   Current Medications[1]      Objective:  BP 133/73 (BP Location: Left Arm, Patient Position: Sitting, Cuff Size: Large)   Pulse 64   Temp 98.1 F (36.7 C)   Ht 5' 3 (1.6 m)   Wt 198 lb 6.4 oz (90 kg)   SpO2 96%   BMI 35.14 kg/m  Wt Readings from Last 3 Encounters:  03/08/24 198 lb 6.4 oz (90 kg)  01/13/24 196 lb (88.9 kg)  12/02/23 200 lb (90.7 kg)   BMI Readings from Last 3 Encounters:  03/08/24 35.14 kg/m  01/13/24 34.72 kg/m  12/02/23 35.43 kg/m   SpO2 Readings from Last 3 Encounters:  03/08/24 96%  01/13/24 99%  12/02/23 91%   Physical Exam General: NAD, alert, WD, WN Eyes: PERRL, no scleral  icterus ENMT: oropharynx clear, good dentition, no oral lesions, mallampati score III Skin: warm, intact, no rashes Neck: JVD flat, ROM and lymph node assessment normal CV: RRR, systolic murmur audible, nl S1 and S2, no peripheral edema Resp: expiratory wheezing scattered throughout her lung fields Neuro: Awake alert oriented to person place time and situation  Diagnostic Review:  Last CBC Lab Results  Component Value Date   WBC 9.2 12/03/2022   HGB 13.2 12/03/2022   HCT 43.2 12/03/2022   MCV 82 12/03/2022   MCH 25.0 (L) 12/03/2022    RDW 20.5 (H) 12/03/2022   PLT 418 12/03/2022   Last metabolic panel Lab Results  Component Value Date   GLUCOSE 77 12/03/2022   NA 145 (H) 12/03/2022   K 4.2 12/03/2022   CL 104 12/03/2022   CO2 24 12/03/2022   BUN 12 12/03/2022   CREATININE 0.61 12/03/2022   EGFR 92 12/03/2022   CALCIUM  9.4 12/03/2022   PROT 6.1 12/03/2022   ALBUMIN 4.2 12/03/2022   LABGLOB 1.9 12/03/2022   BILITOT 0.5 12/03/2022   ALKPHOS 71 12/03/2022   AST 14 12/03/2022   ALT 9 12/03/2022   ANIONGAP 14 11/04/2021   I reviewed all her chest imaging independently. Patient has a well circumbscribed solitary pulmonary nodule in left lower lobe that has been present since 2017. The nodule has been enlarging in size over time. There's a clear bronchus sign on most recent CT chest. PET/CT reviewed and shows slight uptake in LLL solitary pulmonary nodule. Given interval growth, there's a concern for lung cancer; suspect adenocarcinoma.     Assessment & Plan:   Assessment & Plan Solitary pulmonary nodule Solitary pulmonary nodule, left lower lobe Left lower lobe nodule 1.3 cm x 1 cm, smooth borders, interval growth over time since 2017. PET scan mild uptake, possible malignancy. Biopsy recommended. Discussed potential radiation therapy versus surgical resection if cancer confirmed. - Scheduled navigational bronchoscopy for January 23rd, 2026. - Start aspirin  81 mg daily on January 15th, 2026. - Stop Plavix  75 mg on January 18th, 2026. - Stop Eliquis  5 mg on January 21st, 2026. - Stop Jardiance  on January 20th, 2026. - If chest pain occurs, restart blood thinners, call our office, and go to the nearest emergency department. - If unable to perform biopsy safely, will consider referral to radiation therapy for empiric SBRT. Coronary artery disease involving native coronary artery of native heart without angina pectoris Coronary artery disease with coronary stent Coronary artery disease with stent. Concerns about  blood thinner management due to biopsy. Previous stent thrombosis when off blood thinners. Plan to manage blood thinners carefully to prevent cardiac events during biopsy. - Continue blood thinners until close to biopsy date. - Switch to aspirin  81 mg daily starting January 15th, 2026. - Stop Plavix  5 days before biopsy and Eliquis  2 days before biopsy. - Monitor for chest pain and restart blood thinners if pain occurs. - I sent a message to Dr. Lonni to see if she agrees with plan above and determine need for preoperative evaluation. Type 2 diabetes mellitus in patient with obesity (HCC) Type 2 diabetes mellitus Managed with Jardiance . Plan to stop Jardiance  3 days before biopsy to minimize risk of euglycemic DKA. - Stop Jardiance  3 days before biopsy.  Orders Placed This Encounter  Procedures   Procedural/ Surgical Case Request: VIDEO BRONCHOSCOPY WITH ENDOBRONCHIAL NAVIGATION   Pulmonary function test   I spent 51 minutes reviewing patient's chart including prior consultant notes, imaging, and PFTs as  well as face-to-face with the patient, over half in discussion of the diagnosis and the importance of compliance with the treatment plan.  Follow up: 04/13/2024   Dexter Sauser, MD     [1]  Current Outpatient Medications:    albuterol  (VENTOLIN  HFA) 108 (90 Base) MCG/ACT inhaler, Inhale 2 puffs into the lungs every 6 (six) hours as needed for wheezing or shortness of breath., Disp: 8 g, Rfl: 6   atorvastatin  (LIPITOR ) 80 MG tablet, TAKE 1 TABLET(80 MG) BY MOUTH DAILY, Disp: 90 tablet, Rfl: 2   blood glucose meter kit and supplies KIT, Dispense based on patient and insurance preference. Use up to four times daily as directed., Disp: 1 each, Rfl: 0   clopidogrel  (PLAVIX ) 75 MG tablet, TAKE 1 TABLET(75 MG) BY MOUTH DAILY, Disp: 90 tablet, Rfl: 1   ELIQUIS  5 MG TABS tablet, TAKE 1 TABLET(5 MG) BY MOUTH TWICE DAILY, Disp: 60 tablet, Rfl: 5   ezetimibe  (ZETIA ) 10 MG tablet, TAKE 1  TABLET(10 MG) BY MOUTH DAILY, Disp: 90 tablet, Rfl: 1   JARDIANCE  25 MG TABS tablet, Take 25 mg by mouth daily., Disp: , Rfl:    metFORMIN (GLUCOPHAGE) 500 MG tablet, Take 1,000 mg by mouth 2 (two) times daily with a meal., Disp: , Rfl:    metoprolol  tartrate (LOPRESSOR ) 25 MG tablet, Take 1 tablet (25 mg total) by mouth 2 (two) times daily., Disp: 180 tablet, Rfl: 3   nitroGLYCERIN  (NITROSTAT ) 0.4 MG SL tablet, Place 1 tablet (0.4 mg total) under the tongue every 5 (five) minutes x 3 doses as needed for chest pain., Disp: 25 tablet, Rfl: 12   pantoprazole  (PROTONIX ) 40 MG tablet, Take 40 mg by mouth as needed., Disp: , Rfl:    valsartan  (DIOVAN ) 320 MG tablet, Take 1 tablet (320 mg total) by mouth daily., Disp: 90 tablet, Rfl: 3   Potassium 99 MG TABS, Take 1 tablet by mouth daily. (Patient not taking: Reported on 03/08/2024), Disp: , Rfl:   "

## 2024-03-08 NOTE — H&P (View-Only) (Signed)
 "  Established Patient Pulmonology Office Visit   Subjective:  Patient ID: Kathryn Bruce, female    DOB: 08-02-1945  MRN: [id number]  CC:  Chief Complaint  Patient presents with   Follow-up    Lung nodule     HPI  Discussed the use of AI scribe software for clinical note transcription with the patient, who gave verbal consent to proceed.  History of Present Illness Kathryn Bruce is a 78 year old female with coronary artery disease s/p PCI, DM II, Obesity, and atrial fibrillation who presents for evaluation of a growing left lower lobe lung nodule.  She has a left lower lobe lung nodule measuring 1.3 cm by 1 cm, present since at least 2017, with documented growth over time. A PET scan conducted a month ago showed slight uptake in the nodule.  She has a history of coronary artery disease with a stent placement and is on Eliquis  and Plavix . She experienced intermittent right-sided pain when she stopped her blood thinners, which resolved upon resuming them. No current chest pain or shortness of breath. She had past right arm pain during a heart attack.  She is taking Jardiance  for diabetes and has an albuterol  inhaler for occasional shortness of breath, though she rarely uses it. She had a breathing test in 2023 but did not complete all components. Spirometry part looked normal. No known latex allergies.  The patient was seen by PA in office and recommended for navigational bronchoscopy. This has been scheduled twice and cancelled due to anesthesia concern regarding cardiac risk. The patient was seen by cardiology on 12/02/2023 who recommended that patient go on aspirin  81 mg daily if she is going to undergo any procedure similar to a colonoscopy because of history of stent thrombosis.  Symptoms Associated with Lung cancer:   Central Tumor Sx: Dyspnea  Peripheral Tumor Sx: Dyspnea  No Sx of metastasis  Conditions associated with lung cancer & imp to identify prior to bronch:  CVD  Previously had colonoscopy without anesthesia problems  PMH: - CAD - DM - Atrial fibrillation  Important Medications:  - Plavix  75 mg daily - Eliquis  5 mg BID - Jardiance  25 mg  Allergies: none  Social History:  Smoking and [employer] Exposure: Tobacco Smoking 20 PY  No occupational or military specific exposures.  Family History: History of lung cancer: Lung cancer in brother and sister  ASA grade:  ASA 3 - Patient with moderate systemic disease with functional limitations  Karnofsky Performance Status: 9 Able to carry on normal activity, minor signs, or symptoms of disease  ECOG Performance Status: (1) Restricted in physically strenuous activity, ambulatory and able to do work of light nature  ROS   Current Medications[1]      Objective:  BP 133/73 (BP Location: Left Arm, Patient Position: Sitting, Cuff Size: Large)   Pulse 64   Temp 98.1 F (36.7 C)   Ht 5' 3 (1.6 m)   Wt 198 lb 6.4 oz (90 kg)   SpO2 96%   BMI 35.14 kg/m  Wt Readings from Last 3 Encounters:  03/08/24 198 lb 6.4 oz (90 kg)  01/13/24 196 lb (88.9 kg)  12/02/23 200 lb (90.7 kg)   BMI Readings from Last 3 Encounters:  03/08/24 35.14 kg/m  01/13/24 34.72 kg/m  12/02/23 35.43 kg/m   SpO2 Readings from Last 3 Encounters:  03/08/24 96%  01/13/24 99%  12/02/23 91%   Physical Exam General: NAD, alert, WD, WN Eyes: PERRL, no scleral  icterus ENMT: oropharynx clear, good dentition, no oral lesions, mallampati score III Skin: warm, intact, no rashes Neck: JVD flat, ROM and lymph node assessment normal CV: RRR, systolic murmur audible, nl S1 and S2, no peripheral edema Resp: expiratory wheezing scattered throughout her lung fields Neuro: Awake alert oriented to person place time and situation  Diagnostic Review:  Last CBC Lab Results  Component Value Date   WBC 9.2 12/03/2022   HGB 13.2 12/03/2022   HCT 43.2 12/03/2022   MCV 82 12/03/2022   MCH 25.0 (L) 12/03/2022    RDW 20.5 (H) 12/03/2022   PLT 418 12/03/2022   Last metabolic panel Lab Results  Component Value Date   GLUCOSE 77 12/03/2022   NA 145 (H) 12/03/2022   K 4.2 12/03/2022   CL 104 12/03/2022   CO2 24 12/03/2022   BUN 12 12/03/2022   CREATININE 0.61 12/03/2022   EGFR 92 12/03/2022   CALCIUM  9.4 12/03/2022   PROT 6.1 12/03/2022   ALBUMIN 4.2 12/03/2022   LABGLOB 1.9 12/03/2022   BILITOT 0.5 12/03/2022   ALKPHOS 71 12/03/2022   AST 14 12/03/2022   ALT 9 12/03/2022   ANIONGAP 14 11/04/2021   I reviewed all her chest imaging independently. Patient has a well circumbscribed solitary pulmonary nodule in left lower lobe that has been present since 2017. The nodule has been enlarging in size over time. There's a clear bronchus sign on most recent CT chest. PET/CT reviewed and shows slight uptake in LLL solitary pulmonary nodule. Given interval growth, there's a concern for lung cancer; suspect adenocarcinoma.     Assessment & Plan:   Assessment & Plan Solitary pulmonary nodule Solitary pulmonary nodule, left lower lobe Left lower lobe nodule 1.3 cm x 1 cm, smooth borders, interval growth over time since 2017. PET scan mild uptake, possible malignancy. Biopsy recommended. Discussed potential radiation therapy versus surgical resection if cancer confirmed. - Scheduled navigational bronchoscopy for January 23rd, 2026. - Start aspirin  81 mg daily on January 15th, 2026. - Stop Plavix  75 mg on January 18th, 2026. - Stop Eliquis  5 mg on January 21st, 2026. - Stop Jardiance  on January 20th, 2026. - If chest pain occurs, restart blood thinners, call our office, and go to the nearest emergency department. - If unable to perform biopsy safely, will consider referral to radiation therapy for empiric SBRT. Coronary artery disease involving native coronary artery of native heart without angina pectoris Coronary artery disease with coronary stent Coronary artery disease with stent. Concerns about  blood thinner management due to biopsy. Previous stent thrombosis when off blood thinners. Plan to manage blood thinners carefully to prevent cardiac events during biopsy. - Continue blood thinners until close to biopsy date. - Switch to aspirin  81 mg daily starting January 15th, 2026. - Stop Plavix  5 days before biopsy and Eliquis  2 days before biopsy. - Monitor for chest pain and restart blood thinners if pain occurs. - I sent a message to Dr. Lonni to see if she agrees with plan above and determine need for preoperative evaluation. Type 2 diabetes mellitus in patient with obesity (HCC) Type 2 diabetes mellitus Managed with Jardiance . Plan to stop Jardiance  3 days before biopsy to minimize risk of euglycemic DKA. - Stop Jardiance  3 days before biopsy.  Orders Placed This Encounter  Procedures   Procedural/ Surgical Case Request: VIDEO BRONCHOSCOPY WITH ENDOBRONCHIAL NAVIGATION   Pulmonary function test   I spent 51 minutes reviewing patient's chart including prior consultant notes, imaging, and PFTs as  well as face-to-face with the patient, over half in discussion of the diagnosis and the importance of compliance with the treatment plan.  Follow up: 04/13/2024   Dexter Sauser, MD     [1]  Current Outpatient Medications:    albuterol  (VENTOLIN  HFA) 108 (90 Base) MCG/ACT inhaler, Inhale 2 puffs into the lungs every 6 (six) hours as needed for wheezing or shortness of breath., Disp: 8 g, Rfl: 6   atorvastatin  (LIPITOR ) 80 MG tablet, TAKE 1 TABLET(80 MG) BY MOUTH DAILY, Disp: 90 tablet, Rfl: 2   blood glucose meter kit and supplies KIT, Dispense based on patient and insurance preference. Use up to four times daily as directed., Disp: 1 each, Rfl: 0   clopidogrel  (PLAVIX ) 75 MG tablet, TAKE 1 TABLET(75 MG) BY MOUTH DAILY, Disp: 90 tablet, Rfl: 1   ELIQUIS  5 MG TABS tablet, TAKE 1 TABLET(5 MG) BY MOUTH TWICE DAILY, Disp: 60 tablet, Rfl: 5   ezetimibe  (ZETIA ) 10 MG tablet, TAKE 1  TABLET(10 MG) BY MOUTH DAILY, Disp: 90 tablet, Rfl: 1   JARDIANCE  25 MG TABS tablet, Take 25 mg by mouth daily., Disp: , Rfl:    metFORMIN (GLUCOPHAGE) 500 MG tablet, Take 1,000 mg by mouth 2 (two) times daily with a meal., Disp: , Rfl:    metoprolol  tartrate (LOPRESSOR ) 25 MG tablet, Take 1 tablet (25 mg total) by mouth 2 (two) times daily., Disp: 180 tablet, Rfl: 3   nitroGLYCERIN  (NITROSTAT ) 0.4 MG SL tablet, Place 1 tablet (0.4 mg total) under the tongue every 5 (five) minutes x 3 doses as needed for chest pain., Disp: 25 tablet, Rfl: 12   pantoprazole  (PROTONIX ) 40 MG tablet, Take 40 mg by mouth as needed., Disp: , Rfl:    valsartan  (DIOVAN ) 320 MG tablet, Take 1 tablet (320 mg total) by mouth daily., Disp: 90 tablet, Rfl: 3   Potassium 99 MG TABS, Take 1 tablet by mouth daily. (Patient not taking: Reported on 03/08/2024), Disp: , Rfl:   "

## 2024-03-08 NOTE — Telephone Encounter (Signed)
 Please schedule the following:  Provider performing procedure:Marylyn Appenzeller Diagnosis: lung nodule Which side if for nodule / mass? left Procedure: navigational bronch  Has patient been spoken to by Provider and given informed consent? yes Anesthesia: general Do you need Fluro? Yes Duration of procedure: 1.5 hours Date: 04/02/2023 Alternate Date: N/A  Time: ANY Location: [facility] Does patient have OSA? NO DM? YES Or Latex allergy? NO Medication Restriction/ Anticoagulate/Antiplatelet:  - START ASPIRIN  81 MG DAILY ON 03/24/2024 - STOP PLAVIX  75 MG DAILY ON 03/27/2024 - STOP ELIQUIS  5 MG BID on 03/30/2024 - STOP JARDIANCE  ON 03/29/2024 Pre-op Labs Ordered:determined by Anesthesia Imaging request: NONE  (If, [facility] CT Chest, please have STAT courier sent to ENDO)

## 2024-03-08 NOTE — Assessment & Plan Note (Signed)
 Solitary pulmonary nodule, left lower lobe Left lower lobe nodule 1.3 cm x 1 cm, smooth borders, interval growth over time since 2017. PET scan mild uptake, possible malignancy. Biopsy recommended. Discussed potential radiation therapy versus surgical resection if cancer confirmed. - Scheduled navigational bronchoscopy for January 23rd, 2026. - Start aspirin  81 mg daily on January 15th, 2026. - Stop Plavix  75 mg on January 18th, 2026. - Stop Eliquis  5 mg on January 21st, 2026. - Stop Jardiance  on January 20th, 2026. - If chest pain occurs, restart blood thinners, call our office, and go to the nearest emergency department. - If unable to perform biopsy safely, will consider referral to radiation therapy for empiric SBRT.

## 2024-03-09 NOTE — Telephone Encounter (Signed)
 Letter given to Jamie. Routing to [employer] for [employer]

## 2024-03-15 DIAGNOSIS — I480 Paroxysmal atrial fibrillation: Secondary | ICD-10-CM

## 2024-03-15 MED ORDER — APIXABAN 5 MG PO TABS: 5.0000 mg | ORAL_TABLET | Freq: Two times a day (BID) | ORAL | 5 refills | Status: AC

## 2024-03-15 NOTE — Telephone Encounter (Signed)
 Pt last saw Dr Lonni 12/02/23, last labs 11/18/23 Creat 0.74 at [facility] per Care everywhere, age 79, weight 90kg, based on specified criteria pt is on appropriate dosage of Eliquis  5mg  BID for afib.  Will refill rx.

## 2024-03-23 ENCOUNTER — Ambulatory Visit (HOSPITAL_BASED_OUTPATIENT_CLINIC_OR_DEPARTMENT_OTHER): Admitting: Pulmonary Disease

## 2024-03-31 ENCOUNTER — Encounter (HOSPITAL_COMMUNITY): Payer: Self-pay | Admitting: Pulmonary Disease

## 2024-03-31 ENCOUNTER — Other Ambulatory Visit: Payer: Self-pay

## 2024-03-31 NOTE — Anesthesia Preprocedure Evaluation (Addendum)
"                                    Anesthesia Evaluation  Patient identified by MRN, date of birth, ID band Patient awake    Reviewed: Allergy & Precautions, H&P , NPO status , Patient's Chart, lab work & pertinent test results  Airway Mallampati: II   Neck ROM: full    Dental   Pulmonary shortness of breath, former smoker   breath sounds clear to auscultation       Cardiovascular hypertension, + CAD and + Past MI   Rhythm:regular Rate:Normal     Neuro/Psych    GI/Hepatic ,GERD  ,,  Endo/Other  diabetes, Type 2    Renal/GU      Musculoskeletal  (+) Arthritis ,    Abdominal   Peds  Hematology   Anesthesia Other Findings   Reproductive/Obstetrics                              Anesthesia Physical Anesthesia Plan  ASA: 3  Anesthesia Plan: General   Post-op Pain Management:    Induction: Intravenous  PONV Risk Score and Plan: 3 and Ondansetron , Dexamethasone  and Treatment may vary due to age or medical condition  Airway Management Planned: Oral ETT  Additional Equipment:   Intra-op Plan:   Post-operative Plan: Extubation in OR  Informed Consent: I have reviewed the patients History and Physical, chart, labs and discussed the procedure including the risks, benefits and alternatives for the proposed anesthesia with the patient or authorized representative who has indicated his/her understanding and acceptance.     Dental advisory given  Plan Discussed with: CRNA, Anesthesiologist and Surgeon  Anesthesia Plan Comments: (See PAT note by Lynwood Hope, PA-C 02/24/2024.  Surgery postponed at that time due to confusion about holding antiplatelets.  Patient previously cleared by cardiology to hold Plavix  and Eliquis  for procedures with the stipulation that she take 81 mg ASA while off Plavix  due to history of stent thrombosis.  Per Dr. Alois note 03/08/2024, patient will start ASA 81 mg on 03/24/2024, stop Plavix  on  03/27/2024, and stop Eliquis  on 03/30/2024.    )         Anesthesia Quick Evaluation  "

## 2024-03-31 NOTE — Progress Notes (Signed)
 PCP - Regino Slater, MD  Cardiologist - Lonni Slain, MD   PPM/ICD - denies Device Orders - n/a Rep Notified - n/a  Chest x-ray -  Chest CT- 02-15-24 EKG - 06-03-23 Stress Test - 10-15 years ago ECHO - 12-02-21 Cardiac Cath - 04-28-20  CPAP - denies    Dm - per patient she does not check her blood sugars JARDIANCE  Last dose 03-29-24  Blood Thinner Instructions: apixaban  (ELIQUIS ) Last Dose - 03-30-24 clopidogrel  (PLAVIX ) Last dose - 03-27-24 Aspirin  Instructions: per instructions to start 03-24-24. Instructed to reach out to surgeon office for instructions on taking DOS  ERAS Protcol - NPO  COVID TEST- n/a  Anesthesia review: yes,  Patient verbally denies any shortness of breath, fever, cough and chest pain during phone call   -------------  SDW INSTRUCTIONS given:  Your procedure is scheduled on April 01, 2024.  Report to [facility] Main Entrance A at 5:30 A.M., and check in at the Admitting office.  Call this number if you have problems the morning of surgery:  [phone]   Remember:  Do not eat or drink after midnight the night before your surgery      Take these medicines the morning of surgery with A SIP OF WATER  albuterol  (VENTOLIN  HFA) inhaler  atorvastatin  (LIPITOR )  ezetimibe  (ZETIA )  metoprolol  tartrate (LOPRESSOR )  nitroGLYCERIN  (NITROSTAT ) PLEASE CALL [phone] IF USED PRIOR TO SURGERY DATE pantoprazole  (PROTONIX )    As of today, STOP taking any Aspirin  (unless otherwise instructed by your surgeon) Aleve, Naproxen, Ibuprofen, Motrin, Advil, Goody's, BC's, all herbal medications, fish oil, and all vitamins.                      Do not wear jewelry, make up, or nail polish            Do not wear lotions, powders, perfumes/colognes, or deodorant.            Do not shave 48 hours prior to surgery.  Men may shave face and neck.            Do not bring valuables to the hospital.            [facility] is not responsible for any  belongings or valuables.  Do NOT Smoke (Tobacco/Vaping) 24 hours prior to your procedure If you use a CPAP at night, you may bring all equipment for your overnight stay.   Contacts, glasses, dentures or bridgework may not be worn into surgery.      For patients admitted to the hospital, discharge time will be determined by your treatment team.   Patients discharged the day of surgery will not be allowed to drive home, and someone needs to stay with them for 24 hours.    Special instructions:   [place]- Preparing For Surgery  Before surgery, you can play an important role. Because skin is not sterile, your skin needs to be as free of germs as possible. You can reduce the number of germs on your skin by washing with CHG (chlorahexidine gluconate) Soap before surgery.  CHG is an antiseptic cleaner which kills germs and bonds with the skin to continue killing germs even after washing.    Oral Hygiene is also important to reduce your risk of infection.  Remember - BRUSH YOUR TEETH THE MORNING OF SURGERY WITH YOUR REGULAR TOOTHPASTE  Please do not use if you have an allergy to CHG or antibacterial soaps. If your skin becomes reddened/irritated  stop using the CHG.  Do not shave (including legs and underarms) for at least 48 hours prior to first CHG shower. It is OK to shave your face.  Please follow these instructions carefully.   Shower the NIGHT BEFORE SURGERY and the MORNING OF SURGERY with DIAL Soap.   Pat yourself dry with a CLEAN TOWEL.  Wear CLEAN PAJAMAS to bed the night before surgery  Place CLEAN SHEETS on your bed the night of your first shower and DO NOT SLEEP WITH PETS.   Day of Surgery: Please shower morning of surgery  Wear Clean/Comfortable clothing the morning of surgery Do not apply any deodorants/lotions.   Remember to brush your teeth WITH YOUR REGULAR TOOTHPASTE.   Questions were answered. Patient verbalized understanding of instructions.

## 2024-04-01 ENCOUNTER — Ambulatory Visit (HOSPITAL_COMMUNITY): Payer: Self-pay | Admitting: Physician Assistant

## 2024-04-01 ENCOUNTER — Ambulatory Visit (HOSPITAL_COMMUNITY)
Admission: RE | Admit: 2024-04-01 | Discharge: 2024-04-01 | Disposition: A | Discharge status: Home / Self Care | Attending: Pulmonary Disease | Admitting: Pulmonary Disease

## 2024-04-01 ENCOUNTER — Encounter (HOSPITAL_COMMUNITY)
Admission: RE | Disposition: A | Discharge status: Home / Self Care | Payer: Self-pay | Source: Home / Self Care | Attending: Pulmonary Disease

## 2024-04-01 ENCOUNTER — Ambulatory Visit (HOSPITAL_COMMUNITY)

## 2024-04-01 ENCOUNTER — Encounter (HOSPITAL_COMMUNITY): Payer: Self-pay | Admitting: Pulmonary Disease

## 2024-04-01 DIAGNOSIS — Z7902 Long term (current) use of antithrombotics/antiplatelets: Secondary | ICD-10-CM | POA: Insufficient documentation

## 2024-04-01 DIAGNOSIS — Z801 Family history of malignant neoplasm of trachea, bronchus and lung: Secondary | ICD-10-CM | POA: Insufficient documentation

## 2024-04-01 DIAGNOSIS — K219 Gastro-esophageal reflux disease without esophagitis: Secondary | ICD-10-CM | POA: Insufficient documentation

## 2024-04-01 DIAGNOSIS — Z87891 Personal history of nicotine dependence: Secondary | ICD-10-CM | POA: Diagnosis not present

## 2024-04-01 DIAGNOSIS — E119 Type 2 diabetes mellitus without complications: Secondary | ICD-10-CM | POA: Insufficient documentation

## 2024-04-01 DIAGNOSIS — I4891 Unspecified atrial fibrillation: Secondary | ICD-10-CM | POA: Insufficient documentation

## 2024-04-01 DIAGNOSIS — R911 Solitary pulmonary nodule: Secondary | ICD-10-CM | POA: Insufficient documentation

## 2024-04-01 DIAGNOSIS — I10 Essential (primary) hypertension: Secondary | ICD-10-CM | POA: Diagnosis not present

## 2024-04-01 DIAGNOSIS — Z955 Presence of coronary angioplasty implant and graft: Secondary | ICD-10-CM | POA: Diagnosis not present

## 2024-04-01 DIAGNOSIS — M1990 Unspecified osteoarthritis, unspecified site: Secondary | ICD-10-CM | POA: Diagnosis not present

## 2024-04-01 DIAGNOSIS — Z6835 Body mass index (BMI) 35.0-35.9, adult: Secondary | ICD-10-CM | POA: Insufficient documentation

## 2024-04-01 DIAGNOSIS — Z7984 Long term (current) use of oral hypoglycemic drugs: Secondary | ICD-10-CM | POA: Insufficient documentation

## 2024-04-01 DIAGNOSIS — E669 Obesity, unspecified: Secondary | ICD-10-CM | POA: Diagnosis not present

## 2024-04-01 DIAGNOSIS — I2510 Atherosclerotic heart disease of native coronary artery without angina pectoris: Secondary | ICD-10-CM | POA: Insufficient documentation

## 2024-04-01 DIAGNOSIS — Z7901 Long term (current) use of anticoagulants: Secondary | ICD-10-CM | POA: Insufficient documentation

## 2024-04-01 HISTORY — DX: Dyspnea, unspecified: R06.00

## 2024-04-01 LAB — BASIC METABOLIC PANEL WITH GFR
Anion gap: 13 (ref 5–15)
BUN: 17 mg/dL (ref 8–23)
CO2: 23 mmol/L (ref 22–32)
Calcium: 8.6 mg/dL — ABNORMAL LOW (ref 8.9–10.3)
Chloride: 105 mmol/L (ref 98–111)
Creatinine, Ser: 0.6 mg/dL (ref 0.44–1.00)
GFR, Estimated: >60 mL/min
Glucose, Bld: 118 mg/dL — ABNORMAL HIGH (ref 70–99)
Potassium: 3.2 mmol/L — ABNORMAL LOW (ref 3.5–5.1)
Sodium: 141 mmol/L (ref 135–145)

## 2024-04-01 LAB — BODY FLUID CELL COUNT WITH DIFFERENTIAL
Eos, Fluid: 2 %
Lymphs, Fluid: 23 %
Monocyte-Macrophage-Serous Fluid: 1 % — ABNORMAL LOW (ref 50–90)
Neutrophil Count, Fluid: 74 % — ABNORMAL HIGH (ref 0–25)
Total Nucleated Cell Count, Fluid: 134 uL (ref 0–1000)

## 2024-04-01 LAB — GLUCOSE, CAPILLARY
Glucose-Capillary: 121 mg/dL — ABNORMAL HIGH (ref 70–99)
Glucose-Capillary: 126 mg/dL — ABNORMAL HIGH (ref 70–99)

## 2024-04-01 LAB — CBC
HCT: 37.6 % (ref 36.0–46.0)
Hemoglobin: 11.6 g/dL — ABNORMAL LOW (ref 12.0–15.0)
MCH: 24.8 pg — ABNORMAL LOW (ref 26.0–34.0)
MCHC: 30.9 g/dL (ref 30.0–36.0)
MCV: 80.3 fL (ref 80.0–100.0)
Platelets: 333 K/uL (ref 150–400)
RBC: 4.68 MIL/uL (ref 3.87–5.11)
RDW: 16.6 % — ABNORMAL HIGH (ref 11.5–15.5)
WBC: 8.2 K/uL (ref 4.0–10.5)
nRBC: 0 % (ref 0.0–0.2)

## 2024-04-01 MED ORDER — DEXAMETHASONE SOD PHOSPHATE PF 10 MG/ML IJ SOLN
INTRAMUSCULAR | Status: DC | PRN
  Administered 2024-04-01: 4 mg via INTRAVENOUS

## 2024-04-01 MED ORDER — SUGAMMADEX SODIUM 200 MG/2ML IV SOLN
INTRAVENOUS | Status: DC | PRN
  Administered 2024-04-01: 200 mg via INTRAVENOUS

## 2024-04-01 MED ORDER — EPHEDRINE SULFATE-NACL 50-0.9 MG/10ML-% IV SOSY
PREFILLED_SYRINGE | INTRAVENOUS | Status: DC | PRN
  Administered 2024-04-01 (×3): 5 mg via INTRAVENOUS

## 2024-04-01 MED ORDER — PHENYLEPHRINE 80 MCG/ML (10ML) SYRINGE FOR IV PUSH (FOR BLOOD PRESSURE SUPPORT)
PREFILLED_SYRINGE | INTRAVENOUS | Status: DC | PRN
  Administered 2024-04-01 (×3): 80 ug via INTRAVENOUS

## 2024-04-01 MED ORDER — ESMOLOL HCL 100 MG/10ML IV SOLN
INTRAVENOUS | Status: DC | PRN
  Administered 2024-04-01: 20 mg via INTRAVENOUS

## 2024-04-01 MED ORDER — LIDOCAINE 2% (20 MG/ML) 5 ML SYRINGE
INTRAMUSCULAR | Status: DC | PRN
  Administered 2024-04-01: 60 mg via INTRAVENOUS

## 2024-04-01 MED ORDER — ONDANSETRON HCL 4 MG/2ML IJ SOLN
INTRAMUSCULAR | Status: DC | PRN
  Administered 2024-04-01: 4 mg via INTRAVENOUS

## 2024-04-01 MED ORDER — CHLORHEXIDINE GLUCONATE 0.12 % MT SOLN
OROMUCOSAL | Status: AC
  Administered 2024-04-01: 15 mL
  Filled 2024-04-01: qty 15

## 2024-04-01 MED ORDER — PHENYLEPHRINE HCL-NACL 20-0.9 MG/250ML-% IV SOLN
INTRAVENOUS | Status: DC | PRN
  Administered 2024-04-01: 40 ug/min via INTRAVENOUS

## 2024-04-01 MED ORDER — LACTATED RINGERS IV SOLN: INTRAVENOUS | Status: DC

## 2024-04-01 MED ORDER — PROPOFOL 10 MG/ML IV BOLUS
INTRAVENOUS | Status: DC | PRN
  Administered 2024-04-01: 10 mg via INTRAVENOUS
  Administered 2024-04-01: 20 mg via INTRAVENOUS
  Administered 2024-04-01: 80 mg via INTRAVENOUS

## 2024-04-01 MED ORDER — ROCURONIUM BROMIDE 10 MG/ML (PF) SYRINGE
PREFILLED_SYRINGE | INTRAVENOUS | Status: DC | PRN
  Administered 2024-04-01: 50 mg via INTRAVENOUS

## 2024-04-01 NOTE — Op Note (Signed)
 Procedure Note  Patient: Kathryn Bruce  [employer] Cios mobile C-arm was utilized to identify and biopsy LLL SPN.  Needle-in-lesion was confirmed using real-time Cios imaging, and images were uploaded to PACS.      Paula Southerly, MD [facility]

## 2024-04-01 NOTE — Anesthesia Postprocedure Evaluation (Signed)
"   Anesthesia Post Note  Patient: Kathryn Bruce  Procedure(s) Performed: VIDEO BRONCHOSCOPY WITH ENDOBRONCHIAL NAVIGATION (Left) BRONCHOSCOPY, WITH NEEDLE ASPIRATION BIOPSY BRONCHOSCOPY, WITH CRYOBIOPSY IRRIGATION, BRONCHUS     Patient location during evaluation: PACU Anesthesia Type: General Level of consciousness: awake and alert Pain management: pain level controlled Vital Signs Assessment: post-procedure vital signs reviewed and stable Respiratory status: spontaneous breathing, nonlabored ventilation, respiratory function stable and patient connected to nasal cannula oxygen Cardiovascular status: blood pressure returned to baseline and stable Postop Assessment: no apparent nausea or vomiting Anesthetic complications: no   No notable events documented.  Last Vitals:  Vitals:   04/01/24 0940 04/01/24 1030  BP: (!) (P) 140/55 136/61  Pulse: 80 81  Resp: 14 18  Temp:    SpO2: 91% 92%    Last Pain:  Vitals:   04/01/24 0940  TempSrc:   PainSc: (P) 0-No pain                 Starlin Steib S      "

## 2024-04-01 NOTE — Op Note (Signed)
 Video Bronchoscopy with Robotic Assisted Bronchoscopic Navigation   Date of Operation: 04/01/2024   Pre-op Diagnosis: LLL SPN  Post-op Diagnosis: SAME  Surgeon: Paula Southerly, MD  Assistants: N/A  Anesthesia: General endotracheal anesthesia  Operation: Flexible video fiberoptic bronchoscopy with robotic assistance and biopsies.  Estimated Blood Loss: Minimal  Complications: None  Indications and History: Kathryn Bruce is a 79 y.o. female with history of left lower lobe solitary pulmonary nodule.  Recommendation made to achieve a tissue diagnosis via robotic assisted navigational bronchoscopy.  The risks, benefits, complications, treatment options and expected outcomes were discussed with the patient.  The possibilities of pneumothorax, pneumonia, reaction to medication, pulmonary aspiration, perforation of a viscus, bleeding, failure to diagnose a condition and creating a complication requiring transfusion or operation were discussed with the patient who freely signed the consent.    Description of Procedure: The patient was seen in the Preoperative Area, was examined and was deemed appropriate to proceed.  The patient was taken to [facility] Endoscopy room 3, identified as Kathryn Bruce and the procedure verified as Flexible Video Fiberoptic Bronchoscopy.  A Time Out was held and the above information confirmed.   Prior to the date of the procedure a high-resolution CT scan of the chest was performed. Utilizing ION software program a virtual tracheobronchial tree was generated to allow the creation of distinct navigation pathways to the patient's parenchymal abnormalities. After being taken to the operating room general anesthesia was initiated and the patient  was orally intubated. The video fiberoptic bronchoscope was introduced via the endotracheal tube and a general inspection was performed which showed normal right and left lung anatomy. Aspiration of the bilateral mainstems was completed  to remove any remaining secretions. Robotic catheter inserted into patient's endotracheal tube.   Target #1 - LLL SPN: The distinct navigation pathways prepared prior to this procedure were then utilized to navigate to patient's lesion identified on CT scan. The robotic catheter was secured into place and the vision probe was withdrawn.  Lesion location was approximated using fluoroscopy.  Local registration and targeting was performed using [employer] Cios mobile C-arm three-dimensional imaging. Under fluoroscopic guidance transbronchial needle biopsies, and transbronchial cryoprobe biopsies were performed to be sent for cytology and pathology.  Needle-in-lesion was confirmed using Cios mobile C-arm.  A bronchioalveolar lavage was performed in the LLL and sent for microbiology.    Samples Target #1: LLL SPN 1. Transbronchial Wang needle biopsies from LLL SPN 2. Transbronchial cryoprobe biopsies from same 3. Bronchoalveolar lavage from same  Plans:  The patient will be discharged from the PACU to home when recovered from anesthesia and after chest x-ray is reviewed. We will review the cytology, pathology and microbiology results with the patient when they become available. Outpatient followup will be with Dr. Southerly.   Paula Southerly, MD [facility]

## 2024-04-01 NOTE — Discharge Instructions (Signed)
 Flexible Bronchoscopy, Care After This sheet gives you information about how to care for yourself after your test. Your doctor may also give you more specific instructions. If you have problems or questions, contact your doctor. Follow these instructions at home: Eating and drinking When you are wide awake, your numbness is gone and your cough and gag reflexes have come back, you may: Start eating only soft foods. Slowly drink liquids. Six hours after the test, go back to your normal diet. Driving Do not drive for 24 hours if you were given a medicine to help you relax (sedative). Do not drive or use heavy machinery while taking prescription pain medicine. General instructions Take over-the-counter and prescription medicines only as told by your doctor. Return to your normal activities as told. Ask what activities are safe for you. Do not use any products that have nicotine  or tobacco in them. This includes cigarettes and e-cigarettes. If you need help quitting, ask your doctor. Keep all follow-up visits as told by your doctor. This is important. It is very important if you had a tissue sample (biopsy) taken. Get help right away if: You have shortness of breath that gets worse. You get light-headed. You feel like you are going to pass out (faint). You have chest pain. You cough up: More than a little blood. More blood than before. Summary Do not use cigarettes. Do not use e-cigarettes. Seek care in the Emergency Department right away if you have chest pain or shortness of breath. Call or MyChart Message our office for any questions or problems at [phone].    This information is not intended to replace advice given to you by your health care provider. Make sure you discuss any questions you have with your health care provider.  Please RESUME ELIQUIS  THIS EVENING 04/01/2024 Please stop aspirin  81 mg Please start Plavix  on 1/24 Please start Jardiance  on 1/24

## 2024-04-01 NOTE — Transfer of Care (Signed)
 Immediate Anesthesia Transfer of Care Note  Patient: Kathryn Bruce  Procedure(s) Performed: VIDEO BRONCHOSCOPY WITH ENDOBRONCHIAL NAVIGATION (Left) BRONCHOSCOPY, WITH NEEDLE ASPIRATION BIOPSY BRONCHOSCOPY, WITH CRYOBIOPSY IRRIGATION, BRONCHUS  Patient Location: PACU  Anesthesia Type:General  Level of Consciousness: awake, alert , and oriented  Airway & Oxygen Therapy: Patient Spontanous Breathing and Patient connected to nasal cannula oxygen  Post-op Assessment: Report given to RN  Post vital signs: Reviewed and stable  Last Vitals:  Vitals Value Taken Time  BP 126/48 04/01/24 09:20  Temp 36.1 C 04/01/24 09:14  Pulse 82 04/01/24 09:22  Resp 24 04/01/24 09:22  SpO2 91 % 04/01/24 09:22  Vitals shown include unfiled device data.  Last Pain:  Vitals:   04/01/24 0914  TempSrc: Temporal  PainSc: 0-No pain         Complications: No notable events documented.

## 2024-04-01 NOTE — Anesthesia Procedure Notes (Signed)
 Procedure Name: Intubation Date/Time: 04/01/2024 7:53 AM  Performed by: Dianne Burnard RAMAN, CRNAPre-anesthesia Checklist: Patient identified, Emergency Drugs available, Suction available and Patient being monitored Patient Re-evaluated:Patient Re-evaluated prior to induction Oxygen Delivery Method: Circle system utilized Preoxygenation: Pre-oxygenation with 100% oxygen Induction Type: IV induction Ventilation: Mask ventilation without difficulty Laryngoscope Size: Miller and 3 Grade View: Grade I Tube type: Oral Tube size: 8.5 mm Number of attempts: 1 Airway Equipment and Method: Stylet and Oral airway Placement Confirmation: ETT inserted through vocal cords under direct vision, positive ETCO2 and breath sounds checked- equal and bilateral Tube secured with: Tape Dental Injury: Teeth and Oropharynx as per pre-operative assessment

## 2024-04-01 NOTE — Progress Notes (Signed)
 Per dr Catherine, pt is ok to discharge based on the cxr.  Pt walked to bathroom and o2 monitored.  Pt drops to 89 at times during walking.  Dr Catherine ok to discharge home with IS.  Demonstrated proper use of IS to pt with return demo.

## 2024-04-01 NOTE — Addendum Note (Signed)
 Addendum  created 04/01/24 1507 by Dianne Burnard RAMAN, CRNA   Flowsheet accepted, Intraprocedure Flowsheets edited

## 2024-04-01 NOTE — Interval H&P Note (Signed)
 History and Physical Interval Note:  04/01/2024 7:36 AM  Kathryn Bruce  has presented today for surgery, with the diagnosis of lung nodule.  The various methods of treatment have been discussed with the patient and family. After consideration of risks, benefits and other options for treatment, the patient has consented to  Procedures: VIDEO BRONCHOSCOPY WITH ENDOBRONCHIAL NAVIGATION (Left) as a surgical intervention.  The patient's history has been reviewed, patient examined, no change in status, stable for surgery.  I have reviewed the patient's chart and labs.  Questions were answered to the patient's satisfaction.     Vanessia Bokhari

## 2024-04-03 ENCOUNTER — Encounter (HOSPITAL_COMMUNITY): Payer: Self-pay | Admitting: Pulmonary Disease

## 2024-04-04 LAB — ASPERGILLUS ANTIGEN, BAL/SERUM: Aspergillus Ag, BAL/Serum: 0.39 {index} (ref 0.00–0.49)

## 2024-04-04 LAB — CYTOLOGY - NON PAP

## 2024-04-05 LAB — ACID FAST SMEAR (AFB, MYCOBACTERIA): Acid Fast Smear: NEGATIVE

## 2024-04-06 LAB — FUNGUS STAIN

## 2024-04-06 LAB — AEROBIC/ANAEROBIC CULTURE W GRAM STAIN (SURGICAL/DEEP WOUND)
Culture: NO GROWTH
Gram Stain: NONE SEEN

## 2024-04-06 LAB — FUNGAL STAIN REFLEX

## 2024-04-08 LAB — FUNGUS CULTURE RESULT

## 2024-04-08 LAB — FUNGUS CULTURE WITH STAIN

## 2024-04-13 ENCOUNTER — Telehealth (HOSPITAL_BASED_OUTPATIENT_CLINIC_OR_DEPARTMENT_OTHER): Payer: Self-pay | Admitting: Pulmonary Disease

## 2024-04-13 ENCOUNTER — Telehealth (INDEPENDENT_AMBULATORY_CARE_PROVIDER_SITE_OTHER): Admitting: Pulmonary Disease

## 2024-04-13 ENCOUNTER — Ambulatory Visit (HOSPITAL_BASED_OUTPATIENT_CLINIC_OR_DEPARTMENT_OTHER): Admitting: Pulmonary Disease

## 2024-04-13 DIAGNOSIS — R911 Solitary pulmonary nodule: Secondary | ICD-10-CM

## 2024-04-13 NOTE — Progress Notes (Unsigned)
 Virtual Visit via Telephone Note  I connected withNAME@ on 04/14/24 at  4:00 PM EST by telephone and verified that I am speaking with the correct person using two identifiers.  Location: Patient: Home Provider: Office -  Pulmonary - [address] [place], [place] [address]   I discussed the limitations, risks, security and privacy concerns of performing an evaluation and management service by telephone and the availability of in person appointments. I also discussed with the patient that there may be a patient responsible charge related to this service. The patient expressed understanding and agreed to proceed.  Patient consented to consult via telephone: Yes People present and their role in pt care: Pt and grand daughter   History of Present Illness:  ANNELLA Bruce is a 79 year old female with coronary artery disease s/p PCI, DM II, Obesity, and atrial fibrillation on anticoagulation with Eliquis  who presents for evaluation of a growing left lower lobe lung nodule. The nodule has been present since at least 2017 but has been increasing in size slowly over time. PET scan completed showed a max SUV indicating mild avidity. The patient was seen by me on 03/08/2024 and we decided to pursue navigational bronchoscopy which was completed on 04/01/2024. She comes back for post procedure follow up.  She notes that since bronchoscopy she had some hoarse voice and throat pain that lasted 3 days and is now gone. She does have some intermittent episodes of epistaxis and blood tinges in her phlegm. She notes some old blood clots that she coughed up as well. She has no chest pain and dyspnea is at baseline. She resumed her Eliquis  after the procedure, stopped aspirin , and resumed Plavix .  Observations/Objective:  Tobacco Use History[1]  There is no immunization history on file for this patient.  On the video, the patient appears comfortable and in no distress. Oral cavity appears normal. I  was not able to auscultate her lungs and heart as this is a video visit. I did not check for edema.  Cytology LLL: FINAL MICROSCOPIC DIAGNOSIS:   A. LUNG, LLL, FINE NEEDLE ASPIRATION:  - No malignant cells identified   Cell count: 134 whites, 74% PMNs  Aspergillus Ag 0.39  Fungal stain negative, culture pending  AFB stain negative, culture pending  Gram stain with rare WBCs, predominantly PMNs, no organisms, no growth  Assessment and Plan:  No problem-specific Assessment & Plan notes found for this encounter.  #Left Lower Lobe Solitary Pulmonary Nodule: Solitary pulmonary nodule, left lower lobe Left lower lobe nodule 1.3 cm x 1 cm, smooth borders, interval growth over time since 2017. PET scan mild uptake. Navigational bronchoscopy with biopsy negative for malignancy. So far culture data is negative as well. Tool in lesion obtained with cone beam CT. However, noted some challenges with atelectasis during procedure. We will continue to proceed with chest imaging for surveillance. Next scan is DUE 02/2025. Recommend baseline PFTs for baseline lung function in case this nodule continues to grow and patient decides to pursue thoracic surgery for wedge resection.  Follow Up Instructions:   Return in about 10 months (around 02/10/2025).   I discussed the assessment and treatment plan with the patient. The patient was provided an opportunity to ask questions and all were answered. The patient agreed with the plan and demonstrated an understanding of the instructions.   The patient was advised to call back or seek an in-person evaluation if the symptoms worsen or if the condition fails to improve as anticipated.  I am seeing this patient virtually using HIPAA-compliant phone technology. The patient has previously provided full consent to use this technology and understands the risks and benefits of proceeding. I am seeing the patient today from [facility] and from their home located within  [place]. I have spent at least 20 minutes evaluating/treating patient via telemedicine.  PAULA SOUTHERLY, MD    [1]  Social History Tobacco Use  Smoking Status Former   Current packs/day: 0.00   Average packs/day: 0.5 packs/day for 42.0 years (21.0 ttl pk-yrs)   Types: Cigarettes   Start date: 09/07/1977   Quit date: 09/08/2019   Years since quitting: 4.6  Smokeless Tobacco Never

## 2024-04-13 NOTE — Patient Instructions (Addendum)
-   Obtain CT chest in 02/2025 - follow up after CT chest
